# Patient Record
Sex: Male | Born: 1995
Health system: Southern US, Community
[De-identification: ages and names within clinical notes are randomized; demographics above are authoritative.]

## PROBLEM LIST (undated history)

## (undated) ENCOUNTER — Emergency Department (HOSPITAL_BASED_OUTPATIENT_CLINIC_OR_DEPARTMENT_OTHER): Admission: EM | Payer: No Typology Code available for payment source | Source: Home / Self Care

## (undated) DIAGNOSIS — G610 Guillain-Barre syndrome: Secondary | ICD-10-CM

## (undated) DIAGNOSIS — A549 Gonococcal infection, unspecified: Secondary | ICD-10-CM

---
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
## 2018-07-09 ENCOUNTER — Emergency Department (HOSPITAL_COMMUNITY)
Admission: EM | Admit: 2018-07-09 | Discharge: 2018-07-09 | Disposition: A | Discharge status: Home / Self Care | Payer: Medicaid Other | Attending: Emergency Medicine | Admitting: Emergency Medicine

## 2018-07-09 ENCOUNTER — Encounter (HOSPITAL_COMMUNITY): Payer: Self-pay | Admitting: Emergency Medicine

## 2018-07-09 ENCOUNTER — Other Ambulatory Visit: Payer: Self-pay

## 2018-07-09 DIAGNOSIS — R509 Fever, unspecified: Secondary | ICD-10-CM | POA: Diagnosis not present

## 2018-07-09 DIAGNOSIS — J111 Influenza due to unidentified influenza virus with other respiratory manifestations: Secondary | ICD-10-CM | POA: Insufficient documentation

## 2018-07-09 DIAGNOSIS — Z7722 Contact with and (suspected) exposure to environmental tobacco smoke (acute) (chronic): Secondary | ICD-10-CM | POA: Insufficient documentation

## 2018-07-09 DIAGNOSIS — R69 Illness, unspecified: Secondary | ICD-10-CM

## 2018-07-09 LAB — URINALYSIS, ROUTINE W REFLEX MICROSCOPIC
Bilirubin Urine: NEGATIVE
GLUCOSE, UA: NEGATIVE mg/dL
Hgb urine dipstick: NEGATIVE
Ketones, ur: NEGATIVE mg/dL
Leukocytes, UA: NEGATIVE
Nitrite: NEGATIVE
PH: 6 (ref 5.0–8.0)
Protein, ur: NEGATIVE mg/dL
SPECIFIC GRAVITY, URINE: 1.024 (ref 1.005–1.030)

## 2018-07-09 LAB — CBC WITH DIFFERENTIAL/PLATELET
Abs Immature Granulocytes: 0.02 10*3/uL (ref 0.00–0.07)
Basophils Absolute: 0 10*3/uL (ref 0.0–0.1)
Basophils Relative: 0 %
Eosinophils Absolute: 0 10*3/uL (ref 0.0–0.5)
Eosinophils Relative: 0 %
HEMATOCRIT: 45.4 % (ref 39.0–52.0)
HEMOGLOBIN: 15.4 g/dL (ref 13.0–17.0)
Immature Granulocytes: 0 %
LYMPHS PCT: 14 %
Lymphs Abs: 1.2 10*3/uL (ref 0.7–4.0)
MCH: 30.3 pg (ref 26.0–34.0)
MCHC: 33.9 g/dL (ref 30.0–36.0)
MCV: 89.2 fL (ref 80.0–100.0)
Monocytes Absolute: 0.6 10*3/uL (ref 0.1–1.0)
Monocytes Relative: 8 %
Neutro Abs: 6.5 10*3/uL (ref 1.7–7.7)
Neutrophils Relative %: 78 %
Platelets: 223 10*3/uL (ref 150–400)
RBC: 5.09 MIL/uL (ref 4.22–5.81)
RDW: 12.5 % (ref 11.5–15.5)
WBC: 8.4 10*3/uL (ref 4.0–10.5)
nRBC: 0 % (ref 0.0–0.2)

## 2018-07-09 LAB — COMPREHENSIVE METABOLIC PANEL
ALT: 16 U/L (ref 0–44)
AST: 20 U/L (ref 15–41)
Albumin: 4.1 g/dL (ref 3.5–5.0)
Alkaline Phosphatase: 90 U/L (ref 38–126)
Anion gap: 10 (ref 5–15)
BUN: 10 mg/dL (ref 6–20)
CO2: 25 mmol/L (ref 22–32)
Calcium: 9.1 mg/dL (ref 8.9–10.3)
Chloride: 103 mmol/L (ref 98–111)
Creatinine, Ser: 1.1 mg/dL (ref 0.61–1.24)
GFR calc Af Amer: >60 mL/min (ref >60)
GFR calc non Af Amer: >60 mL/min (ref >60)
GLUCOSE: 89 mg/dL (ref 70–99)
Potassium: 3.8 mmol/L (ref 3.5–5.1)
Sodium: 138 mmol/L (ref 135–145)
Total Bilirubin: 1.8 mg/dL — ABNORMAL HIGH (ref 0.3–1.2)
Total Protein: 7.6 g/dL (ref 6.5–8.1)

## 2018-07-09 MED ORDER — OSELTAMIVIR PHOSPHATE 75 MG PO CAPS: 75.0000 mg | ORAL_CAPSULE | Freq: Two times a day (BID) | ORAL | 0 refills | Status: DC

## 2018-07-09 MED ORDER — ACETAMINOPHEN 325 MG PO TABS
650.0000 mg | ORAL_TABLET | Freq: Once | ORAL | Status: AC
  Administered 2018-07-09: 650 mg via ORAL
  Filled 2018-07-09: qty 2

## 2018-07-09 MED ORDER — ONDANSETRON 4 MG PO TBDP: 4.0000 mg | ORAL_TABLET | Freq: Three times a day (TID) | ORAL | 0 refills | Status: DC | PRN

## 2018-07-09 MED ORDER — SODIUM CHLORIDE 0.9 % IV BOLUS
1000.0000 mL | Freq: Once | INTRAVENOUS | Status: AC
  Administered 2018-07-09: 1000 mL via INTRAVENOUS

## 2018-07-09 MED ORDER — ONDANSETRON HCL 4 MG/2ML IJ SOLN
4.0000 mg | Freq: Once | INTRAMUSCULAR | Status: AC
  Administered 2018-07-09: 4 mg via INTRAVENOUS
  Filled 2018-07-09: qty 2

## 2018-07-09 NOTE — ED Provider Notes (Signed)
[facility] Provider Note   CSN: [id number] Arrival date & time: 07/09/18  1608     History   Chief Complaint Chief Complaint  Patient presents with  . Fever    HPI Raoul Pitchyrese Kandler is a 22 y.o. male.  The history is provided by the patient and a relative. No language interpreter was used.  Fever     Mads Susette RacerHardy is a 22 y.o. male who presents to the Emergency Department complaining of fever, body aches. He began feeling poorly yesterday with generalized body aches, nausea, congestion in the back of his throat. He reports of fevers at home. No significant coughing, sore throat, vomiting, diarrhea, dysuria, constipation. He has had sick contacts, his mother had the flu. He has no known medical problems. He took Aleve for his symptoms yesterday, no medications today. Past Medical History:  Diagnosis Date  . [name] Fisher syndrome [facility](HCC)     There are no active problems to display for this patient.   History reviewed. No pertinent surgical history.      Home Medications    Prior to Admission medications   Medication Sig Start Date End Date Taking? Authorizing Provider  naproxen sodium (ALEVE) 220 MG tablet Take 220 mg by mouth 2 (two) times daily as needed (pain).   Yes [provider]  albuterol (PROVENTIL HFA;VENTOLIN HFA) 108 (90 BASE) MCG/ACT inhaler Inhale 2 puffs into the lungs every 4 (four) hours as needed for wheezing. Patient not taking: Reported on 07/09/2018 06/15/11 06/14/12  Esperanza SheetsSampson, Dawn M, PA-C  ondansetron (ZOFRAN ODT) 4 MG disintegrating tablet Take 1 tablet (4 mg total) by mouth every 8 (eight) hours as needed for nausea or vomiting. 07/09/18   Tilden Fossaees, Tyneisha Hegeman, MD  oseltamivir (TAMIFLU) 75 MG capsule Take 1 capsule (75 mg total) by mouth every 12 (twelve) hours. 07/09/18   Tilden Fossaees, Surina Storts, MD    Family History Family History  Problem Relation Age of Onset  . Asthma Mother     Social History Social  History   Tobacco Use  . Smoking status: Passive Smoke Exposure - Never Smoker  Substance Use Topics  . Alcohol use: No  . Drug use: Not on file     Allergies   Patient has no known allergies.   Review of Systems Review of Systems  Constitutional: Positive for fever.  All other systems reviewed and are negative.    Physical Exam Updated Vital Signs BP 113/61   Pulse 88   Temp (!) 103.1 F (39.5 C) (Oral)   Resp 16   SpO2 100%   Physical Exam Vitals signs and nursing note reviewed.  Constitutional:      Appearance: He is well-developed.  HENT:     Head: Normocephalic and atraumatic.     Right Ear: Tympanic membrane normal.     Left Ear: Tympanic membrane normal.     Mouth/Throat:     Mouth: Mucous membranes are moist.     Pharynx: No oropharyngeal exudate.  Cardiovascular:     Rate and Rhythm: Regular rhythm.     Heart sounds: No murmur.     Comments: Tachycardic Pulmonary:     Effort: Pulmonary effort is normal. No respiratory distress.     Breath sounds: Normal breath sounds.  Abdominal:     Tenderness: There is abdominal tenderness. There is no guarding or rebound.     Comments: Generalized abdominal tenderness, voluntary guarding  Musculoskeletal:        General: No tenderness.  Skin:    General: Skin is warm and dry.  Neurological:     Mental Status: He is alert and oriented to person, place, and time.  Psychiatric:        Behavior: Behavior normal.      ED Treatments / Results  Labs (all labs ordered are listed, but only abnormal results are displayed) Labs Reviewed  COMPREHENSIVE METABOLIC PANEL - Abnormal; Notable for the following components:      Result Value   Total Bilirubin 1.8 (*)    All other components within normal limits  CBC WITH DIFFERENTIAL/PLATELET  URINALYSIS, ROUTINE W REFLEX MICROSCOPIC    EKG None  Radiology No results found.  Procedures Procedures (including critical care time)  Medications Ordered in  ED Medications  sodium chloride 0.9 % bolus 1,000 mL (0 mLs Intravenous Stopped 07/09/18 1850)  ondansetron (ZOFRAN) injection 4 mg (4 mg Intravenous Given 07/09/18 1659)  acetaminophen (TYLENOL) tablet 650 mg (650 mg Oral Given 07/09/18 1659)     Initial Impression / Assessment and Plan / ED Course  I have reviewed the triage vital signs and the nursing notes.  Pertinent labs & imaging results that were available during my care of the patient were reviewed by me and considered in my medical decision making (see chart for details).     Patient here for evaluation of body aches, fevers, nausea, congestion, abdominal pain. He is non-toxic appearing on evaluation. On initial assessment he did have generalized abdominal tenderness, on recheck abdominal pain is completely resolved and abdomen is soft and nontender, he is tolerating oral fluids without difficulty. Presentation is not consistent with acute appendicitis, bowel obstruction, cholecystitis. Discussed with patient influenza like illness. Discussed home care, outpatient follow-up and return precautions.  Final Clinical Impressions(s) / ED Diagnoses   Final diagnoses:  Influenza-like illness    ED Discharge Orders         Ordered    oseltamivir (TAMIFLU) 75 MG capsule  Every 12 hours     07/09/18 1850    ondansetron (ZOFRAN ODT) 4 MG disintegrating tablet  Every 8 hours PRN     07/09/18 1850           Tilden Fossaees, Nilza Eaker, MD 07/09/18 1851

## 2018-07-09 NOTE — ED Notes (Signed)
ED Provider at bedside. 

## 2018-07-09 NOTE — ED Triage Notes (Signed)
Pt started to have body ache and headache last night. Pt temp 103.1 today

## 2018-07-09 NOTE — ED Notes (Signed)
Patient verbalizes understanding of discharge instructions. Opportunity for questioning and answers were provided. Armband removed by staff, pt discharged from ED ambulatory.   

## 2018-07-12 ENCOUNTER — Emergency Department (HOSPITAL_COMMUNITY)
Admission: EM | Admit: 2018-07-12 | Discharge: 2018-07-13 | Disposition: A | Discharge status: Home / Self Care | Payer: Medicaid Other | Attending: Emergency Medicine | Admitting: Emergency Medicine

## 2018-07-12 ENCOUNTER — Other Ambulatory Visit: Payer: Self-pay

## 2018-07-12 DIAGNOSIS — Z7722 Contact with and (suspected) exposure to environmental tobacco smoke (acute) (chronic): Secondary | ICD-10-CM | POA: Insufficient documentation

## 2018-07-12 DIAGNOSIS — J101 Influenza due to other identified influenza virus with other respiratory manifestations: Secondary | ICD-10-CM | POA: Insufficient documentation

## 2018-07-12 DIAGNOSIS — R6889 Other general symptoms and signs: Secondary | ICD-10-CM

## 2018-07-12 DIAGNOSIS — R509 Fever, unspecified: Secondary | ICD-10-CM | POA: Diagnosis present

## 2018-07-12 DIAGNOSIS — J111 Influenza due to unidentified influenza virus with other respiratory manifestations: Secondary | ICD-10-CM | POA: Diagnosis not present

## 2018-07-12 MED ORDER — ACETAMINOPHEN 500 MG PO TABS
1000.0000 mg | ORAL_TABLET | Freq: Once | ORAL | Status: AC
  Administered 2018-07-12: 1000 mg via ORAL
  Filled 2018-07-12: qty 2

## 2018-07-12 MED ORDER — ACETAMINOPHEN 500 MG PO TABS: 1000.0000 mg | ORAL_TABLET | Freq: Four times a day (QID) | ORAL | 0 refills | Status: DC | PRN

## 2018-07-12 NOTE — ED Provider Notes (Signed)
[facility] Provider Note   CSN: [id number] Arrival date & time: 07/12/18  1954     History   Chief Complaint Chief Complaint  Patient presents with  . Generalized Body Aches    HPI Dwayne Pitchyrese Sandoval is a 22 y.o. male.  Patient returns to ED after being seen on 07/09/18 and diagnosed with flu like illness. Family member states he is still running a fever, having significant aches and congestion. He is taking Tamiflu as prescribed. No nausea or vomiting. He is eating and drinking. He is taking ibuprofen 800 mg every 6 hours but the fever returns.   The history is provided by the patient. No language interpreter was used.    Past Medical History:  Diagnosis Date  . [name] Fisher syndrome [facility](HCC)     There are no active problems to display for this patient.   No past surgical history on file.      Home Medications    Prior to Admission medications   Medication Sig Start Date End Date Taking? Authorizing Provider  albuterol (PROVENTIL HFA;VENTOLIN HFA) 108 (90 BASE) MCG/ACT inhaler Inhale 2 puffs into the lungs every 4 (four) hours as needed for wheezing. Patient not taking: Reported on 07/09/2018 06/15/11 06/14/12  Melody ComasSampson, Dawn M, PA-C  naproxen sodium (ALEVE) 220 MG tablet Take 220 mg by mouth 2 (two) times daily as needed (pain).    [provider]  ondansetron (ZOFRAN ODT) 4 MG disintegrating tablet Take 1 tablet (4 mg total) by mouth every 8 (eight) hours as needed for nausea or vomiting. 07/09/18   Tilden Fossaees, Elizabeth, MD  oseltamivir (TAMIFLU) 75 MG capsule Take 1 capsule (75 mg total) by mouth every 12 (twelve) hours. 07/09/18   Tilden Fossaees, Elizabeth, MD    Family History Family History  Problem Relation Age of Onset  . Asthma Mother     Social History Social History   Tobacco Use  . Smoking status: Passive Smoke Exposure - Never Smoker  Substance Use Topics  . Alcohol use: No  . Drug use: Not on file     Allergies    Patient has no known allergies.   Review of Systems Review of Systems  Constitutional: Negative for appetite change, chills and fever.  HENT: Positive for congestion and rhinorrhea.   Respiratory: Positive for cough. Negative for shortness of breath.   Cardiovascular: Negative.   Gastrointestinal: Negative.  Negative for abdominal pain, diarrhea, nausea and vomiting.  Musculoskeletal: Positive for myalgias.  Skin: Negative.   Neurological: Negative.      Physical Exam Updated Vital Signs BP 105/73   Pulse (!) 102   Temp (!) 103 F (39.4 C) (Oral)   Resp 16   Ht 5\' 10"  (1.778 m)   Wt 68 kg   SpO2 98%   BMI 21.52 kg/m   Physical Exam Constitutional:      Appearance: He is well-developed.  HENT:     Head: Normocephalic.     Right Ear: Tympanic membrane normal.     Left Ear: Tympanic membrane normal.     Nose: Congestion present.     Mouth/Throat:     Mouth: Mucous membranes are moist.     Pharynx: Oropharynx is clear.  Eyes:     Conjunctiva/sclera: Conjunctivae normal.  Neck:     Musculoskeletal: Normal range of motion and neck supple.  Cardiovascular:     Rate and Rhythm: Normal rate and regular rhythm.     Heart sounds: No murmur.  Pulmonary:     Effort: Pulmonary effort is normal.     Breath sounds: Normal breath sounds. No wheezing, rhonchi or rales.  Abdominal:     General: Bowel sounds are normal.     Palpations: Abdomen is soft.     Tenderness: There is no abdominal tenderness. There is no guarding or rebound.  Musculoskeletal: Normal range of motion.     Comments: Full strength and ambulation.  Skin:    General: Skin is warm and dry.  Neurological:     Mental Status: He is alert and oriented to person, place, and time.      ED Treatments / Results  Labs (all labs ordered are listed, but only abnormal results are displayed) Labs Reviewed - No data to display  EKG None  Radiology No results found.  Procedures Procedures (including  critical care time)  Medications Ordered in ED Medications  acetaminophen (TYLENOL) tablet 1,000 mg (has no administration in time range)     Initial Impression / Assessment and Plan / ED Course  I have reviewed the triage vital signs and the nursing notes.  Pertinent labs & imaging results that were available during my care of the patient were reviewed by me and considered in my medical decision making (see chart for details).     Patient returns to the ED with persistent fever, body aches and congestion after being diagnosed with flu like illness and starting Tamiflu on 07/09/18.   He is ill appearing but nontoxic. Drinking and appear hydrated. Febrile on arrival. Will add Tylenol to his antipyretic regimen. Family reassured.   Final Clinical Impressions(s) / ED Diagnoses   Final diagnoses:  None   1. Flu like illness  ED Discharge Orders    None       Danne HarborUpstill, Arielle Eber, PA-C 07/12/18 2246    Terrilee FilesButler, Michael C, MD 07/13/18 1323

## 2018-07-12 NOTE — ED Triage Notes (Signed)
Patient states he was dx with the flu on December 25th and still has body aches, and continues to run a temperature.

## 2018-07-12 NOTE — Discharge Instructions (Addendum)
Give 600 mg (3 tablets) ibuprofen every 6 hours. If fever or aches persist during that 6 hour dose, given Tylenol 1000 mg (2 Extra Strength tablets) every 6 hours. Push fluids. See your doctor if symptoms continue.

## 2018-07-13 NOTE — ED Notes (Signed)
Patient verbalizes understanding of discharge instructions. Opportunity for questioning and answers were provided. Armband removed by staff, pt discharged from ED.  

## 2018-07-17 DIAGNOSIS — S9032XA Contusion of left foot, initial encounter: Secondary | ICD-10-CM | POA: Diagnosis not present

## 2018-07-19 ENCOUNTER — Emergency Department (HOSPITAL_COMMUNITY): Payer: Medicaid Other

## 2018-07-19 ENCOUNTER — Inpatient Hospital Stay (HOSPITAL_COMMUNITY)
Admission: EM | Admit: 2018-07-19 | Discharge: 2018-07-28 | DRG: 549 | Disposition: A | Discharge status: Home / Self Care | Payer: Medicaid Other | Attending: Internal Medicine | Admitting: Internal Medicine

## 2018-07-19 ENCOUNTER — Encounter (HOSPITAL_COMMUNITY): Payer: Self-pay

## 2018-07-19 ENCOUNTER — Other Ambulatory Visit: Payer: Self-pay

## 2018-07-19 DIAGNOSIS — Z7722 Contact with and (suspected) exposure to environmental tobacco smoke (acute) (chronic): Secondary | ICD-10-CM | POA: Diagnosis present

## 2018-07-19 DIAGNOSIS — L02511 Cutaneous abscess of right hand: Secondary | ICD-10-CM | POA: Diagnosis present

## 2018-07-19 DIAGNOSIS — M130 Polyarthritis, unspecified: Secondary | ICD-10-CM | POA: Diagnosis not present

## 2018-07-19 DIAGNOSIS — M7989 Other specified soft tissue disorders: Secondary | ICD-10-CM | POA: Diagnosis not present

## 2018-07-19 DIAGNOSIS — Z7251 High risk heterosexual behavior: Secondary | ICD-10-CM

## 2018-07-19 DIAGNOSIS — G610 Guillain-Barre syndrome: Secondary | ICD-10-CM | POA: Diagnosis present

## 2018-07-19 DIAGNOSIS — M79641 Pain in right hand: Secondary | ICD-10-CM | POA: Diagnosis not present

## 2018-07-19 DIAGNOSIS — A5486 Gonococcal sepsis: Secondary | ICD-10-CM

## 2018-07-19 DIAGNOSIS — A5442 Gonococcal arthritis: Principal | ICD-10-CM | POA: Diagnosis present

## 2018-07-19 DIAGNOSIS — M79672 Pain in left foot: Secondary | ICD-10-CM | POA: Diagnosis not present

## 2018-07-19 LAB — BASIC METABOLIC PANEL
Anion gap: 13 (ref 5–15)
BUN: 13 mg/dL (ref 6–20)
CO2: 25 mmol/L (ref 22–32)
Calcium: 9.2 mg/dL (ref 8.9–10.3)
Chloride: 100 mmol/L (ref 98–111)
Creatinine, Ser: 0.8 mg/dL (ref 0.61–1.24)
GFR calc Af Amer: >60 mL/min (ref >60)
GFR calc non Af Amer: >60 mL/min (ref >60)
Glucose, Bld: 91 mg/dL (ref 70–99)
Potassium: 3.9 mmol/L (ref 3.5–5.1)
Sodium: 138 mmol/L (ref 135–145)

## 2018-07-19 LAB — CBC
HCT: 42.7 % (ref 39.0–52.0)
Hemoglobin: 14.2 g/dL (ref 13.0–17.0)
MCH: 28.9 pg (ref 26.0–34.0)
MCHC: 33.3 g/dL (ref 30.0–36.0)
MCV: 87 fL (ref 80.0–100.0)
Platelets: 521 10*3/uL — ABNORMAL HIGH (ref 150–400)
RBC: 4.91 MIL/uL (ref 4.22–5.81)
RDW: 12.1 % (ref 11.5–15.5)
WBC: 13.9 10*3/uL — ABNORMAL HIGH (ref 4.0–10.5)
nRBC: 0 % (ref 0.0–0.2)

## 2018-07-19 LAB — SEDIMENTATION RATE: Sed Rate: 46 mm/hr — ABNORMAL HIGH (ref 0–16)

## 2018-07-19 LAB — C-REACTIVE PROTEIN: CRP: 22 mg/dL — ABNORMAL HIGH (ref <1.0)

## 2018-07-19 LAB — URIC ACID: Uric Acid, Serum: 3.9 mg/dL (ref 3.7–8.6)

## 2018-07-19 MED ORDER — HYDROCODONE-ACETAMINOPHEN 5-325 MG PO TABS
1.0000 | ORAL_TABLET | ORAL | Status: AC
  Administered 2018-07-19: 1 via ORAL
  Filled 2018-07-19: qty 1

## 2018-07-19 MED ORDER — SODIUM CHLORIDE 0.9 % IV SOLN
INTRAVENOUS | Status: AC
  Administered 2018-07-19 – 2018-07-20 (×2): via INTRAVENOUS

## 2018-07-19 MED ORDER — KETOROLAC TROMETHAMINE 15 MG/ML IJ SOLN
15.0000 mg | Freq: Two times a day (BID) | INTRAMUSCULAR | Status: DC | PRN
  Administered 2018-07-20: 15 mg via INTRAVENOUS
  Filled 2018-07-19: qty 1

## 2018-07-19 MED ORDER — ENOXAPARIN SODIUM 40 MG/0.4ML ~~LOC~~ SOLN
40.0000 mg | Freq: Every day | SUBCUTANEOUS | Status: DC
  Administered 2018-07-19 – 2018-07-27 (×9): 40 mg via SUBCUTANEOUS
  Filled 2018-07-19 (×9): qty 0.4

## 2018-07-19 MED ORDER — KETOROLAC TROMETHAMINE 30 MG/ML IJ SOLN
30.0000 mg | Freq: Once | INTRAMUSCULAR | Status: AC
  Administered 2018-07-19: 30 mg via INTRAVENOUS
  Filled 2018-07-19: qty 1

## 2018-07-19 MED ORDER — HYDROCODONE-ACETAMINOPHEN 5-325 MG PO TABS
1.0000 | ORAL_TABLET | ORAL | Status: DC | PRN
  Administered 2018-07-19 – 2018-07-21 (×5): 2 via ORAL
  Filled 2018-07-19 (×6): qty 2

## 2018-07-19 MED ORDER — IBUPROFEN 400 MG PO TABS
400.0000 mg | ORAL_TABLET | Freq: Four times a day (QID) | ORAL | Status: DC | PRN
  Administered 2018-07-20 – 2018-07-24 (×2): 400 mg via ORAL
  Filled 2018-07-19 (×2): qty 1

## 2018-07-19 NOTE — ED Notes (Signed)
Bed: [facility] Expected date:  Expected time:  Means of arrival:  Comments: 

## 2018-07-19 NOTE — ED Notes (Signed)
Phlebotomy at bedside attempting to attain Blood.

## 2018-07-19 NOTE — ED Notes (Signed)
ED provider at bedside.

## 2018-07-19 NOTE — H&P (Signed)
History and Physical  Lemoine Goyne NIO:[id number] DOB: 1995/11/01 DOA: 07/19/2018 1550  Referring physician: Hillard Danker [facility] ED) PCP: Peds, [facility] (Inactive)  Outpatient Specialists: none  HISTORY   Chief Complaint: R hand and L foot swelling and pain  HPI: Dwayne Sandoval is a 23 y.o. 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 Patient had reported generalized myalgias, subjective fevers in late December (about 2-3 weeks ago) and was treated empirically with Tamiflu. No hand or foot swelling noted at that time. Denies clear URI sx. His myalgias and subjective fever resolved with completion of Tamiflu and then on the following week on New Year's, patient noted left foot mild swelling and pain with ambulation. He went to urgent care who recommended ice/wrapping/elevation. A few days later, his right hand also started swelling, with pain with range of motion and tenderness to palpation. On day of admission, patient noted that his left foot and right hand were still markedly swollen and painful with movement + tender to touch, which prompted patient to seek hospital care.   Denies recent trauma to his hand or foot. Denies IV drug use. Reported unprotected anal-receptive sex (MSM) in December. States last HIV test was in July 2019 but has not been tested since then. He reports that aforementioned myalgias have resolved 2 weeks ago. No recent fevers or chills. No other sick contacts. No recent travel or bug bites. Of note, patient was recently incarcerated this summer and is currently on probation. No rashes.     Review of Systems:  + R hand and L foot swelling, pain and tenderness - no fevers/chills - no cough - no chest pain, dyspnea on exertion - no edema, PND, orthopnea - no nausea/vomiting; no tarry, melanotic or bloody  stools - no dysuria, increased urinary frequency - no weight changes  Rest of systems reviewed are negative, except as per above history.   ED course:  Vitals Blood pressure (!) 112/92, pulse 96, temperature 98.5 F (36.9 C), temperature source Oral, resp. rate 18, weight 68 kg, SpO2 100 %.  Received norco 5-325 mg x 1; toradol 48m IV x 1.   Past Medical History:  Diagnosis Date  . MIdamae Schullersyndrome ([facility]    History reviewed. No pertinent surgical history.  Social History:  reports that he is a non-smoker but has been exposed to tobacco smoke. He has never used smokeless tobacco. He reports current alcohol use. He reports that he does not use drugs. Occasional alcohol (ie 1 drink every 1-2 weeks).   No Known Allergies  Family History  Problem Relation Age of Onset  . Asthma Mother       Prior to Admission medications   Medication Sig Start Date End Date Taking? Authorizing Provider  acetaminophen (TYLENOL) 500 MG tablet Take 2 tablets (1,000 mg total) by mouth every 6 (six) hours as needed for mild pain, moderate pain, fever or headache. 07/12/18  Yes Upstill, Shari, PA-C  albuterol (PROVENTIL HFA;VENTOLIN HFA) 108 (90 BASE) MCG/ACT inhaler Inhale 2 puffs into the lungs every 4 (four) hours as needed for wheezing. Patient not taking: Reported on 07/09/2018 06/15/11 06/14/12  SKennith GainM, PA-C  ondansetron (ZOFRAN ODT) 4 MG disintegrating tablet Take 1 tablet (4 mg total) by mouth every 8 (eight) hours as needed for nausea  or vomiting. Patient not taking: Reported on 07/19/2018 07/09/18   Quintella Reichert, MD  oseltamivir (TAMIFLU) 75 MG capsule Take 1 capsule (75 mg total) by mouth every 12 (twelve) hours. Patient not taking: Reported on 07/19/2018 07/09/18   Quintella Reichert, MD    PHYSICAL EXAM   Temp:  [98.5 F (36.9 C)] 98.5 F (36.9 C) (01/04 1346) Pulse Rate:  [96] 96 (01/04 1346) Resp:  [18] 18 (01/04 1346) BP: (112)/(92) 112/92 (01/04 1346) SpO2:  [100 %] 100  % (01/04 1346) Weight:  [68 kg] 68 kg (01/04 1348)  BP (!) 112/92   Pulse 96   Temp 98.5 F (36.9 C) (Oral)   Resp 18   Wt 68 kg   SpO2 100%   BMI 21.51 kg/m    GEN thin young african-american male; resting in bed, mild discomfort due to hand and foot pain  HEENT NCAT EOM intact PERRL; clear oropharynx, no cervical LAD; dry mucus membranes  JVP estimated 5 cm H2O above RA; no HJR ; no carotid bruits b/l ;  CV regular normal rate; normal S1 and S2; 1/6 SEM at LUSB; no S3/S4; PMI non displaced; no parasternal heave  RESP CTA b/l; breathing unlabored and symmetric  ABD soft NT ND +normoactive BS  EXT warm throughout b/l; no peripheral edema b/l  PULSES  DP and radials 2+ intact b/l  SKIN/MSK no rashes  R hand: very warm to touch, swelling primarily over dorsum of hand, sparing DIPs. No rash. Tender over dorsum. Range of motion in fingers and wrist limited by diffuse pain.  L foot: warm to touch, swelling extending to ankle. Tender over entire foot.  Ankle monitor present over R ankle.  NEURO/PSYCH AAOx4; no focal deficits; R hand and L foot motor limited due to pain as above.    DATA   LABS ON ADMISSION:  Basic Metabolic Panel: Recent Labs  Lab 07/19/18 1824  NA 138  K 3.9  CL 100  CO2 25  GLUCOSE 91  BUN 13  CREATININE 0.80  CALCIUM 9.2   CBC: Recent Labs  Lab 07/19/18 1824  WBC 13.9*  HGB 14.2  HCT 42.7  MCV 87.0  PLT 521*   Liver Function Tests: No results for input(s): AST, ALT, ALKPHOS, BILITOT, PROT, ALBUMIN in the last 168 hours. No results for input(s): LIPASE, AMYLASE in the last 168 hours. No results for input(s): AMMONIA in the last 168 hours. Coagulation:  No results found for: INR, PROTIME No results found for: PTT Lactic Acid, Venous:  No results found for: LATICACIDVEN Cardiac Enzymes: No results for input(s): CKTOTAL, CKMB, CKMBINDEX, TROPONINI in the last 168 hours. Urinalysis:    Component Value Date/Time   COLORURINE YELLOW  07/09/2018 1814   APPEARANCEUR CLEAR 07/09/2018 1814   LABSPEC 1.024 07/09/2018 1814   PHURINE 6.0 07/09/2018 1814   GLUCOSEU NEGATIVE 07/09/2018 1814   HGBUR NEGATIVE 07/09/2018 1814   BILIRUBINUR NEGATIVE 07/09/2018 1814   KETONESUR NEGATIVE 07/09/2018 1814   PROTEINUR NEGATIVE 07/09/2018 1814   NITRITE NEGATIVE 07/09/2018 1814   LEUKOCYTESUR NEGATIVE 07/09/2018 1814    BNP (last 3 results) No results for input(s): PROBNP in the last 8760 hours. CBG: No results for input(s): GLUCAP in the last 168 hours.  Radiological Exams on Admission: Dg Hand Complete Right  Result Date: 07/19/2018 CLINICAL DATA:  Right hand pain and swelling.  No injury. EXAM: RIGHT HAND - COMPLETE 3+ VIEW COMPARISON:  None. FINDINGS: No fracture or bone lesion. Joints are normally spaced  and aligned.  No arthropathic changes. Normal soft tissues. IMPRESSION: Negative. Electronically Signed   By: Lajean Manes M.D.   On: 07/19/2018 16:49   Dg Foot Complete Left  Result Date: 07/19/2018 CLINICAL DATA:  Left foot pain and swelling.  No injury. EXAM: LEFT FOOT - COMPLETE 3+ VIEW COMPARISON:  None. FINDINGS: No fracture or bone lesion. Joints are normally spaced and aligned.  No arthropathic changes. Soft tissues are unremarkable. IMPRESSION: Negative. Electronically Signed   By: Lajean Manes M.D.   On: 07/19/2018 16:48    EKG: admission EKG pending  I have reviewed the patient's previous electronic chart records, labs, and other data.   ASSESSMENT AND PLAN   Assessment: Dionta Larke is a 23 y.o. 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 CRP elevated at 22 with ESR 46. Leukocytosis. R hand and L foot are warm and swollen.  Several historical factors present including recent flu-like illness  (treated empirically with tamiflu and since resolved), prior Idamae Schuller syndrome, although last episode was 4 years ago, the fact that he has anal-receptive sex with the last encounter reportedly in December being unprotected, and finally he was also strep A positive by cultures in 2016. Otherwise, patient denies family history of rheumatological illness or other episodes of hand/foot swelling. No trauma; denies IV drug use. In terms of timing of symptoms, infectious causes would be the mostly likely at this time: thus will pursue a more comprehensive infectious workup including blood cultures, HIV, GC/chlamydia, hepatitis B/C, RPR. Will also add to the rheum workup started in ED, including RF, anti ANA, anti-CCP. No real risk factors for Lyme, but will check lyme Abs as well. Pending initial workup and clinical symptoms will consider rheumatology consult. Favor holding off on steroids at this time, pending infectious workup. Exam is not consistent with septic joint at this time, and patient is otherwise non-toxic appearing.    Principal Problem:   Polyarthritis of multiple sites   Plan:   # Inflammatory polyarthritis of small joints (R hand and L foot swelling and pain). Infectious vs rheumatologic inflammatory polyarthritis vs other post-infectious sterile inflammatory arthritis (see above Assessment)   > initial plain films of R hand and L foot unrevealing. Elevated CRP and WBC.  - infectious workup: blood cx, UA, hepatitides, HIV, RPR, lyme - rheum screening: RF, CCP, ANA - uric acid level pending - start IV fluids NS at 125 cc/hr  - low threshold for broad spec abx if patient develops fever or focal infectious sx - hydrocodone-tylenol q4 prn and ibuprofen 425m q6h prn - spot dose toradol - pending workup consider rheum consultation inpatient or outpatient     DVT Prophylaxis: lovenox  Code Status:  Full Code Family Communication: patient and mother at bedside  Disposition Plan: floor  observation for labwork as above; pending clinical improvement and results may be admitted for further workup vs discharge home   Patient contact: Extended Emergency Contact Information [facility] Contact: [facility]Address: [address] [place] [address]UJohnnette Litterof ALittle [place]Phone: [phone]: Mother Secondary Emergency Contact: settle,angel Mobile Phone: [phone]: Aunt  Time spent: > 35 mins  CColbert Ewing MD [facility] Pager [phone] If 7PM-7AM, please  contact night-coverage [email or web address] Password [facility] 07/19/2018, 8:31 PM

## 2018-07-19 NOTE — ED Triage Notes (Signed)
Pt reports that he has been seen three times for similar symptoms. Pt reports left foot swelling with no injury, pt complains of rt hand swelling with no injury. Pt complains of generalized body aches. Pt denies fevers.

## 2018-07-19 NOTE — ED Provider Notes (Signed)
[facility]-EMERGENCY DEPT Provider Note   CSN: [id number] Arrival date & time: 07/19/18  1257     History   Chief Complaint Chief Complaint  Patient presents with  . Foot Swelling    HPI Charley Lafrance is a 23 y.o. male.  HPI Patient presented to the emergency room for evaluation of swelling in his hand and foot.  Patient started having a flulike illness about a week ago.  He was having fevers cough and congestion.  His symptoms persisted throughout the end of the month.  Patient noticed the last day or 2 however he started had swelling in his hand and foot.  They are very tender and it is hard for him to walk.  He denies any persistent fevers.  He denies any prior history of arthritis symptoms.  Patient denies any family history of arthritis. Past Medical History:  Diagnosis Date  . 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   History reviewed. No pertinent surgical history.      Home Medications    Prior to Admission medications   Medication Sig Start Date End Date Taking? Authorizing Provider  acetaminophen (TYLENOL) 500 MG tablet Take 2 tablets (1,000 mg total) by mouth every 6 (six) hours as needed for mild pain, moderate pain, fever or headache. 07/12/18  Yes Upstill, Shari, PA-C  albuterol (PROVENTIL HFA;VENTOLIN HFA) 108 (90 BASE) MCG/ACT inhaler Inhale 2 puffs into the lungs every 4 (four) hours as needed for wheezing. Patient not taking: Reported on 07/09/2018 06/15/11 06/14/12  Esperanza Sheets M, PA-C  ondansetron (ZOFRAN ODT) 4 MG disintegrating tablet Take 1 tablet (4 mg total) by mouth every 8 (eight) hours as needed for nausea or vomiting. Patient not taking: Reported on 07/19/2018 07/09/18   Tilden Fossa, MD  oseltamivir (TAMIFLU) 75 MG capsule Take 1 capsule (75 mg total) by mouth every 12 (twelve) hours. Patient not taking: Reported on 07/19/2018 07/09/18   Tilden Fossa, MD    Family  History Family History  Problem Relation Age of Onset  . Asthma Mother     Social History Social History   Tobacco Use  . Smoking status: Passive Smoke Exposure - Never Smoker  . Smokeless tobacco: Never Used  Substance Use Topics  . Alcohol use: Yes    Comment: occ  . Drug use: Never     Allergies   Patient has no known allergies.   Review of Systems Review of Systems  All other systems reviewed and are negative.    Physical Exam Updated Vital Signs BP (!) 112/92   Pulse 96   Temp 98.5 F (36.9 C) (Oral)   Resp 18   Wt 68 kg   SpO2 100%   BMI 21.51 kg/m   Physical Exam Vitals signs and nursing note reviewed.  Constitutional:      General: He is not in acute distress.    Appearance: He is well-developed.  HENT:     Head: Normocephalic and atraumatic.     Right Ear: External ear normal.     Left Ear: External ear normal.  Eyes:     General: No scleral icterus.       Right eye: No discharge.        Left eye: No discharge.     Conjunctiva/sclera: Conjunctivae normal.  Neck:     Musculoskeletal: Neck supple.     Trachea: No tracheal deviation.  Cardiovascular:  Rate and Rhythm: Normal rate and regular rhythm.  Pulmonary:     Effort: Pulmonary effort is normal. No respiratory distress.     Breath sounds: Normal breath sounds. No stridor. No wheezing or rales.  Abdominal:     General: Bowel sounds are normal. There is no distension.     Palpations: Abdomen is soft.     Tenderness: There is no abdominal tenderness. There is no guarding or rebound.  Musculoskeletal:     Right shoulder: He exhibits tenderness.     Right hand: He exhibits tenderness, bony tenderness and swelling.     Left foot: Tenderness, bony tenderness and swelling present.  Skin:    General: Skin is warm and dry.     Findings: No rash.  Neurological:     Mental Status: He is alert.     Cranial Nerves: No cranial nerve deficit (no facial droop, extraocular movements intact, no  slurred speech).     Sensory: No sensory deficit.     Motor: No abnormal muscle tone or seizure activity.     Coordination: Coordination normal.      ED Treatments / Results  Labs (all labs ordered are listed, but only abnormal results are displayed) Labs Reviewed  CBC - Abnormal; Notable for the following components:      Result Value   WBC 13.9 (*)    Platelets 521 (*)    All other components within normal limits  C-REACTIVE PROTEIN - Abnormal; Notable for the following components:   CRP 22.0 (*)    All other components within normal limits  BASIC METABOLIC PANEL  SEDIMENTATION RATE  RHEUMATOID FACTOR  ANTINUCLEAR ANTIBODIES, IFA    EKG None  Radiology Dg Hand Complete Right  Result Date: 07/19/2018 CLINICAL DATA:  Right hand pain and swelling.  No injury. EXAM: RIGHT HAND - COMPLETE 3+ VIEW COMPARISON:  None. FINDINGS: No fracture or bone lesion. Joints are normally spaced and aligned.  No arthropathic changes. Normal soft tissues. IMPRESSION: Negative. Electronically Signed   By: Amie Portlandavid  Ormond M.D.   On: 07/19/2018 16:49   Dg Foot Complete Left  Result Date: 07/19/2018 CLINICAL DATA:  Left foot pain and swelling.  No injury. EXAM: LEFT FOOT - COMPLETE 3+ VIEW COMPARISON:  None. FINDINGS: No fracture or bone lesion. Joints are normally spaced and aligned.  No arthropathic changes. Soft tissues are unremarkable. IMPRESSION: Negative. Electronically Signed   By: Amie Portlandavid  Ormond M.D.   On: 07/19/2018 16:48    Procedures Procedures (including critical care time)  Medications Ordered in ED Medications  ketorolac (TORADOL) 30 MG/ML injection 30 mg (has no administration in time range)  HYDROcodone-acetaminophen (NORCO/VICODIN) 5-325 MG per tablet 1 tablet (1 tablet Oral Given 07/19/18 1726)     Initial Impression / Assessment and Plan / ED Course  I have reviewed the triage vital signs and the nursing notes.  Pertinent labs & imaging results that were available during my  care of the patient were reviewed by me and considered in my medical decision making (see chart for details).   Patient has exam consistent with a polyarthritis.  He has significant swelling in his right hand and left foot.  Patient had a recent viral illness.  It is possible this might be post viral.  Lupus and rheumatoid are also a concern.  Patient has significant elevation in the CRP.  I will consult with the medical service to discuss whether he benefit for admission, initiation of steroids and close monitoring.  Final  Clinical Impressions(s) / ED Diagnoses   Final diagnoses:  Polyarthritis involving foot      Linwood Dibbles, MD 07/21/18 1225

## 2018-07-20 ENCOUNTER — Encounter (HOSPITAL_COMMUNITY): Payer: Self-pay | Admitting: *Deleted

## 2018-07-20 ENCOUNTER — Other Ambulatory Visit: Payer: Self-pay

## 2018-07-20 DIAGNOSIS — M130 Polyarthritis, unspecified: Secondary | ICD-10-CM | POA: Diagnosis not present

## 2018-07-20 LAB — CBC WITH DIFFERENTIAL/PLATELET
Abs Immature Granulocytes: 0.06 10*3/uL (ref 0.00–0.07)
Basophils Absolute: 0 10*3/uL (ref 0.0–0.1)
Basophils Relative: 0 %
EOS ABS: 0.1 10*3/uL (ref 0.0–0.5)
Eosinophils Relative: 1 %
HCT: 37.5 % — ABNORMAL LOW (ref 39.0–52.0)
Hemoglobin: 12.3 g/dL — ABNORMAL LOW (ref 13.0–17.0)
Immature Granulocytes: 1 %
LYMPHS ABS: 2.1 10*3/uL (ref 0.7–4.0)
Lymphocytes Relative: 20 %
MCH: 29.6 pg (ref 26.0–34.0)
MCHC: 32.8 g/dL (ref 30.0–36.0)
MCV: 90.1 fL (ref 80.0–100.0)
Monocytes Absolute: 0.8 10*3/uL (ref 0.1–1.0)
Monocytes Relative: 8 %
Neutro Abs: 7.4 10*3/uL (ref 1.7–7.7)
Neutrophils Relative %: 70 %
Platelets: 455 10*3/uL — ABNORMAL HIGH (ref 150–400)
RBC: 4.16 MIL/uL — ABNORMAL LOW (ref 4.22–5.81)
RDW: 12.3 % (ref 11.5–15.5)
WBC: 10.4 10*3/uL (ref 4.0–10.5)
nRBC: 0 % (ref 0.0–0.2)

## 2018-07-20 LAB — HEPATIC FUNCTION PANEL
ALT: 31 U/L (ref 0–44)
AST: 37 U/L (ref 15–41)
Albumin: 3.1 g/dL — ABNORMAL LOW (ref 3.5–5.0)
Alkaline Phosphatase: 92 U/L (ref 38–126)
BILIRUBIN DIRECT: 0.2 mg/dL (ref 0.0–0.2)
Indirect Bilirubin: 0.7 mg/dL (ref 0.3–0.9)
Total Bilirubin: 0.9 mg/dL (ref 0.3–1.2)
Total Protein: 7.1 g/dL (ref 6.5–8.1)

## 2018-07-20 LAB — BASIC METABOLIC PANEL
Anion gap: 9 (ref 5–15)
BUN: 15 mg/dL (ref 6–20)
CALCIUM: 8.5 mg/dL — AB (ref 8.9–10.3)
CO2: 24 mmol/L (ref 22–32)
Chloride: 105 mmol/L (ref 98–111)
Creatinine, Ser: 0.89 mg/dL (ref 0.61–1.24)
GFR calc Af Amer: >60 mL/min (ref >60)
GFR calc non Af Amer: >60 mL/min (ref >60)
Glucose, Bld: 107 mg/dL — ABNORMAL HIGH (ref 70–99)
Potassium: 3.9 mmol/L (ref 3.5–5.1)
Sodium: 138 mmol/L (ref 135–145)

## 2018-07-20 LAB — HIV ANTIBODY (ROUTINE TESTING W REFLEX): HIV Screen 4th Generation wRfx: NONREACTIVE

## 2018-07-20 LAB — URINALYSIS, ROUTINE W REFLEX MICROSCOPIC
Bilirubin Urine: NEGATIVE
Glucose, UA: NEGATIVE mg/dL
Hgb urine dipstick: NEGATIVE
Ketones, ur: NEGATIVE mg/dL
Leukocytes, UA: NEGATIVE
NITRITE: NEGATIVE
Protein, ur: NEGATIVE mg/dL
Specific Gravity, Urine: 1.015 (ref 1.005–1.030)
pH: 6 (ref 5.0–8.0)

## 2018-07-20 LAB — MAGNESIUM: Magnesium: 2.3 mg/dL (ref 1.7–2.4)

## 2018-07-20 MED ORDER — ACETAMINOPHEN 325 MG PO TABS
650.0000 mg | ORAL_TABLET | ORAL | Status: DC | PRN
  Administered 2018-07-20 – 2018-07-28 (×5): 650 mg via ORAL
  Filled 2018-07-20 (×5): qty 2

## 2018-07-20 MED ORDER — KETOROLAC TROMETHAMINE 15 MG/ML IJ SOLN
15.0000 mg | Freq: Four times a day (QID) | INTRAMUSCULAR | Status: AC
  Administered 2018-07-20 – 2018-07-25 (×19): 15 mg via INTRAVENOUS
  Filled 2018-07-20 (×20): qty 1

## 2018-07-20 NOTE — Progress Notes (Signed)
[facility]                                                                              Patient Demographics  Dwayne Sandoval, is a 23 y.o. male, DOB - Dec 27, 1995, KZS:[id number]  Admit date - 07/19/2018   Admitting Physician Colbert Ewing, MD  Outpatient Primary MD for the patient is [facility], [facility] (Inactive)  Outpatient specialists:   LOS - 0  days   Medical records reviewed and are as summarized below:    Chief Complaint  Patient presents with  . Foot Swelling       Brief summary   Dwayne Sandoval is a 23 y.o. 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 Patient had reported generalized myalgias, subjective fevers in late December (about 2-3 weeks ago) and was treated empirically with Tamiflu. No hand or foot swelling noted at that time.  His myalgias and subjective fever resolved with completion of Tamiflu and then on the following week on New Year's, patient noted left foot mild swelling and pain with ambulation. He went to urgent care who recommended ice/wrapping/elevation. A few days later, his right hand also started swelling, with pain with range of motion and tenderness to palpation. On day of admission, patient noted that his left foot and right hand were still markedly swollen and painful with movement + tender to touch Denies recent trauma to his hand or foot. Denies IV drug use. Reported unprotected anal-receptive sex (MSM) in December. States last HIV test was in July 2019 but has not been tested since then. Assessment & Plan    Principal Problem: Acute inflammatory polyarthritis of multiple sites involving the right wrist and left ankle -Given acuity of the symptoms, less than 6 weeks, more likely infectious than rheumatological, recent flulike illness, MSM, denies any trauma or IV  drug use.  -Imagings negative for any fractures, CRP elevated, ESR mildly elevated, uric acid normal -HIV, RPR, acute hepatitis panel/HCV, anti-CCP, Lyme antibodies, GC, chlamydia probe (not collected yet), will check parvovirus, blood cultures, LFT's    -Continue pain control, added Toradol  Code Status: Full code DVT Prophylaxis: Lovenox Family Communication: Discussed in detail with the patient, all imaging results, lab results explained to the patient, mother and sister at the bedside   Disposition Plan:   Time Spent in minutes 40 minutes  Procedures:  None  Consultants:   None  Antimicrobials:      Medications  Scheduled Meds: . enoxaparin (LOVENOX) injection  40 mg Subcutaneous QHS   Continuous Infusions: PRN Meds:.HYDROcodone-acetaminophen, ibuprofen, ketorolac   Antibiotics   Anti-infectives (From admission, onward)   None        Subjective:   Dwayne Sandoval was seen and examined today.  Complaining of pain in the right wrist and left foot, swollen, erythematous.  Low-grade temp of 100 F. Patient denies dizziness, chest pain, shortness of breath, abdominal pain, N/V/D/C.  No focal neurological deficits.  Objective:   Vitals:  07/19/18 1346 07/19/18 1348 07/19/18 2126 07/20/18 0541  BP: (!) 112/92  111/63 (!) 110/59  Pulse: 96  85 82  Resp: '18  16 16  ' Temp: 98.5 F (36.9 C)  100 F (37.8 C) 99.5 F (37.5 C)  TempSrc: Oral  Oral Oral  SpO2: 100%  100% 99%  Weight:  68 kg 68 kg   Height:   '5\' 10"'  (1.778 m)     Intake/Output Summary (Last 24 hours) at 07/20/2018 1147 Last data filed at 07/20/2018 1057 Gross per 24 hour  Intake 1857.52 ml  Output 575 ml  Net 1282.52 ml     Wt Readings from Last 3 Encounters:  07/19/18 68 kg  07/12/18 68 kg  10/27/14 66.4 kg (63 %, Z= 0.33)*   * Growth percentiles are based on CDC (Boys, 2-20 Years) data.     Exam  General: Alert and oriented x 3, NAD  Eyes: PERRLA, EOMI, Anicteric Sclera,  HEENT:   Atraumatic, normocephalic, normal oropharynx  Cardiovascular: S1 S2 auscultated, no rubs, murmurs or gallops. Regular rate and rhythm.  Respiratory: Clear to auscultation bilaterally, no wheezing, rales or rhonchi  Gastrointestinal: Soft, nontender, nondistended, + bowel sounds  Ext: no pedal edema bilaterally  Neuro: No new deficits  Musculoskeletal: No digital cyanosis, clubbing  Skin: Right hand warm to touch, ROM decreased at the wrist, tender  Left foot warm to touch, swelling in the ankle area, tender.  Ankle monitor on the right ankle  Psych: Normal affect and demeanor, alert and oriented x3    Data Reviewed:  I have personally reviewed following labs and imaging studies  Micro Results No results found for this or any previous visit (from the past 240 hour(s)).  Radiology Reports Dg Hand Complete Right  Result Date: 07/19/2018 CLINICAL DATA:  Right hand pain and swelling.  No injury. EXAM: RIGHT HAND - COMPLETE 3+ VIEW COMPARISON:  None. FINDINGS: No fracture or bone lesion. Joints are normally spaced and aligned.  No arthropathic changes. Normal soft tissues. IMPRESSION: Negative. Electronically Signed   By: Lajean Manes M.D.   On: 07/19/2018 16:49   Dg Foot Complete Left  Result Date: 07/19/2018 CLINICAL DATA:  Left foot pain and swelling.  No injury. EXAM: LEFT FOOT - COMPLETE 3+ VIEW COMPARISON:  None. FINDINGS: No fracture or bone lesion. Joints are normally spaced and aligned.  No arthropathic changes. Soft tissues are unremarkable. IMPRESSION: Negative. Electronically Signed   By: Lajean Manes M.D.   On: 07/19/2018 16:48    Lab Data:  CBC: Recent Labs  Lab 07/19/18 1824 07/20/18 0350  WBC 13.9* 10.4  NEUTROABS  --  7.4  HGB 14.2 12.3*  HCT 42.7 37.5*  MCV 87.0 90.1  PLT 521* 660*   Basic Metabolic Panel: Recent Labs  Lab 07/19/18 1824 07/20/18 0350  NA 138 138  K 3.9 3.9  CL 100 105  CO2 25 24  GLUCOSE 91 107*  BUN 13 15  CREATININE 0.80 0.89   CALCIUM 9.2 8.5*  MG  --  2.3   GFR: Estimated Creatinine Clearance: 127.3 mL/min (by C-G formula based on SCr of 0.89 mg/dL). Liver Function Tests: No results for input(s): AST, ALT, ALKPHOS, BILITOT, PROT, ALBUMIN in the last 168 hours. No results for input(s): LIPASE, AMYLASE in the last 168 hours. No results for input(s): AMMONIA in the last 168 hours. Coagulation Profile: No results for input(s): INR, PROTIME in the last 168 hours. Cardiac Enzymes: No results for input(s): CKTOTAL, CKMB, CKMBINDEX,  TROPONINI in the last 168 hours. BNP (last 3 results) No results for input(s): PROBNP in the last 8760 hours. HbA1C: No results for input(s): HGBA1C in the last 72 hours. CBG: No results for input(s): GLUCAP in the last 168 hours. Lipid Profile: No results for input(s): CHOL, HDL, LDLCALC, TRIG, CHOLHDL, LDLDIRECT in the last 72 hours. Thyroid Function Tests: No results for input(s): TSH, T4TOTAL, FREET4, T3FREE, THYROIDAB in the last 72 hours. Anemia Panel: No results for input(s): VITAMINB12, FOLATE, FERRITIN, TIBC, IRON, RETICCTPCT in the last 72 hours. Urine analysis:    Component Value Date/Time   COLORURINE YELLOW 07/20/2018 [place] 07/20/2018 1052   LABSPEC 1.015 07/20/2018 1052   PHURINE 6.0 07/20/2018 [place] 07/20/2018 [place] 07/20/2018 [employer] 07/20/2018 [place] 07/20/2018 1052   PROTEINUR NEGATIVE 07/20/2018 1052   NITRITE NEGATIVE 07/20/2018 [place] 07/20/2018 1052     Shardai Star M.D. [facility] 07/20/2018, 11:47 AM  Pager: [phone] Between 7am to 7pm - call Pager - [phone]  After 7pm go to [email or web address] - password [email or web address]  Call night coverage person covering after 7pm

## 2018-07-20 NOTE — Progress Notes (Signed)
Pt continues to be difficult with lab staff and often refusing labs until he is ready. rn explained to pt importance of these lab draws. rn also notified md of pt temp of 100.8. pt stable overall though remains very fearful of needles and blood draws.

## 2018-07-21 ENCOUNTER — Inpatient Hospital Stay (HOSPITAL_COMMUNITY): Payer: Medicaid Other

## 2018-07-21 DIAGNOSIS — L02511 Cutaneous abscess of right hand: Secondary | ICD-10-CM | POA: Diagnosis present

## 2018-07-21 DIAGNOSIS — Z7251 High risk heterosexual behavior: Secondary | ICD-10-CM | POA: Diagnosis not present

## 2018-07-21 DIAGNOSIS — G610 Guillain-Barre syndrome: Secondary | ICD-10-CM

## 2018-07-21 DIAGNOSIS — M7989 Other specified soft tissue disorders: Secondary | ICD-10-CM | POA: Diagnosis not present

## 2018-07-21 DIAGNOSIS — A5486 Gonococcal sepsis: Secondary | ICD-10-CM | POA: Diagnosis not present

## 2018-07-21 DIAGNOSIS — M130 Polyarthritis, unspecified: Secondary | ICD-10-CM | POA: Diagnosis not present

## 2018-07-21 DIAGNOSIS — R509 Fever, unspecified: Secondary | ICD-10-CM | POA: Diagnosis not present

## 2018-07-21 DIAGNOSIS — Z7722 Contact with and (suspected) exposure to environmental tobacco smoke (acute) (chronic): Secondary | ICD-10-CM

## 2018-07-21 DIAGNOSIS — L02419 Cutaneous abscess of limb, unspecified: Secondary | ICD-10-CM | POA: Diagnosis not present

## 2018-07-21 DIAGNOSIS — A5442 Gonococcal arthritis: Secondary | ICD-10-CM | POA: Diagnosis not present

## 2018-07-21 DIAGNOSIS — M009 Pyogenic arthritis, unspecified: Secondary | ICD-10-CM | POA: Diagnosis not present

## 2018-07-21 DIAGNOSIS — R5383 Other fatigue: Secondary | ICD-10-CM

## 2018-07-21 DIAGNOSIS — S96912A Strain of unspecified muscle and tendon at ankle and foot level, left foot, initial encounter: Secondary | ICD-10-CM | POA: Diagnosis not present

## 2018-07-21 DIAGNOSIS — A549 Gonococcal infection, unspecified: Secondary | ICD-10-CM | POA: Diagnosis not present

## 2018-07-21 DIAGNOSIS — R600 Localized edema: Secondary | ICD-10-CM | POA: Diagnosis not present

## 2018-07-21 LAB — CBC WITH DIFFERENTIAL/PLATELET
Abs Immature Granulocytes: 0.03 10*3/uL (ref 0.00–0.07)
BASOS ABS: 0 10*3/uL (ref 0.0–0.1)
Basophils Relative: 0 %
Eosinophils Absolute: 0 10*3/uL (ref 0.0–0.5)
Eosinophils Relative: 0 %
HCT: 36.4 % — ABNORMAL LOW (ref 39.0–52.0)
Hemoglobin: 11.8 g/dL — ABNORMAL LOW (ref 13.0–17.0)
IMMATURE GRANULOCYTES: 0 %
Lymphocytes Relative: 15 %
Lymphs Abs: 1.5 10*3/uL (ref 0.7–4.0)
MCH: 28.8 pg (ref 26.0–34.0)
MCHC: 32.4 g/dL (ref 30.0–36.0)
MCV: 88.8 fL (ref 80.0–100.0)
Monocytes Absolute: 0.7 10*3/uL (ref 0.1–1.0)
Monocytes Relative: 7 %
NEUTROS ABS: 7.8 10*3/uL — AB (ref 1.7–7.7)
NEUTROS PCT: 78 %
Platelets: 554 10*3/uL — ABNORMAL HIGH (ref 150–400)
RBC: 4.1 MIL/uL — ABNORMAL LOW (ref 4.22–5.81)
RDW: 12.2 % (ref 11.5–15.5)
WBC: 10.1 10*3/uL (ref 4.0–10.5)
nRBC: 0 % (ref 0.0–0.2)

## 2018-07-21 LAB — BASIC METABOLIC PANEL
ANION GAP: 9 (ref 5–15)
BUN: 12 mg/dL (ref 6–20)
CO2: 24 mmol/L (ref 22–32)
Calcium: 8.5 mg/dL — ABNORMAL LOW (ref 8.9–10.3)
Chloride: 105 mmol/L (ref 98–111)
Creatinine, Ser: 0.75 mg/dL (ref 0.61–1.24)
GFR calc Af Amer: >60 mL/min (ref >60)
GFR calc non Af Amer: >60 mL/min (ref >60)
Glucose, Bld: 103 mg/dL — ABNORMAL HIGH (ref 70–99)
Potassium: 4.2 mmol/L (ref 3.5–5.1)
Sodium: 138 mmol/L (ref 135–145)

## 2018-07-21 LAB — RHEUMATOID FACTOR: RHEUMATOID FACTOR: 14.9 [IU]/mL — AB (ref 0.0–13.9)

## 2018-07-21 LAB — ANTINUCLEAR ANTIBODIES, IFA: ANA Ab, IFA: NEGATIVE

## 2018-07-21 LAB — B. BURGDORFI ANTIBODIES: B burgdorferi Ab IgG+IgM: <0.91 {ISR} (ref 0.00–0.90)

## 2018-07-21 LAB — MAGNESIUM: Magnesium: 1.9 mg/dL (ref 1.7–2.4)

## 2018-07-21 MED ORDER — SODIUM CHLORIDE 0.9 % IV SOLN
INTRAVENOUS | Status: DC | PRN
  Administered 2018-07-21 – 2018-07-25 (×2): 500 mL via INTRAVENOUS
  Administered 2018-07-28: 250 mL via INTRAVENOUS

## 2018-07-21 MED ORDER — SODIUM CHLORIDE 0.9 % IV SOLN
2.0000 g | INTRAVENOUS | Status: DC
  Administered 2018-07-21 – 2018-07-28 (×8): 2 g via INTRAVENOUS
  Filled 2018-07-21 (×3): qty 2
  Filled 2018-07-21: qty 20
  Filled 2018-07-21: qty 2
  Filled 2018-07-21 (×2): qty 20
  Filled 2018-07-21 (×2): qty 2

## 2018-07-21 NOTE — Consult Note (Signed)
[facility]  Total days of antibiotics 2       Reason for Consult: polyarticular arthritis in sexually active person   Referring Physician: regalado  Principal Problem:   Polyarthritis of multiple sites    HPI: Dwayne Sandoval is a [age] y.o. male with remote hx of Gullain Barre syndrome who presents with 1 week history of flu like illness and initially went to ED for evaluation and sent home then started to develop polyarticular pain to left ankle and right wrist. He does report that his mother had flu ([facility]) the week prior to his symptoms. He reports recent unprotected sex, receptive partner. MSM. Started on ceftriaxone. Testing pending. Not on PrEP. No hx of STI. No rash, penile or anal drainage.  Lab Results  Component Value Date   ESRSEDRATE 46 (H) 07/19/2018   Lab Results  Component Value Date   CRP 22.0 (H) 07/19/2018     Past Medical History:  Diagnosis Date  . 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 enoxaparin (LOVENOX) injection  40 mg Subcutaneous QHS  . ketorolac  15 mg Intravenous Q6H    Social History   Tobacco Use  . Smoking status: Passive Smoke Exposure - Never Smoker  . Smokeless tobacco: Never Used  Substance Use Topics  . Alcohol use: Yes    Comment: occ  . Drug use: Never    Family History  Problem Relation Age of Onset  . Asthma Mother      Review of Systems  Constitutional: + for fatigue, and fever, chills, but negative diaphoresis, activity change, appetite change, fatigue and unexpected weight change.  HENT: Negative for congestion, sore throat, rhinorrhea, sneezing, trouble swallowing and sinus pressure.  Eyes: Negative for photophobia and visual disturbance.  Respiratory: Negative for cough, chest tightness, shortness of breath, wheezing and stridor.  Cardiovascular: Negative for chest pain, palpitations and leg swelling.  Gastrointestinal: Negative for  nausea, vomiting, abdominal pain, diarrhea, constipation, blood in stool, abdominal distention and anal bleeding.  Genitourinary: Negative for dysuria, hematuria, flank pain and difficulty urinating.  Musculoskeletal: + arthritis. Negative for myalgias, back pain, joint swelling,and gait problem.  Skin: Negative for color change, pallor, rash and wound.  Neurological: Negative for dizziness, tremors, weakness and light-headedness.  Hematological: Negative for adenopathy. Does not bruise/bleed easily.  Psychiatric/Behavioral: Negative for behavioral problems, confusion, sleep disturbance, dysphoric mood, decreased concentration and agitation.     OBJECTIVE: Temp:  [98.5 F (36.9 C)-101.7 F (38.7 C)] 99.1 F (37.3 C) (01/06 1349) Pulse Rate:  [72-97] 72 (01/06 1349) Resp:  [14-18] 17 (01/06 1349) BP: (116-121)/(51-67) 116/51 (01/06 1349) SpO2:  [96 %-100 %] 99 % (01/06 1349) Physical Exam  Constitutional: He is oriented to person, place, and time. He appears well-developed and well-nourished. No distress.  HENT:  Mouth/Throat: Oropharynx is clear and moist. No oropharyngeal exudate.  Cardiovascular: Normal rate, regular rhythm and normal heart sounds. Exam reveals no gallop and no friction rub.  No murmur heard.  Pulmonary/Chest: Effort normal and breath sounds normal. No respiratory distress. He has no wheezes.  Abdominal: Soft. Bowel sounds are normal. He exhibits no distension. There is no tenderness.  Lymphadenopathy:  He has no cervical adenopathy.  Neurological: He is alert and oriented to person, place, and time.  Skin: Skin is warm and dry. No rash noted. No erythema.  ZOX:WRUEAVWExt:wearing ankle monitor. Pain with dorsi/plantar flexion of left ankle. Warm to touch,  not necessarily swollen. Slight swelling to right wrist,limited ROM 2/2 pain Psychiatric: He has a normal mood and affect. His behavior is normal.     LABS: Results for orders placed or performed during the hospital  encounter of 07/19/18 (from the past 48 hour(s))  CBC     Status: Abnormal   Collection Time: 07/19/18  6:24 PM  Result Value Ref Range   WBC 13.9 (H) 4.0 - 10.5 K/uL   RBC 4.91 4.22 - 5.81 MIL/uL   Hemoglobin 14.2 13.0 - 17.0 g/dL   HCT 64.4 03.4 - 74.2 %   MCV 87.0 80.0 - 100.0 fL   MCH 28.9 26.0 - 34.0 pg   MCHC 33.3 30.0 - 36.0 g/dL   RDW 59.5 63.8 - 75.6 %   Platelets 521 (H) 150 - 400 K/uL   nRBC 0.0 0.0 - 0.2 %    Comment: Performed at [facility], [address], [place], [place] [address]  Basic metabolic panel     Status: None   Collection Time: 07/19/18  6:24 PM  Result Value Ref Range   Sodium 138 135 - 145 mmol/L   Potassium 3.9 3.5 - 5.1 mmol/L   Chloride 100 98 - 111 mmol/L   CO2 25 22 - 32 mmol/L   Glucose, Bld 91 70 - 99 mg/dL   BUN 13 6 - 20 mg/dL   Creatinine, Ser 5.18 0.61 - 1.24 mg/dL   Calcium 9.2 8.9 - 84.1 mg/dL   GFR calc non Af Amer >60 >60 mL/min   GFR calc Af Amer >60 >60 mL/min   Anion gap 13 5 - 15    Comment: Performed at [facility], [address], [place], [place] [address]  Sedimentation rate     Status: Abnormal   Collection Time: 07/19/18  6:24 PM  Result Value Ref Range   Sed Rate 46 (H) 0 - 16 mm/hr    Comment: Performed at [facility], [address] [place], [place] [address]  Antinuclear Antibodies, IFA     Status: None   Collection Time: 07/19/18  6:24 PM  Result Value Ref Range   ANA Ab, IFA Negative     Comment: (NOTE)                                     Negative   <1:80                                     Borderline  1:80                                     Positive   >1:80 Performed At: [facility] [address] [place], [place] [id number] Jolene Schimke MD UK:[id number]   C-reactive protein     Status: Abnormal   Collection Time: 07/19/18  6:25 PM  Result Value Ref Range   CRP 22.0 (H) <1.0 mg/dL    Comment: Performed at [facility], [address], [place], [place] [address]  HIV antibody (Routine Testing)     Status: None   Collection Time: 07/19/18  8:59 PM  Result Value Ref Range   HIV Screen 4th Generation wRfx Non Reactive Non Reactive  Comment: (NOTE) Performed At: [facility] [address] [place], [place] [id number] Jolene SchimkeNagendra Sanjai MD WU:[id number]:[phone]   Uric acid     Status: None   Collection Time: 07/19/18  8:59 PM  Result Value Ref Range   Uric Acid, Serum 3.9 3.7 - 8.6 mg/dL    Comment: Performed at [facility], [address] [place], [place] [address]  Basic metabolic panel     Status: Abnormal   Collection Time: 07/20/18  3:50 AM  Result Value Ref Range   Sodium 138 135 - 145 mmol/L   Potassium 3.9 3.5 - 5.1 mmol/L   Chloride 105 98 - 111 mmol/L   CO2 24 22 - 32 mmol/L   Glucose, Bld 107 (H) 70 - 99 mg/dL   BUN 15 6 - 20 mg/dL   Creatinine, Ser 3.080.89 0.61 - 1.24 mg/dL   Calcium 8.5 (L) 8.9 - 10.3 mg/dL   GFR calc non Af Amer >60 >60 mL/min   GFR calc Af Amer >60 >60 mL/min   Anion gap 9 5 - 15    Comment: Performed at [facility], [address] [place], [place] [address]  CBC with Differential/Platelet     Status: Abnormal   Collection Time: 07/20/18  3:50 AM  Result Value Ref Range   WBC 10.4 4.0 - 10.5 K/uL   RBC 4.16 (L) 4.22 - 5.81 MIL/uL   Hemoglobin 12.3 (L) 13.0 - 17.0 g/dL   HCT 69.637.5 (L) 29.539.0 - 28.452.0 %   MCV 90.1 80.0 - 100.0 fL   MCH 29.6 26.0 - 34.0 pg   MCHC 32.8 30.0 - 36.0 g/dL   RDW 13.212.3 44.011.5 - 10.215.5 %   Platelets 455 (H) 150 - 400 K/uL   nRBC 0.0 0.0 - 0.2 %   Neutrophils Relative % 70 %   Neutro Abs 7.4 1.7 - 7.7 K/uL   Lymphocytes Relative 20 %   Lymphs Abs 2.1 0.7 - 4.0 K/uL   Monocytes Relative 8 %   Monocytes Absolute 0.8 0.1 - 1.0 K/uL   Eosinophils Relative 1 %   Eosinophils Absolute 0.1 0.0 - 0.5 K/uL   Basophils Relative 0 %   Basophils Absolute 0.0 0.0 - 0.1 K/uL   Immature Granulocytes 1 %    Abs Immature Granulocytes 0.06 0.00 - 0.07 K/uL    Comment: Performed at [facility], [address] [place], [place] [address]  Magnesium     Status: None   Collection Time: 07/20/18  3:50 AM  Result Value Ref Range   Magnesium 2.3 1.7 - 2.4 mg/dL    Comment: Performed at [facility], [address] [place], [place] [address]  Urinalysis, Routine w reflex microscopic     Status: None   Collection Time: 07/20/18 10:52 AM  Result Value Ref Range   Color, Urine YELLOW YELLOW   APPearance CLEAR CLEAR   Specific Gravity, Urine 1.015 1.005 - 1.030   pH 6.0 5.0 - 8.0   Glucose, UA NEGATIVE NEGATIVE mg/dL   Hgb urine dipstick NEGATIVE NEGATIVE   Bilirubin Urine NEGATIVE NEGATIVE   Ketones, ur NEGATIVE NEGATIVE mg/dL   Protein, ur NEGATIVE NEGATIVE mg/dL   Nitrite NEGATIVE NEGATIVE   Leukocytes, UA NEGATIVE NEGATIVE    Comment: Performed at [facility], [address] [place], [place] [address]  Culture, blood (routine x 2)     Status: None (Preliminary result)   Collection Time: 07/20/18  2:48 PM  Result Value Ref Range   Specimen Description BLOOD RIGHT ANTECUBITAL    Special Requests      AB Blood Culture adequate volume Performed at [facility], [address], [place], [place] [address]    Culture NO GROWTH < 24 HOURS    Report Status PENDING   Culture, blood (routine x 2)     Status: None (Preliminary result)   Collection Time: 07/20/18  2:48 PM  Result Value Ref Range   Specimen Description BLOOD LEFT ANTECUBITAL    Special Requests      BOTTLES DRAWN AEROBIC ONLY Blood Culture adequate volume Performed at [facility], [address] [place], [place] [address]    Culture NO GROWTH < 24 HOURS    Report Status PENDING   Hepatic function panel     Status: Abnormal   Collection Time: 07/20/18  2:48 PM  Result Value Ref Range   Total Protein 7.1 6.5 - 8.1 g/dL   Albumin 3.1  (L) 3.5 - 5.0 g/dL   AST 37 15 - 41 U/L   ALT 31 0 - 44 U/L   Alkaline Phosphatase 92 38 - 126 U/L   Total Bilirubin 0.9 0.3 - 1.2 mg/dL   Bilirubin, Direct 0.2 0.0 - 0.2 mg/dL   Indirect Bilirubin 0.7 0.3 - 0.9 mg/dL    Comment: Performed at [facility], [address] [place], [place] [address]  Basic metabolic panel     Status: Abnormal   Collection Time: 07/21/18  3:59 AM  Result Value Ref Range   Sodium 138 135 - 145 mmol/L   Potassium 4.2 3.5 - 5.1 mmol/L   Chloride 105 98 - 111 mmol/L   CO2 24 22 - 32 mmol/L   Glucose, Bld 103 (H) 70 - 99 mg/dL   BUN 12 6 - 20 mg/dL   Creatinine, Ser 9.14 0.61 - 1.24 mg/dL   Calcium 8.5 (L) 8.9 - 10.3 mg/dL   GFR calc non Af Amer >60 >60 mL/min   GFR calc Af Amer >60 >60 mL/min   Anion gap 9 5 - 15    Comment: Performed at [facility], [address], [place], [place] [address]  CBC with Differential/Platelet     Status: Abnormal   Collection Time: 07/21/18  3:59 AM  Result Value Ref Range   WBC 10.1 4.0 - 10.5 K/uL   RBC 4.10 (L) 4.22 - 5.81 MIL/uL   Hemoglobin 11.8 (L) 13.0 - 17.0 g/dL   HCT 62.1 (L) 30.8 - 65.7 %   MCV 88.8 80.0 - 100.0 fL   MCH 28.8 26.0 - 34.0 pg   MCHC 32.4 30.0 - 36.0 g/dL   RDW 84.6 96.2 - 95.2 %   Platelets 554 (H) 150 - 400 K/uL   nRBC 0.0 0.0 - 0.2 %   Neutrophils Relative % 78 %   Neutro Abs 7.8 (H) 1.7 - 7.7 K/uL   Lymphocytes Relative 15 %   Lymphs Abs 1.5 0.7 - 4.0 K/uL   Monocytes Relative 7 %   Monocytes Absolute 0.7 0.1 - 1.0 K/uL   Eosinophils Relative 0 %   Eosinophils Absolute 0.0 0.0 - 0.5 K/uL   Basophils Relative 0 %   Basophils Absolute 0.0 0.0 - 0.1 K/uL   Immature Granulocytes 0 %   Abs Immature Granulocytes 0.03 0.00 - 0.07 K/uL    Comment: Performed at [facility], [address] [place], [place] [address]  Magnesium     Status: None   Collection Time: 07/21/18  3:59 AM  Result Value Ref Range   Magnesium 1.9 1.7 - 2.4  mg/dL    Comment: Performed at [facility], [address], [place], [place] [address]    MICRO: reviewed IMAGING: Dg Hand Complete Right  Result Date: 07/19/2018 CLINICAL DATA:  Right hand pain and swelling.  No injury. EXAM: RIGHT HAND - COMPLETE 3+ VIEW COMPARISON:  None. FINDINGS: No fracture or bone lesion. Joints are normally spaced and aligned.  No arthropathic changes. Normal soft tissues. IMPRESSION: Negative. Electronically Signed   By: Amie Portland M.D.   On: 07/19/2018 16:49   Dg Foot Complete Left  Result Date: 07/19/2018 CLINICAL DATA:  Left foot pain and swelling.  No injury. EXAM: LEFT FOOT - COMPLETE 3+ VIEW COMPARISON:  None. FINDINGS: No fracture or bone lesion. Joints are normally spaced and aligned.  No arthropathic changes. Soft tissues are unremarkable. IMPRESSION: Negative. Electronically Signed   By: Amie Portland M.D.   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

## 2018-07-21 NOTE — Plan of Care (Signed)
  Problem: Education: Goal: Knowledge of General Education information will improve Description: Including pain rating scale, medication(s)/side effects and non-pharmacologic comfort measures Outcome: Progressing   Problem: Health Behavior/Discharge Planning: Goal: Ability to manage health-related needs will improve Outcome: Progressing   Problem: Clinical Measurements: Goal: Ability to maintain clinical measurements within normal limits will improve Outcome: Progressing Goal: Will remain free from infection Outcome: Progressing Goal: Diagnostic test results will improve Outcome: Progressing Goal: Respiratory complications will improve Outcome: Progressing Goal: Cardiovascular complication will be avoided Outcome: Progressing   Problem: Coping: Goal: Level of anxiety will decrease Outcome: Progressing   Problem: Pain Managment: Goal: General experience of comfort will improve Outcome: Progressing   

## 2018-07-21 NOTE — Progress Notes (Signed)
Pt has an ankle monitor on, unable to perform MRI.

## 2018-07-21 NOTE — Progress Notes (Addendum)
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  Patient has experienced generalized myalgia and fever.  Was treated with Tamiflu.  Patient presented with worsening swelling of the right hand and left foot. Reported unprotected anal-receptive sex (MSM) in December. Assessment & Plan:   Principal Problem:   Polyarthritis of multiple sites   1-Acute inflammatory polyarthritis; Right hand and left foot. Patient spiked fever at 101. I am concerned this is in infectious process. GC and chlamydia and urine pending. ESR and CRP elevated.  Uric acid normal.  HIV negative. RPR, hepatitis panel and Lyme antibodies pending. I will get an MRI of the right hand, very swollen. I will start empirically ceftriaxone. Pain management. ID consulted  Estimated body mass index is 21.51 kg/m as calculated from the following:   Height as of this encounter: _0  (1.778 m).   Weight as of this encounter: 68 kg.   DVT prophylaxis: Lovenox Code Status: Full code Family Communication: Care discussed with patient Disposition Plan: Remain in the hospital, for IV antibiotics and awaiting to complete work-up.  Consultants:   none   Procedures:   None   Antimicrobials:  Ceftriaxone   Subjective: Patient lying down in bed, he has significant right hand and left foot swelling.  This will probably limit his mobility. He was noted to have high fever this morning at 101. He is complaining of severe pain, he is requiring IV pain medication  Objective: Vitals:   07/20/18 1515 07/20/18 1624 07/20/18 2201 07/21/18 0401  BP:   121/67 116/64  Pulse:   90 97  Resp:   14 18  Temp: 100.2 F (37.9  C) 98.5 F (36.9 C) (!) 101.4 F (38.6 C) (!) 101.7 F (38.7 C)  TempSrc: Oral Oral Oral Oral  SpO2:   100% 96%  Weight:      Height:        Intake/Output Summary (Last 24 hours) at 07/21/2018 1244 Last data filed at 07/21/2018 1133 Gross per 24 hour  Intake 1939.04 ml  Output 1850 ml  Net 89.04 ml   Filed Weights   07/19/18 1348 07/19/18 2126  Weight: 68 kg 68 kg    Examination:  General exam: Appears calm and comfortable  Respiratory system: Clear to auscultation. Respiratory effort normal. Cardiovascular system: S1 & S2 heard, RRR. No JVD, murmurs, rubs, gallops or clicks. No pedal edema. Gastrointestinal system: Abdomen is nondistended, soft and nontender. No organomegaly or masses felt. Normal bowel sounds heard. Central nervous system: Alert and oriented. No focal neurological deficits. Extremities: Symmetric 5 x 5 power. Right hand with edema and very tender. Right foot with edema Skin: No rashes, lesions or ulcers Psychiatry: Judgement and insight appear normal. Mood & affect appropriate.     Data Reviewed: I have personally reviewed following labs and imaging studies  CBC: Recent Labs  Lab 07/19/18 1824 07/20/18 0350 07/21/18 0359  WBC 13.9* 10.4 10.1  NEUTROABS  --  7.4 7.8*  HGB 14.2 12.3* 11.8*  HCT 42.7 37.5* 36.4*  MCV 87.0 90.1 88.8  PLT 521* 455* 902*   Basic Metabolic Panel: Recent Labs  Lab 07/19/18 1824 07/20/18 0350 07/21/18 0359  NA 138 138 138  K 3.9 3.9 4.2  CL 100 105 105  CO2 _0 GLUCOSE 91 107* 103*  BUN _1 CREATININE 0.80 0.89 0.75  CALCIUM 9.2 8.5* 8.5*  MG  --  2.3 1.9   GFR: Estimated Creatinine Clearance: 141.7 mL/min (by C-G formula based on SCr of 0.75 mg/dL). Liver Function Tests: Recent Labs  Lab 07/20/18 1448  AST 37  ALT 31  ALKPHOS 92  BILITOT 0.9  PROT 7.1  ALBUMIN 3.1*   No results for input(s): LIPASE, AMYLASE in the last 168 hours. No results for input(s): AMMONIA in the last 168  hours. Coagulation Profile: No results for input(s): INR, PROTIME in the last 168 hours. Cardiac Enzymes: No results for input(s): CKTOTAL, CKMB, CKMBINDEX, TROPONINI in the last 168 hours. BNP (last 3 results) No results for input(s): PROBNP in the last 8760 hours. HbA1C: No results for input(s): HGBA1C in the last 72 hours. CBG: No results for input(s): GLUCAP in the last 168 hours. Lipid Profile: No results for input(s): CHOL, HDL, LDLCALC, TRIG, CHOLHDL, LDLDIRECT in the last 72 hours. Thyroid Function Tests: No results for input(s): TSH, T4TOTAL, FREET4, T3FREE, THYROIDAB in the last 72 hours. Anemia Panel: No results for input(s): VITAMINB12, FOLATE, FERRITIN, TIBC, IRON, RETICCTPCT in the last 72 hours. Sepsis Labs: No results for input(s): PROCALCITON, LATICACIDVEN in the last 168 hours.  No results found for this or any previous visit (from the past 240 hour(s)).       Radiology Studies: Dg Hand Complete Right  Result Date: 07/19/2018 CLINICAL DATA:  Right hand pain and swelling.  No injury. EXAM: RIGHT HAND - COMPLETE 3+ VIEW COMPARISON:  None. FINDINGS: No fracture or bone lesion. Joints are normally spaced and aligned.  No arthropathic changes. Normal soft tissues. IMPRESSION: Negative. Electronically Signed   By: Lajean Manes M.D.   On: 07/19/2018 16:49   Dg Foot Complete Left  Result Date: 07/19/2018 CLINICAL DATA:  Left foot pain and swelling.  No injury. EXAM: LEFT FOOT - COMPLETE 3+ VIEW COMPARISON:  None. FINDINGS: No fracture or bone lesion. Joints are normally spaced and aligned.  No arthropathic changes. Soft tissues are unremarkable. IMPRESSION: Negative. Electronically Signed   By: Lajean Manes M.D.   On: 07/19/2018 16:48        Scheduled Meds: . enoxaparin (LOVENOX) injection  40 mg Subcutaneous QHS  . ketorolac  15 mg Intravenous Q6H   Continuous Infusions: . cefTRIAXone (ROCEPHIN)  IV       LOS: 0 days    Time spent: 35 minutes.      Elmarie Shiley, MD [facility] Pager [phone]  If 7PM-7AM, please contact night-coverage [email or web address] Password [facility] 07/21/2018, 12:44 PM

## 2018-07-22 ENCOUNTER — Inpatient Hospital Stay (HOSPITAL_COMMUNITY): Payer: Medicaid Other

## 2018-07-22 LAB — CBC WITH DIFFERENTIAL/PLATELET
Abs Immature Granulocytes: 0.07 10*3/uL (ref 0.00–0.07)
Basophils Absolute: 0 10*3/uL (ref 0.0–0.1)
Basophils Relative: 0 %
Eosinophils Absolute: 0.1 10*3/uL (ref 0.0–0.5)
Eosinophils Relative: 1 %
HCT: 38.4 % — ABNORMAL LOW (ref 39.0–52.0)
Hemoglobin: 12.3 g/dL — ABNORMAL LOW (ref 13.0–17.0)
Immature Granulocytes: 1 %
Lymphocytes Relative: 21 %
Lymphs Abs: 2.3 10*3/uL (ref 0.7–4.0)
MCH: 29.6 pg (ref 26.0–34.0)
MCHC: 32 g/dL (ref 30.0–36.0)
MCV: 92.3 fL (ref 80.0–100.0)
Monocytes Absolute: 0.8 10*3/uL (ref 0.1–1.0)
Monocytes Relative: 7 %
NEUTROS ABS: 8 10*3/uL — AB (ref 1.7–7.7)
Neutrophils Relative %: 70 %
Platelets: 555 10*3/uL — ABNORMAL HIGH (ref 150–400)
RBC: 4.16 MIL/uL — ABNORMAL LOW (ref 4.22–5.81)
RDW: 12.3 % (ref 11.5–15.5)
WBC: 11.3 10*3/uL — ABNORMAL HIGH (ref 4.0–10.5)
nRBC: 0 % (ref 0.0–0.2)

## 2018-07-22 LAB — CYTOLOGY, (ORAL, ANAL, URETHRAL) ANCILLARY ONLY
Chlamydia: NEGATIVE
Chlamydia: NEGATIVE
NEISSERIA GONORRHEA: POSITIVE — AB
Neisseria Gonorrhea: NEGATIVE

## 2018-07-22 LAB — MAGNESIUM: Magnesium: 2.2 mg/dL (ref 1.7–2.4)

## 2018-07-22 LAB — BASIC METABOLIC PANEL
Anion gap: 10 (ref 5–15)
BUN: 12 mg/dL (ref 6–20)
CO2: 23 mmol/L (ref 22–32)
Calcium: 9 mg/dL (ref 8.9–10.3)
Chloride: 103 mmol/L (ref 98–111)
Creatinine, Ser: 0.81 mg/dL (ref 0.61–1.24)
GFR calc Af Amer: >60 mL/min (ref >60)
GFR calc non Af Amer: >60 mL/min (ref >60)
Glucose, Bld: 86 mg/dL (ref 70–99)
Potassium: 5 mmol/L (ref 3.5–5.1)
Sodium: 136 mmol/L (ref 135–145)

## 2018-07-22 LAB — HEPATITIS B CORE ANTIBODY, IGM: Hep B C IgM: NEGATIVE

## 2018-07-22 LAB — HIV-1 RNA QUANT-NO REFLEX-BLD
HIV 1 RNA Quant: <20 copies/mL
LOG10 HIV-1 RNA: UNDETERMINED log10copy/mL

## 2018-07-22 LAB — HCV AB W REFLEX TO QUANT PCR: HCV Ab: 0.2 s/co ratio (ref 0.0–0.9)

## 2018-07-22 LAB — HCV INTERPRETATION

## 2018-07-22 LAB — HEPATITIS A ANTIBODY, IGM: Hep A IgM: NEGATIVE

## 2018-07-22 LAB — RPR: RPR Ser Ql: NONREACTIVE

## 2018-07-22 LAB — PARVOVIRUS B19 ANTIBODY, IGG AND IGM
PAROVIRUS B19 IGM ABS: 0.5 {index} (ref 0.0–0.8)
Parovirus B19 IgG Abs: 4 index — ABNORMAL HIGH (ref 0.0–0.8)

## 2018-07-22 MED ORDER — GADOBUTROL 1 MMOL/ML IV SOLN
7.0000 mL | Freq: Once | INTRAVENOUS | Status: AC | PRN
  Administered 2018-07-22: 7 mL via INTRAVENOUS

## 2018-07-22 NOTE — Progress Notes (Signed)
Pt to MRI via WC with transporter

## 2018-07-22 NOTE — Progress Notes (Signed)
Pt temp 100.8. on call provider notified per order via text. Tylenol given. Pt resting in bed comfortably, in no distress. Will continue to monitor. Jackelyn Knife RN 07/22/18 @ [phone]

## 2018-07-22 NOTE — Progress Notes (Signed)
Pt returned from MRI °

## 2018-07-22 NOTE — Plan of Care (Signed)
  Problem: Health Behavior/Discharge Planning: Goal: Ability to manage health-related needs will improve Outcome: Progressing   Problem: Clinical Measurements: Goal: Ability to maintain clinical measurements within normal limits will improve Outcome: Progressing Goal: Will remain free from infection Outcome: Progressing Goal: Diagnostic test results will improve Outcome: Progressing Goal: Respiratory complications will improve Outcome: Progressing   

## 2018-07-22 NOTE — Progress Notes (Signed)
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  Patient has experienced generalized myalgia and fever.  Was treated with Tamiflu.  Patient presented with worsening swelling of the right hand and left foot.  Reported unprotected anal-receptive sex (MSM) in December.  Patient admitted with acute polyarthritis, concern is for infectious process.  MRI of the right hand was ordered and is pending at this time.  Was a started on IV ceftriaxone to cover empirically for disseminated gonorrhea.   Assessment & Plan:   Principal Problem:   Polyarthritis of multiple sites   1-Acute inflammatory polyarthritis; Right hand and left foot. Patient spiked fever at 101. I am concerned this is an  infectious process. GC and chlamydia and urine pending. ESR and CRP elevated.  Uric acid normal.  HIV negative. RPR nonreactive, hepatitis panel negative hepatitis B core antibody pending and Lyme antibodies pending. Rheumatoid factor elevated at 14, ANA negative I will get an MRI of the right hand, very swollen. I will start empirically ceftriaxone. Pain management. ID consulted.  They agree with IV ceftriaxone.  Awaiting GC chlamydia urine. HIV viral load ordered.  Patient might be a good candidate for prophylaxis with PrEP.   Estimated body mass index is 21.51 kg/m as calculated from the following:   Height as of this encounter: '5\' 10"'  (1.778 m).   Weight as of this encounter: 68 kg.   DVT prophylaxis: Lovenox Code Status: Full code Family Communication: Care discussed with patient Disposition Plan: Remain in the hospital, for IV antibiotics and awaiting to complete work-up.  Consultants:    none   Procedures:   None   Antimicrobials:  Ceftriaxone   Subjective:  Down in bed, he still complaining of right hand pain although noticed some improvement. Complaining of left foot pain.  Objective: Vitals:   07/21/18 2113 07/22/18 0420 07/22/18 0650 07/22/18 1406  BP: 123/70 (!) 140/92  114/71  Pulse: 70 82  69  Resp: 18 16    Temp: 99.2 F (37.3 C) (!) 100.8 F (38.2 C) 98.8 F (37.1 C) 98.7 F (37.1 C)  TempSrc: Oral Oral Oral Oral  SpO2: 99% 99%  100%  Weight:      Height:        Intake/Output Summary (Last 24 hours) at 07/22/2018 1532 Last data filed at 07/22/2018 1443 Gross per 24 hour  Intake 714.26 ml  Output 1300 ml  Net -585.74 ml   Filed Weights   07/19/18 1348 07/19/18 2126  Weight: 68 kg 68 kg    Examination:  General exam: Lying down in bed Respiratory system: Clear to auscultation Cardiovascular system: S1, S2 regular rhythm and rate Gastrointestinal system; bowel sounds present soft nontender nondistended Central nervous system: Alert and oriented. No focal neurological deficits. Extremities: Symmetric power, right hand less tender to palpation, continue to have edema, left foot with edema.    Data Reviewed: I have personally reviewed following labs and imaging studies  CBC: Recent Labs  Lab 07/19/18 1824 07/20/18 0350 07/21/18 0359 07/22/18 0343  WBC 13.9* 10.4 10.1 11.3*  NEUTROABS  --  7.4 7.8* 8.0*  HGB 14.2 12.3* 11.8* 12.3*  HCT 42.7 37.5* 36.4* 38.4*  MCV 87.0 90.1 88.8  92.3  PLT 521* 455* 554* 701*   Basic Metabolic Panel: Recent Labs  Lab 07/19/18 1824 07/20/18 0350 07/21/18 0359 07/22/18 0343  NA 138 138 138 136  K 3.9 3.9 4.2 5.0  CL 100 105 105 103  CO2 '25 24 24 23  ' GLUCOSE 91 107* 103* 86  BUN '13 15 12 12  ' CREATININE 0.80 0.89 0.75 0.81  CALCIUM 9.2 8.5* 8.5* 9.0  MG  --  2.3 1.9 2.2   GFR: Estimated Creatinine Clearance: 139.9 mL/min (by C-G formula based on SCr of 0.81 mg/dL). Liver  Function Tests: Recent Labs  Lab 07/20/18 1448  AST 37  ALT 31  ALKPHOS 92  BILITOT 0.9  PROT 7.1  ALBUMIN 3.1*   No results for input(s): LIPASE, AMYLASE in the last 168 hours. No results for input(s): AMMONIA in the last 168 hours. Coagulation Profile: No results for input(s): INR, PROTIME in the last 168 hours. Cardiac Enzymes: No results for input(s): CKTOTAL, CKMB, CKMBINDEX, TROPONINI in the last 168 hours. BNP (last 3 results) No results for input(s): PROBNP in the last 8760 hours. HbA1C: No results for input(s): HGBA1C in the last 72 hours. CBG: No results for input(s): GLUCAP in the last 168 hours. Lipid Profile: No results for input(s): CHOL, HDL, LDLCALC, TRIG, CHOLHDL, LDLDIRECT in the last 72 hours. Thyroid Function Tests: No results for input(s): TSH, T4TOTAL, FREET4, T3FREE, THYROIDAB in the last 72 hours. Anemia Panel: No results for input(s): VITAMINB12, FOLATE, FERRITIN, TIBC, IRON, RETICCTPCT in the last 72 hours. Sepsis Labs: No results for input(s): PROCALCITON, LATICACIDVEN in the last 168 hours.  Recent Results (from the past 240 hour(s))  Culture, blood (routine x 2)     Status: None (Preliminary result)   Collection Time: 07/20/18  2:48 PM  Result Value Ref Range Status   Specimen Description   Final    BLOOD RIGHT ANTECUBITAL Performed at [facility] [address], [place], [place] [address]    Special Requests   Final    AB Blood Culture adequate volume Performed at [facility] [address], [place], [place] [address]    Culture   Final    NO GROWTH 2 DAYS Performed at [facility] [address], [place], [place] [address]    Report Status PENDING  Incomplete  Culture, blood (routine x 2)     Status: None (Preliminary result)   Collection Time: 07/20/18  2:48 PM  Result Value Ref Range Status   Specimen Description   Final    BLOOD LEFT ANTECUBITAL Performed at [facility] [address] [place], [place] [address]    Special Requests   Final    BOTTLES DRAWN AEROBIC ONLY Blood Culture adequate volume Performed at [facility] [address], [place], [place] [address]    Culture   Final    NO GROWTH 2 DAYS Performed at [facility] [address], [place], [place] [address]    Report Status PENDING  Incomplete         Radiology Studies: No results found.      Scheduled Meds: . enoxaparin (LOVENOX) injection  40 mg Subcutaneous QHS  . ketorolac  15 mg Intravenous Q6H   Continuous Infusions: . sodium chloride 10 mL/hr at 07/22/18 0600  . cefTRIAXone (ROCEPHIN)  IV 2 g (07/22/18 1215)     LOS: 1 day    Time spent: 35 minutes.  Elmarie Shiley, MD [facility] Pager [phone]  If 7PM-7AM, please contact night-coverage [email or web address] Password [facility] 07/22/2018, 3:32 PM

## 2018-07-23 DIAGNOSIS — L02419 Cutaneous abscess of limb, unspecified: Secondary | ICD-10-CM

## 2018-07-23 DIAGNOSIS — M009 Pyogenic arthritis, unspecified: Secondary | ICD-10-CM

## 2018-07-23 DIAGNOSIS — A549 Gonococcal infection, unspecified: Secondary | ICD-10-CM

## 2018-07-23 DIAGNOSIS — A5486 Gonococcal sepsis: Secondary | ICD-10-CM

## 2018-07-23 LAB — CBC WITH DIFFERENTIAL/PLATELET
Abs Immature Granulocytes: 0.03 10*3/uL (ref 0.00–0.07)
Basophils Absolute: 0 10*3/uL (ref 0.0–0.1)
Basophils Relative: 0 %
Eosinophils Absolute: 0.1 10*3/uL (ref 0.0–0.5)
Eosinophils Relative: 1 %
HCT: 39.6 % (ref 39.0–52.0)
HEMOGLOBIN: 13 g/dL (ref 13.0–17.0)
Immature Granulocytes: 0 %
LYMPHS PCT: 23 %
Lymphs Abs: 1.7 10*3/uL (ref 0.7–4.0)
MCH: 29.6 pg (ref 26.0–34.0)
MCHC: 32.8 g/dL (ref 30.0–36.0)
MCV: 90.2 fL (ref 80.0–100.0)
Monocytes Absolute: 0.4 10*3/uL (ref 0.1–1.0)
Monocytes Relative: 5 %
NEUTROS ABS: 5.3 10*3/uL (ref 1.7–7.7)
Neutrophils Relative %: 71 %
Platelets: 648 10*3/uL — ABNORMAL HIGH (ref 150–400)
RBC: 4.39 MIL/uL (ref 4.22–5.81)
RDW: 12 % (ref 11.5–15.5)
WBC: 7.5 10*3/uL (ref 4.0–10.5)
nRBC: 0 % (ref 0.0–0.2)

## 2018-07-23 LAB — BASIC METABOLIC PANEL
Anion gap: 10 (ref 5–15)
BUN: 14 mg/dL (ref 6–20)
CO2: 24 mmol/L (ref 22–32)
Calcium: 9.3 mg/dL (ref 8.9–10.3)
Chloride: 104 mmol/L (ref 98–111)
Creatinine, Ser: 0.77 mg/dL (ref 0.61–1.24)
GFR calc Af Amer: >60 mL/min (ref >60)
GFR calc non Af Amer: >60 mL/min (ref >60)
Glucose, Bld: 101 mg/dL — ABNORMAL HIGH (ref 70–99)
Potassium: 4.8 mmol/L (ref 3.5–5.1)
Sodium: 138 mmol/L (ref 135–145)

## 2018-07-23 LAB — MAGNESIUM: Magnesium: 2.3 mg/dL (ref 1.7–2.4)

## 2018-07-23 MED ORDER — AZITHROMYCIN 250 MG PO TABS
1000.0000 mg | ORAL_TABLET | Freq: Once | ORAL | Status: AC
  Administered 2018-07-23: 1000 mg via ORAL
  Filled 2018-07-23: qty 4

## 2018-07-23 MED ORDER — EMTRICITABINE-TENOFOVIR AF 200-25 MG PO TABS: 1.0000 | ORAL_TABLET | Freq: Every day | ORAL | 3 refills | Status: DC

## 2018-07-23 NOTE — Progress Notes (Signed)
[facility]    Date of Admission:  07/19/2018   Total days of antibiotics 3           ID: Dwayne Sandoval is a 23 y.o. male with  Principal Problem:   Polyarthritis of multiple sites    Subjective: Still having low grade fever. Small fluid collection above wrist joint noted on mri per my read.shivering since having shower. He still reports significant pain to left ankle difficult to place weight and ambulate on left ankle/foot  -leukocytosis improved. Medications:  . enoxaparin (LOVENOX) injection  40 mg Subcutaneous QHS  . ketorolac  15 mg Intravenous Q6H    Objective: Vital signs in last 24 hours: Temp:  [98.7 F (37.1 C)-100.1 F (37.8 C)] 98.7 F (37.1 C) (01/08 1334) Pulse Rate:  [61-74] 61 (01/08 1334) Resp:  [16] 16 (01/08 1334) BP: (126-132)/(78-81) 126/78 (01/08 1334) SpO2:  [91 %-100 %] 91 % (01/08 1334)  Physical Exam  Constitutional: He is oriented to person, place, and time. He appears well-developed and well-nourished. No distress.  HENT:  Mouth/Throat: Oropharynx is clear and moist. No oropharyngeal exudate.  Cardiovascular: Normal rate, regular rhythm and normal heart sounds. Exam reveals no gallop and no friction rub.  No murmur heard.  Pulmonary/Chest: Effort normal and breath sounds normal. No respiratory distress. He has no wheezes.  Abdominal: Soft. Bowel sounds are normal. He exhibits no distension. There is no tenderness.  Lymphadenopathy:  He has no cervical adenopathy.  Neurological: He is alert and oriented to person, place, and time.  Skin: Skin is warm and dry. No rash noted. No erythema.  Psychiatric: He has a normal mood and affect. His behavior is normal.     Lab Results Recent Labs    07/22/18 0343 07/23/18 0823  WBC 11.3* 7.5  HGB 12.3* 13.0  HCT 38.4* 39.6  NA 136 138  K 5.0 4.8  CL 103 104  CO2 23 24  BUN 12 14  CREATININE 0.81 0.77    Microbiology: reviewed Studies/Results: Mr Hand Right W  Wo Contrast  Result Date: 07/22/2018 CLINICAL DATA:  Hand swelling with infection suspected. EXAM: MRI OF THE RIGHT HAND WITHOUT AND WITH CONTRAST TECHNIQUE: Multiplanar, multisequence MR imaging of the right hand was performed before and after the administration of intravenous contrast. Imaging includes the distal carpal row to the PIP joint level of the second through fifth digits and the entirety of the thumb. CONTRAST:  7 mL Gadavist COMPARISON:  Radiographs from 07/19/2018 FINDINGS: Bones/Joint/Cartilage There is an enhancing dorsal fluid collection measuring approximately 2.4 x 1.1 x 0.7 cm in transverse by AP by craniocaudad dimension projecting over the radial aspect of the distal carpal row and scaphoid. This is interposed between the carpal bones and extensor tendons and raise concern for infected joint fluid, synovitis or abscess, series 7/5. Scattered faint marrow signal abnormalities are noted of the included carpal bones without frank bone destruction. No fractures identified. No suspicious osseous lesions. Ligaments Negative Muscles and Tendons No intramuscular edema, atrophy or evidence of pyomyositis. The flexor and extensor tendons crossing the wrist and hand are intact without tenosynovitis. Soft tissues No significant soft tissue swelling. IMPRESSION: Enhancing 2.4 x 1.1 x 0.7 cm fluid collection over the dorsum of the wrist as above described, more so along the radial aspect. Differential possibilities may include infected joint fluid, abscess, synovitis or combination thereof. Sampling of the fluid is recommended. Scattered faint nonspecific carpal bone marrow edema may be reactive  in etiology secondary to inflammatory arthropathy though a septic arthritis is not excluded. Electronically Signed   By: Tollie Eth M.D.   On: 07/22/2018 22:44     Assessment/Plan: disseminated gc with polyarticular arthritis Wrist abscess= recommend hand surgery to evaluate to see if can benefit from I x d.  Continue on ceftriaxone for  DGI  Left ankle pain/swelling = recommend mri to see if any fluid collection that would benefit from I x d  DGI = rectal swab positive for gonorrhea. Confirming suspicion that presentation is likely disseminated GC. Continue on ceftriaxone. Plan to treat for 7-14d depending on how he improves  High risk sex = will start on descovy for PrEP, for hiv prevention, first applying for patient assistance to help cover cost of medication. Will give a gram of azithromycin today to follow STI guidelines  [facility] for Infect[facility] Diseases Cell: [phone] Pager: [phone]  07/23/2018, 2:48 PM

## 2018-07-23 NOTE — Progress Notes (Signed)
Pharmacy - Brief Note  Pharmacy asked to investigate cost of descovy for PrEP for patient.  He has Medicaid.  Prescription sent to his pharmacy [facility]).  His copay will be $0 for the medication.  Juliette Alcide, PharmD, BCPS.   Work Cell: [phone] 07/23/2018 3:45 PM

## 2018-07-23 NOTE — Progress Notes (Signed)
PROGRESS NOTE  Dwayne Sandoval [id number]:[id number] DOB: April 01, 1996 DOA: 07/19/2018 PCP: [facility], [facility]  HPI/Recap of past 24 hours:  Fever coming down, t max 100.1 Right dorsal hand remain in pain, but  feeling better, less edema Continue to have significant left foot pain and edema, mostly in dorsal forefoot, not able to bear weight  Assessment/Plan: Principal Problem:   Polyarthritis of multiple sites   disseminated gc with polyarticular arthritis Blood culture no growth, HIV/RPR negative  On iv rocephin ID input appreciated  righ wrist pain, left foot pain -mri wrist and left foot   Code Status: full  Family Communication: patient   Disposition Plan: not ready to discharge   Consultants:  ID  Procedures:  none  Antibiotics:  rocephin   Objective: BP 126/78   Pulse 61   Temp 98.7 F (37.1 C)   Resp 16   Ht 5\' 10"  (1.778 m)   Wt 68 kg   SpO2 91%   BMI 21.51 kg/m   Intake/Output Summary (Last 24 hours) at 07/23/2018 1751 Last data filed at 07/23/2018 1515 Gross per 24 hour  Intake 1203.42 ml  Output 801 ml  Net 402.42 ml   Filed Weights   07/19/18 1348 07/19/18 2126  Weight: 68 kg 68 kg    Exam: Patient is examined daily including today on 07/23/2018, exams remain the same as of yesterday except that has changed    General:  NAD  Cardiovascular: RRR  Respiratory: CTABL  Abdomen: Soft/ND/NT, positive BS  Musculoskeletal: right wrist, dorsal hand tender, left fore foot dorsal edema, tender  Neuro: alert, oriented   Data Reviewed: Basic Metabolic Panel: Recent Labs  Lab 07/19/18 1824 07/20/18 0350 07/21/18 0359 07/22/18 0343 07/23/18 0823  NA 138 138 138 136 138  K 3.9 3.9 4.2 5.0 4.8  CL 100 105 105 103 104  CO2 25 24 24 23 24   GLUCOSE 91 107* 103* 86 101*  BUN 13 15 12 12 14   CREATININE 0.80 0.89 0.75 0.81 0.77  CALCIUM 9.2 8.5* 8.5* 9.0 9.3  MG  --  2.3 1.9 2.2 2.3   Liver Function Tests: Recent Labs  Lab  07/20/18 1448  AST 37  ALT 31  ALKPHOS 92  BILITOT 0.9  PROT 7.1  ALBUMIN 3.1*   No results for input(s): LIPASE, AMYLASE in the last 168 hours. No results for input(s): AMMONIA in the last 168 hours. CBC: Recent Labs  Lab 07/19/18 1824 07/20/18 0350 07/21/18 0359 07/22/18 0343 07/23/18 0823  WBC 13.9* 10.4 10.1 11.3* 7.5  NEUTROABS  --  7.4 7.8* 8.0* 5.3  HGB 14.2 12.3* 11.8* 12.3* 13.0  HCT 42.7 37.5* 36.4* 38.4* 39.6  MCV 87.0 90.1 88.8 92.3 90.2  PLT 521* 455* 554* 555* 648*   Cardiac Enzymes:   No results for input(s): CKTOTAL, CKMB, CKMBINDEX, TROPONINI in the last 168 hours. BNP (last 3 results) No results for input(s): BNP in the last 8760 hours.  ProBNP (last 3 results) No results for input(s): PROBNP in the last 8760 hours.  CBG: No results for input(s): GLUCAP in the last 168 hours.  Recent Results (from the past 240 hour(s))  Culture, blood (routine x 2)     Status: None (Preliminary result)   Collection Time: 07/20/18  2:48 PM  Result Value Ref Range Status   Specimen Description   Final    BLOOD RIGHT ANTECUBITAL Performed at [facility], [address] [place], [place] [address]  Special Requests   Final    AB Blood Culture adequate volume Performed at [facility], [address] [place], [place] [address]    Culture   Final    NO GROWTH 3 DAYS Performed at [facility], [address], [place], [place] [address]    Report Status PENDING  Incomplete  Culture, blood (routine x 2)     Status: None (Preliminary result)   Collection Time: 07/20/18  2:48 PM  Result Value Ref Range Status   Specimen Description   Final    BLOOD LEFT ANTECUBITAL Performed at [facility], [address] [place], [place] [address]    Special Requests   Final    BOTTLES DRAWN AEROBIC ONLY Blood Culture adequate volume Performed at [facility], [address] [place],  [place] [address]    Culture   Final    NO GROWTH 3 DAYS Performed at [facility], [address], [place], [place] [address]    Report Status PENDING  Incomplete     Studies: Mr Hand Right W Wo Contrast  Result Date: 07/22/2018 CLINICAL DATA:  Hand swelling with infection suspected. EXAM: MRI OF THE RIGHT HAND WITHOUT AND WITH CONTRAST TECHNIQUE: Multiplanar, multisequence MR imaging of the right hand was performed before and after the administration of intravenous contrast. Imaging includes the distal carpal row to the PIP joint level of the second through fifth digits and the entirety of the thumb. CONTRAST:  7 mL Gadavist COMPARISON:  Radiographs from 07/19/2018 FINDINGS: Bones/Joint/Cartilage There is an enhancing dorsal fluid collection measuring approximately 2.4 x 1.1 x 0.7 cm in transverse by AP by craniocaudad dimension projecting over the radial aspect of the distal carpal row and scaphoid. This is interposed between the carpal bones and extensor tendons and raise concern for infected joint fluid, synovitis or abscess, series 7/5. Scattered faint marrow signal abnormalities are noted of the included carpal bones without frank bone destruction. No fractures identified. No suspicious osseous lesions. Ligaments Negative Muscles and Tendons No intramuscular edema, atrophy or evidence of pyomyositis. The flexor and extensor tendons crossing the wrist and hand are intact without tenosynovitis. Soft tissues No significant soft tissue swelling. IMPRESSION: Enhancing 2.4 x 1.1 x 0.7 cm fluid collection over the dorsum of the wrist as above described, more so along the radial aspect. Differential possibilities may include infected joint fluid, abscess, synovitis or combination thereof. Sampling of the fluid is recommended. Scattered faint nonspecific carpal bone marrow edema may be reactive in etiology secondary to inflammatory arthropathy though a septic arthritis is not excluded. Electronically Signed    By: Tollie Eth M.D.   On: 07/22/2018 22:44    Scheduled Meds: . enoxaparin (LOVENOX) injection  40 mg Subcutaneous QHS  . ketorolac  15 mg Intravenous Q6H    Continuous Infusions: . sodium chloride Stopped (07/23/18 1515)  . cefTRIAXone (ROCEPHIN)  IV 2 g (07/23/18 1515)     Time spent: I have personally reviewed and interpreted on  07/23/2018 daily labs, imagings as discussed above under date review session and assessment and plans.  I reviewed all nursing notes, pharmacy notes, consultant notes,  vitals, pertinent old records  I have discussed plan of care as described above with RN , patient on 07/23/2018   Albertine Grates MD, PhD  [facility] Pager [phone]. If 7PM-7AM, please contact night-coverage at [email or web address], password [facility] 07/23/2018, 5:51 PM  LOS: 2 days

## 2018-07-23 NOTE — Progress Notes (Signed)
[facility]  [facility] Infusion Coordinator will follow pt hospital course with ID team to support Home Infusion Pharmacy services at DC if ordered.   If patient discharges after hours, please call [phone].   Sedalia Muta 07/23/2018, 7:40 AM

## 2018-07-24 ENCOUNTER — Inpatient Hospital Stay (HOSPITAL_COMMUNITY): Payer: Medicaid Other

## 2018-07-24 LAB — CBC WITH DIFFERENTIAL/PLATELET
Abs Immature Granulocytes: 0.01 10*3/uL (ref 0.00–0.07)
Basophils Absolute: 0 10*3/uL (ref 0.0–0.1)
Basophils Relative: 0 %
EOS PCT: 1 %
Eosinophils Absolute: 0.1 10*3/uL (ref 0.0–0.5)
HEMATOCRIT: 38.9 % — AB (ref 39.0–52.0)
Hemoglobin: 12.6 g/dL — ABNORMAL LOW (ref 13.0–17.0)
Immature Granulocytes: 0 %
Lymphocytes Relative: 22 %
Lymphs Abs: 1.3 10*3/uL (ref 0.7–4.0)
MCH: 28.9 pg (ref 26.0–34.0)
MCHC: 32.4 g/dL (ref 30.0–36.0)
MCV: 89.2 fL (ref 80.0–100.0)
MONO ABS: 0.4 10*3/uL (ref 0.1–1.0)
Monocytes Relative: 6 %
Neutro Abs: 4 10*3/uL (ref 1.7–7.7)
Neutrophils Relative %: 71 %
Platelets: 689 10*3/uL — ABNORMAL HIGH (ref 150–400)
RBC: 4.36 MIL/uL (ref 4.22–5.81)
RDW: 11.9 % (ref 11.5–15.5)
WBC: 5.7 10*3/uL (ref 4.0–10.5)
nRBC: 0 % (ref 0.0–0.2)

## 2018-07-24 LAB — BASIC METABOLIC PANEL
Anion gap: 6 (ref 5–15)
BUN: 18 mg/dL (ref 6–20)
CO2: 25 mmol/L (ref 22–32)
Calcium: 9.1 mg/dL (ref 8.9–10.3)
Chloride: 106 mmol/L (ref 98–111)
Creatinine, Ser: 0.81 mg/dL (ref 0.61–1.24)
GFR calc Af Amer: >60 mL/min (ref >60)
GFR calc non Af Amer: >60 mL/min (ref >60)
Glucose, Bld: 102 mg/dL — ABNORMAL HIGH (ref 70–99)
Potassium: 4.9 mmol/L (ref 3.5–5.1)
Sodium: 137 mmol/L (ref 135–145)

## 2018-07-24 LAB — MAGNESIUM: Magnesium: 2.3 mg/dL (ref 1.7–2.4)

## 2018-07-24 MED ORDER — GADOBUTROL 1 MMOL/ML IV SOLN
7.0000 mL | Freq: Once | INTRAVENOUS | Status: AC | PRN
  Administered 2018-07-24: 7 mL via INTRAVENOUS

## 2018-07-24 MED ORDER — EMTRICITABINE-TENOFOVIR AF 200-25 MG PO TABS
1.0000 | ORAL_TABLET | Freq: Every day | ORAL | Status: DC
  Administered 2018-07-24 – 2018-07-28 (×5): 1 via ORAL
  Filled 2018-07-24 (×5): qty 1

## 2018-07-24 NOTE — Progress Notes (Signed)
PROGRESS NOTE  Dwayne Sandoval [id number] DOB: [birth year] DOA: 07/19/2018 PCP: [facility]  HPI/Recap of past 24 hours:  No fever last 24hrs, reports feeling better,  Right dorsal hand less pain, less swelling, able to more right hand/wrist more Continue to have significant left foot pain , not able to bear weight, but dorsal forefoot edema has improved   Family at bedside with permission  Assessment/Plan: Principal Problem:   Polyarthritis of multiple sites Active Problems:   DGI (disseminated gonococcal infection) (HCC)   Disseminated gc with polyarticular arthritis Blood culture no growth, HIV/RPR negative  On iv rocephin ID input appreciated  righ wrist pain, left foot pain -mri wrist and left foot pending   Code Status: full  Family Communication: patient   Disposition Plan: not ready to discharge   Consultants:  ID  Procedures:  none  Antibiotics:  rocephin   Objective: BP 105/76 (BP Location: Left Arm)   Pulse (!) 59   Temp 98.2 F (36.8 C) (Oral)   Resp 16   Ht 5\' 10"  (1.778 m)   Wt 68 kg   SpO2 99%   BMI 21.51 kg/m   Intake/Output Summary (Last 24 hours) at 07/24/2018 2116 Last data filed at 07/24/2018 1723 Gross per 24 hour  Intake 1504 ml  Output 950 ml  Net 554 ml   Filed Weights   07/19/18 1348 07/19/18 2126  Weight: 68 kg 68 kg    Exam: Patient is examined daily including today on 07/24/2018, exams remain the same as of yesterday except that has changed    General:  NAD  Cardiovascular: RRR  Respiratory: CTABL  Abdomen: Soft/ND/NT, positive BS  Musculoskeletal: right wrist, dorsal hand tender, left dorsal forefoot  Edema has improved, tender  Neuro: alert, oriented   Data Reviewed: Basic Metabolic Panel: Recent Labs  Lab 07/20/18 0350 07/21/18 0359 07/22/18 0343 07/23/18 0823 07/24/18 0819  NA 138 138 136 138 137  K 3.9 4.2 5.0 4.8 4.9  CL 105 105 103 104 106  CO2 24 24 23 24 25     GLUCOSE 107* 103* 86 101* 102*  BUN 15 12 12 14 18   CREATININE 0.89 0.75 0.81 0.77 0.81  CALCIUM 8.5* 8.5* 9.0 9.3 9.1  MG 2.3 1.9 2.2 2.3 2.3   Liver Function Tests: Recent Labs  Lab 07/20/18 1448  AST 37  ALT 31  ALKPHOS 92  BILITOT 0.9  PROT 7.1  ALBUMIN 3.1*   No results for input(s): LIPASE, AMYLASE in the last 168 hours. No results for input(s): AMMONIA in the last 168 hours. CBC: Recent Labs  Lab 07/20/18 0350 07/21/18 0359 07/22/18 0343 07/23/18 0823 07/24/18 0819  WBC 10.4 10.1 11.3* 7.5 5.7  NEUTROABS 7.4 7.8* 8.0* 5.3 4.0  HGB 12.3* 11.8* 12.3* 13.0 12.6*  HCT 37.5* 36.4* 38.4* 39.6 38.9*  MCV 90.1 88.8 92.3 90.2 89.2  PLT 455* 554* 555* 648* 689*   Cardiac Enzymes:   No results for input(s): CKTOTAL, CKMB, CKMBINDEX, TROPONINI in the last 168 hours. BNP (last 3 results) No results for input(s): BNP in the last 8760 hours.  ProBNP (last 3 results) No results for input(s): PROBNP in the last 8760 hours.  CBG: No results for input(s): GLUCAP in the last 168 hours.  Recent Results (from the past 240 hour(s))  Culture, blood (routine x 2)     Status: None (Preliminary result)   Collection Time: 07/20/18  2:48 PM  Result Value Ref Range Status  Specimen Description   Final    BLOOD RIGHT ANTECUBITAL Performed at [facility], [address], [place], [place] [address]    Special Requests   Final    AB Blood Culture adequate volume Performed at [facility], [address] [place], [place] [address]    Culture   Final    NO GROWTH 4 DAYS Performed at [facility], [address], [place], [place] [address]    Report Status PENDING  Incomplete  Culture, blood (routine x 2)     Status: None (Preliminary result)   Collection Time: 07/20/18  2:48 PM  Result Value Ref Range Status   Specimen Description   Final    BLOOD LEFT ANTECUBITAL Performed at [facility], [address],  [place], [place] [address]    Special Requests   Final    BOTTLES DRAWN AEROBIC ONLY Blood Culture adequate volume Performed at [facility], [address] [place], [place] [address]    Culture   Final    NO GROWTH 4 DAYS Performed at [facility], [address], [place], [place] [address]    Report Status PENDING  Incomplete     Studies: Mr Foot Left W Wo Contrast  Result Date: 07/24/2018 CLINICAL DATA:  Left forefoot pain with difficulty bearing weight. Evaluate for possible sepsis. EXAM: MRI OF THE LEFT FOREFOOT WITHOUT AND WITH CONTRAST TECHNIQUE: Multiplanar, multisequence MR imaging of the left forefoot was performed both before and after administration of intravenous contrast. CONTRAST:  7 mL Gadavist IV COMPARISON:  Radiographs of the left foot dated 07/19/2018 FINDINGS: Bones/Joint/Cartilage Abnormal marrow edema of the mid foot involving the tarsal navicular, medial aspect of the cuboid, the cuneiforms and base second, third fourth metatarsals. No joint effusion or bone destruction. No acute fracture or erosive change. No marrow signal abnormality of the toes nor distal metatarsals. No joint dislocation is identified. Ligaments The Lisfranc ligament appears intact. Muscles and Tendons No evidence of muscle atrophy, mass or fluid collections. No evidence of pyomyositis. The tendons crossing the forefoot appear to be intact. Soft tissues Soft tissue swelling over the dorsum of the foot is identified. IMPRESSION: 1. Non specific bone marrow edema of the midfoot as above described without frank bone destruction nor apparent fracture. Findings may be secondary to an inflammatory arthropathy, calcium pyrophosphate deposition disease, or potentially early Charcot arthropathy in diabetic patient some considerations including overuse/stress response though the distribution would be somewhat unusual. No apparent joint effusion. No definite evidence of a septic arthropathy.  Clinical correlation suggested. 2. Mild soft tissue swelling over the dorsum foot without drainable fluid collection or abscess. No evidence of pyomyositis. Electronically Signed   By: Tollie Eth M.D.   On: 07/24/2018 19:44   Mr Ankle Left W Wo Contrast  Result Date: 07/24/2018 CLINICAL DATA:  Pain and difficulty weight-bearing. EXAM: MRI OF THE LEFT ANKLE WITHOUT AND WITH CONTRAST TECHNIQUE: Multiplanar, multisequence MR imaging of the ankle was performed before and after the administration of intravenous contrast. CONTRAST:  7 mL Gadavist IV COMPARISON:  Foot radiographs 07/19/2017 FINDINGS: TENDONS Peroneal: Intact and of normal signal intensity morphology. Posteromedial: Intact Anterior: Intact Achilles: Intact Plantar Fascia: Intact LIGAMENTS Lateral: Intact. Medial: Intact. CARTILAGE Ankle Joint: No joint effusion. Subtalar Joints/Sinus Tarsi: No joint effusion or chondral defect. Bones: Marrow edema of the midfoot as described on the forefoot MRI. These involve the cuboid, cuneiform, navicular and base second through fourth  metatarsals. Other: Mild dorsal soft tissue swelling of the included midfoot. IMPRESSION: IMPRESSION 1. Bone marrow edema of the midfoot. Differential considerations would an inflammatory arthropathy, early Charcot arthropathy, overuse, less likely septic arthropathy among some considerations. No definite fracture or suspicious osseous lesions. 2. Mild dorsal soft tissue swelling of the included midfoot without drainable fluid collection or abscess. 3. Intact tendons and ligaments crossing the ankle joint. Electronically Signed   By: Tollie Eth M.D.   On: 07/24/2018 19:48    Scheduled Meds: . emtricitabine-tenofovir AF  1 tablet Oral Daily  . enoxaparin (LOVENOX) injection  40 mg Subcutaneous QHS  . ketorolac  15 mg Intravenous Q6H    Continuous Infusions: . sodium chloride Stopped (07/23/18 1515)  . cefTRIAXone (ROCEPHIN)  IV 2 g (07/24/18 1245)     Time spent:  I have personally reviewed and interpreted on  07/24/2018 daily labs, imagings as discussed above under date review session and assessment and plans.  I reviewed all nursing notes, pharmacy notes, consultant notes,  vitals, pertinent old records  I have discussed plan of care as described above with RN , patient and family on 07/24/2018   Albertine Grates MD, PhD  [facility] Pager [phone]. If 7PM-7AM, please contact night-coverage at [email or web address], password [facility] 07/24/2018, 9:16 PM  LOS: 3 days

## 2018-07-24 NOTE — Progress Notes (Signed)
[facility]    Date of Admission:  07/19/2018   Total days of antibiotics 4        Day 4 ctx   ID: Dwayne Sandoval is a 23 y.o. male with  Disseminated GC with polyarticular arthritis  Principal Problem:   Polyarthritis of multiple sites Active Problems:   DGI (disseminated gonococcal infection) (HCC)    Subjective: Afebrile, less pain with wrist but still some pain associated with left ankle/foot  Medications:  . emtricitabine-tenofovir AF  1 tablet Oral Daily  . enoxaparin (LOVENOX) injection  40 mg Subcutaneous QHS  . ketorolac  15 mg Intravenous Q6H    Objective: Vital signs in last 24 hours: Temp:  [98.3 F (36.8 C)-98.7 F (37.1 C)] 98.3 F (36.8 C) (01/09 0540) Pulse Rate:  [61-64] 64 (01/09 0540) Resp:  [16-17] 17 (01/09 0540) BP: (112-126)/(58-78) 118/77 (01/09 0540) SpO2:  [91 %-100 %] 100 % (01/09 0540) Physical Exam  Constitutional: He is oriented to person, place, and time. He appears well-developed and well-nourished. No distress.  HENT:  Mouth/Throat: Oropharynx is clear and moist. No oropharyngeal exudate.  Cardiovascular: Normal rate, regular rhythm and normal heart sounds. Exam reveals no gallop and no friction rub.  No murmur heard.  Pulmonary/Chest: Effort normal and breath sounds normal. No respiratory distress. He has no wheezes.  Abdominal: Soft. Bowel sounds are normal. He exhibits no distension. There is no tenderness.  Lymphadenopathy:  He has no cervical adenopathy.  Neurological: He is alert and oriented to person, place, and time.  Skin: Skin is warm and dry. No rash noted. No erythema.  Psychiatric: He has a normal mood and affect. His behavior is normal.     Lab Results Recent Labs    07/23/18 0823 07/24/18 0819  WBC 7.5 5.7  HGB 13.0 12.6*  HCT 39.6 38.9*  NA 138 137  K 4.8 4.9  CL 104 106  CO2 24 25  BUN 14 18  CREATININE 0.77 0.81    Microbiology: reviewed Studies/Results: Mr Hand Right W  Wo Contrast  Result Date: 07/22/2018 CLINICAL DATA:  Hand swelling with infection suspected. EXAM: MRI OF THE RIGHT HAND WITHOUT AND WITH CONTRAST TECHNIQUE: Multiplanar, multisequence MR imaging of the right hand was performed before and after the administration of intravenous contrast. Imaging includes the distal carpal row to the PIP joint level of the second through fifth digits and the entirety of the thumb. CONTRAST:  7 mL Gadavist COMPARISON:  Radiographs from 07/19/2018 FINDINGS: Bones/Joint/Cartilage There is an enhancing dorsal fluid collection measuring approximately 2.4 x 1.1 x 0.7 cm in transverse by AP by craniocaudad dimension projecting over the radial aspect of the distal carpal row and scaphoid. This is interposed between the carpal bones and extensor tendons and raise concern for infected joint fluid, synovitis or abscess, series 7/5. Scattered faint marrow signal abnormalities are noted of the included carpal bones without frank bone destruction. No fractures identified. No suspicious osseous lesions. Ligaments Negative Muscles and Tendons No intramuscular edema, atrophy or evidence of pyomyositis. The flexor and extensor tendons crossing the wrist and hand are intact without tenosynovitis. Soft tissues No significant soft tissue swelling. IMPRESSION: Enhancing 2.4 x 1.1 x 0.7 cm fluid collection over the dorsum of the wrist as above described, more so along the radial aspect. Differential possibilities may include infected joint fluid, abscess, synovitis or combination thereof. Sampling of the fluid is recommended. Scattered faint nonspecific carpal bone marrow edema may be reactive in  etiology secondary to inflammatory arthropathy though a septic arthritis is not excluded. Electronically Signed   By: Tollie Eth M.D.   On: 07/22/2018 22:44     Assessment/Plan: Disseminated GC = recommend midline to finish out a total of 14d of ceftriaxone. Await to see MRI of left ankle/foot to see if  need washout  hiv prevention = will start descovy. To take daily.   [facility] Cell: [phone] Pager: [phone]  07/24/2018, 11:24 AM

## 2018-07-25 LAB — CULTURE, BLOOD (ROUTINE X 2)
Culture: NO GROWTH
Culture: NO GROWTH
Special Requests: ADEQUATE
Special Requests: ADEQUATE

## 2018-07-25 NOTE — Progress Notes (Addendum)
[facility]    Date of Admission:  07/19/2018   Total days of antibiotics 5          ID: Dwayne Sandoval is a 23 y.o. male with  Principal Problem:   Polyarthritis of multiple sites Active Problems:   DGI (disseminated gonococcal infection) (HCC)    Subjective: Still complaining of right wrist and left ankle pain/swelling. Afebrile. Poor sleep, now sleeping in the daytime  Medications:  . emtricitabine-tenofovir AF  1 tablet Oral Daily  . enoxaparin (LOVENOX) injection  40 mg Subcutaneous QHS    Objective: Vital signs in last 24 hours: Temp:  [98.2 F (36.8 C)-98.7 F (37.1 C)] 98.5 F (36.9 C) (01/10 1330) Pulse Rate:  [61-64] 61 (01/10 1330) Resp:  [16-17] 16 (01/10 1330) BP: (99-120)/(52-67) 120/67 (01/10 1330) SpO2:  [100 %] 100 % (01/10 1330) Physical Exam  Constitutional: He is oriented to person, place, and time. He appears well-developed and well-nourished. No distress.  HENT:  Mouth/Throat: Oropharynx is clear and moist. No oropharyngeal exudate.  Cardiovascular: Normal rate, regular rhythm and normal heart sounds. Exam reveals no gallop and no friction rub.  No murmur heard.  Pulmonary/Chest: Effort normal and breath sounds normal. No respiratory distress. He has no wheezes.  Neurological: He is alert and oriented to person, place, and time.  Skin: Skin is warm and dry. No rash noted. No erythema ZOX:WRUEExt:warm left ankle.  Psychiatric: He has a normal mood and affect. His behavior is normal.     Lab Results Recent Labs    07/23/18 0823 07/24/18 0819  WBC 7.5 5.7  HGB 13.0 12.6*  HCT 39.6 38.9*  NA 138 137  K 4.8 4.9  CL 104 106  CO2 24 25  BUN 14 18  CREATININE 0.77 0.81   Microbiology: reviewed Studies/Results: Mr Foot Left W Wo Contrast  Result Date: 07/24/2018 CLINICAL DATA:  Left forefoot pain with difficulty bearing weight. Evaluate for possible sepsis. EXAM: MRI OF THE LEFT FOREFOOT WITHOUT AND WITH CONTRAST  TECHNIQUE: Multiplanar, multisequence MR imaging of the left forefoot was performed both before and after administration of intravenous contrast. CONTRAST:  7 mL Gadavist IV COMPARISON:  Radiographs of the left foot dated 07/19/2018 FINDINGS: Bones/Joint/Cartilage Abnormal marrow edema of the mid foot involving the tarsal navicular, medial aspect of the cuboid, the cuneiforms and base second, third fourth metatarsals. No joint effusion or bone destruction. No acute fracture or erosive change. No marrow signal abnormality of the toes nor distal metatarsals. No joint dislocation is identified. Ligaments The Lisfranc ligament appears intact. Muscles and Tendons No evidence of muscle atrophy, mass or fluid collections. No evidence of pyomyositis. The tendons crossing the forefoot appear to be intact. Soft tissues Soft tissue swelling over the dorsum of the foot is identified. IMPRESSION: 1. Non specific bone marrow edema of the midfoot as above described without frank bone destruction nor apparent fracture. Findings may be secondary to an inflammatory arthropathy, calcium pyrophosphate deposition disease, or potentially early Charcot arthropathy in diabetic patient some considerations including overuse/stress response though the distribution would be somewhat unusual. No apparent joint effusion. No definite evidence of a septic arthropathy. Clinical correlation suggested. 2. Mild soft tissue swelling over the dorsum foot without drainable fluid collection or abscess. No evidence of pyomyositis. Electronically Signed   By: Tollie Ethavid  Kwon M.D.   On: 07/24/2018 19:44   Mr Ankle Left W Wo Contrast  Result Date: 07/24/2018 CLINICAL DATA:  Pain and difficulty weight-bearing. EXAM:  MRI OF THE LEFT ANKLE WITHOUT AND WITH CONTRAST TECHNIQUE: Multiplanar, multisequence MR imaging of the ankle was performed before and after the administration of intravenous contrast. CONTRAST:  7 mL Gadavist IV COMPARISON:  Foot radiographs  07/19/2017 FINDINGS: TENDONS Peroneal: Intact and of normal signal intensity morphology. Posteromedial: Intact Anterior: Intact Achilles: Intact Plantar Fascia: Intact LIGAMENTS Lateral: Intact. Medial: Intact. CARTILAGE Ankle Joint: No joint effusion. Subtalar Joints/Sinus Tarsi: No joint effusion or chondral defect. Bones: Marrow edema of the midfoot as described on the forefoot MRI. These involve the cuboid, cuneiform, navicular and base second through fourth metatarsals. Other: Mild dorsal soft tissue swelling of the included midfoot. IMPRESSION: IMPRESSION 1. Bone marrow edema of the midfoot. Differential considerations would an inflammatory arthropathy, early Charcot arthropathy, overuse, less likely septic arthropathy among some considerations. No definite fracture or suspicious osseous lesions. 2. Mild dorsal soft tissue swelling of the included midfoot without drainable fluid collection or abscess. 3. Intact tendons and ligaments crossing the ankle joint. Electronically Signed   By: Tollie Eth M.D.   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 No fluid collections noted to need surgical intervention  High risk sex = will continue on descovy for PrEP  Will see back on monday  [facility] Cell: [phone] Pager: [phone]  07/25/2018, 4:20 PM

## 2018-07-25 NOTE — Progress Notes (Signed)
PHARMACY CONSULT NOTE FOR:  OUTPATIENT  PARENTERAL ANTIBIOTIC THERAPY (OPAT)  Indication: Disseminated GC with polyarticular arthritis Regimen: Ceftriaxone 2 g IV q24h End date: 08/03/18 for total of 14 days of IV antibiotics per ID  IV antibiotic discharge orders are pended. To discharging provider:  please sign these orders via discharge navigator,  Select New Orders & click on the button choice - Manage This Unsigned Work.     Thank you for allowing pharmacy to be a part of this patient's care.  Jodelle Red Wonda Goodgame 07/25/2018, 1:09 PM

## 2018-07-25 NOTE — Progress Notes (Signed)
PROGRESS NOTE  Dwayne Sandoval DGL:[id number] DOB: 04/22/1996 DOA: 07/19/2018 PCP: [facility], [facility]  HPI/Recap of past 24 hours:   Fever resolved  Right dorsal hand/wrist less pain, less swelling has almost resolved, able to move right hand/wrist more left foot pain is improving, able to stand up on it but still not able to walk on it, dorsal forefoot edema has almost resolved   Assessment/Plan: Principal Problem:   Polyarthritis of multiple sites Active Problems:   DGI (disseminated gonococcal infection) (HCC)   Disseminated gc with polyarticular arthritis Blood culture no growth, HIV/RPR negative  On iv rocephin and HIV prophylaxis ID input appreciated  righ wrist pain, left foot pain -mri wrist "Enhancing 2.4 x 1.1 x 0.7 cm fluid collection over the dorsum of the wrist as above described, more so along the radial aspect. Differential possibilities may include infected joint fluid, abscess, synovitis or combination thereof. Sampling of the fluid is recommended. Scattered faint nonspecific carpal bone marrow edema may be reactive in etiology secondary to inflammatory arthropathy though a septic arthritis is not excluded. - will contact hand surgery   left foot/ankle mri:  . Bone marrow edema of the midfoot. Differential considerations would an inflammatory arthropathy, early Charcot arthropathy, overuse, less likely septic arthropathy among some considerations. No definite fracture or suspicious osseous lesions. 2. Mild dorsal soft tissue swelling of the included midfoot without drainable fluid collection or abscess. 3. Intact tendons and ligaments crossing the ankle joint.   Code Status: full  Family Communication: patient   Disposition Plan: not ready to discharge   Consultants:  ID  Hand surgery  Procedures:  none  Antibiotics:  rocephin   Objective: BP 120/67 (BP Location: Right Arm)   Pulse 61   Temp 98.5 F (36.9 C) (Oral)    Resp 16   Ht 5\' 10"  (1.778 m)   Wt 68 kg   SpO2 100%   BMI 21.51 kg/m   Intake/Output Summary (Last 24 hours) at 07/25/2018 1709 Last data filed at 07/25/2018 1030 Gross per 24 hour  Intake 614.99 ml  Output 1450 ml  Net -835.01 ml   Filed Weights   07/19/18 1348 07/19/18 2126  Weight: 68 kg 68 kg    Exam: Patient is examined daily including today on 07/25/2018, exams remain the same as of yesterday except that has changed    General:  NAD  Cardiovascular: RRR  Respiratory: CTABL  Abdomen: Soft/ND/NT, positive BS  Musculoskeletal: right wrist, dorsal hand less tender, left dorsal forefoot  Edema has almost resolved,less tender  Neuro: alert, oriented   Data Reviewed: Basic Metabolic Panel: Recent Labs  Lab 07/20/18 0350 07/21/18 0359 07/22/18 0343 07/23/18 0823 07/24/18 0819  NA 138 138 136 138 137  K 3.9 4.2 5.0 4.8 4.9  CL 105 105 103 104 106  CO2 24 24 23 24 25   GLUCOSE 107* 103* 86 101* 102*  BUN 15 12 12 14 18   CREATININE 0.89 0.75 0.81 0.77 0.81  CALCIUM 8.5* 8.5* 9.0 9.3 9.1  MG 2.3 1.9 2.2 2.3 2.3   Liver Function Tests: Recent Labs  Lab 07/20/18 1448  AST 37  ALT 31  ALKPHOS 92  BILITOT 0.9  PROT 7.1  ALBUMIN 3.1*   No results for input(s): LIPASE, AMYLASE in the last 168 hours. No results for input(s): AMMONIA in the last 168 hours. CBC: Recent Labs  Lab 07/20/18 0350 07/21/18 0359 07/22/18 0343 07/23/18 0823 07/24/18 0819  WBC 10.4 10.1 11.3* 7.5 5.7  NEUTROABS 7.4 7.8* 8.0* 5.3 4.0  HGB 12.3* 11.8* 12.3* 13.0 12.6*  HCT 37.5* 36.4* 38.4* 39.6 38.9*  MCV 90.1 88.8 92.3 90.2 89.2  PLT 455* 554* 555* 648* 689*   Cardiac Enzymes:   No results for input(s): CKTOTAL, CKMB, CKMBINDEX, TROPONINI in the last 168 hours. BNP (last 3 results) No results for input(s): BNP in the last 8760 hours.  ProBNP (last 3 results) No results for input(s): PROBNP in the last 8760 hours.  CBG: No results for input(s): GLUCAP in the last 168  hours.  Recent Results (from the past 240 hour(s))  Culture, blood (routine x 2)     Status: None   Collection Time: 07/20/18  2:48 PM  Result Value Ref Range Status   Specimen Description   Final    BLOOD RIGHT ANTECUBITAL Performed at [facility], [address] [place], [place] [address]    Special Requests   Final    AB Blood Culture adequate volume Performed at [facility], [address] [place], [place] [address]    Culture   Final    NO GROWTH 5 DAYS Performed at [facility], [address], [place], [place] [address]    Report Status 07/25/2018 FINAL  Final  Culture, blood (routine x 2)     Status: None   Collection Time: 07/20/18  2:48 PM  Result Value Ref Range Status   Specimen Description   Final    BLOOD LEFT ANTECUBITAL Performed at [facility], [address] [place], [place] [address]    Special Requests   Final    BOTTLES DRAWN AEROBIC ONLY Blood Culture adequate volume Performed at [facility], [address] [place], [place] [address]    Culture   Final    NO GROWTH 5 DAYS Performed at [facility], [address] [place], [place] [address]    Report Status 07/25/2018 FINAL  Final     Studies: Mr Foot Left W Wo Contrast  Result Date: 07/24/2018 CLINICAL DATA:  Left forefoot pain with difficulty bearing weight. Evaluate for possible sepsis. EXAM: MRI OF THE LEFT FOREFOOT WITHOUT AND WITH CONTRAST TECHNIQUE: Multiplanar, multisequence MR imaging of the left forefoot was performed both before and after administration of intravenous contrast. CONTRAST:  7 mL Gadavist IV COMPARISON:  Radiographs of the left foot dated 07/19/2018 FINDINGS: Bones/Joint/Cartilage Abnormal marrow edema of the mid foot involving the tarsal navicular, medial aspect of the cuboid, the cuneiforms and base second, third fourth metatarsals. No joint effusion or bone destruction. No acute  fracture or erosive change. No marrow signal abnormality of the toes nor distal metatarsals. No joint dislocation is identified. Ligaments The Lisfranc ligament appears intact. Muscles and Tendons No evidence of muscle atrophy, mass or fluid collections. No evidence of pyomyositis. The tendons crossing the forefoot appear to be intact. Soft tissues Soft tissue swelling over the dorsum of the foot is identified. IMPRESSION: 1. Non specific bone marrow edema of the midfoot as above described without frank bone destruction nor apparent fracture. Findings may be secondary to an inflammatory arthropathy, calcium pyrophosphate deposition disease, or potentially early Charcot arthropathy in diabetic patient some considerations including overuse/stress response though the distribution would be somewhat unusual. No apparent joint effusion. No definite evidence of a septic arthropathy. Clinical correlation suggested. 2. Mild soft tissue swelling over the dorsum foot without drainable fluid collection or abscess. No evidence of pyomyositis. Electronically Signed  By: Tollie Ethavid  Kwon M.D.   On: 07/24/2018 19:44   Mr Ankle Left W Wo Contrast  Result Date: 07/24/2018 CLINICAL DATA:  Pain and difficulty weight-bearing. EXAM: MRI OF THE LEFT ANKLE WITHOUT AND WITH CONTRAST TECHNIQUE: Multiplanar, multisequence MR imaging of the ankle was performed before and after the administration of intravenous contrast. CONTRAST:  7 mL Gadavist IV COMPARISON:  Foot radiographs 07/19/2017 FINDINGS: TENDONS Peroneal: Intact and of normal signal intensity morphology. Posteromedial: Intact Anterior: Intact Achilles: Intact Plantar Fascia: Intact LIGAMENTS Lateral: Intact. Medial: Intact. CARTILAGE Ankle Joint: No joint effusion. Subtalar Joints/Sinus Tarsi: No joint effusion or chondral defect. Bones: Marrow edema of the midfoot as described on the forefoot MRI. These involve the cuboid, cuneiform, navicular and base second through fourth  metatarsals. Other: Mild dorsal soft tissue swelling of the included midfoot. IMPRESSION: IMPRESSION 1. Bone marrow edema of the midfoot. Differential considerations would an inflammatory arthropathy, early Charcot arthropathy, overuse, less likely septic arthropathy among some considerations. No definite fracture or suspicious osseous lesions. 2. Mild dorsal soft tissue swelling of the included midfoot without drainable fluid collection or abscess. 3. Intact tendons and ligaments crossing the ankle joint. Electronically Signed   By: Tollie Ethavid  Kwon M.D.   On: 07/24/2018 19:48    Scheduled Meds: . emtricitabine-tenofovir AF  1 tablet Oral Daily  . enoxaparin (LOVENOX) injection  40 mg Subcutaneous QHS    Continuous Infusions: . sodium chloride 10 mL/hr (07/25/18 1143)  . cefTRIAXone (ROCEPHIN)  IV 2 g (07/25/18 1142)     Time spent: 25mins, case discussed with ID I have personally reviewed and interpreted on  07/25/2018 daily labs, imagings as discussed above under date review session and assessment and plans.  I reviewed all nursing notes, pharmacy notes, consultant notes,  vitals, pertinent old records  I have discussed plan of care as described above with RN , patient on 07/25/2018   Albertine GratesFang Tameca Jerez MD, PhD  [facility] Pager [phone]. If 7PM-7AM, please contact night-coverage at [email or web address], password [email or web address] 07/25/2018, 5:09 PM  LOS: 4 days

## 2018-07-25 NOTE — Consult Note (Signed)
Dwayne Sandoval is an 23 y.o. male.   Chief Complaint: Right wrist pain HPI: 23 year old right-hand-dominant male has been admitted over the past week for disseminated gonococcal infection.  He is noted pain at the right wrist starting approximately 1 week ago.  He had associated swelling.  Has had some fevers but no chills or night sweats.  Has been on IV antibiotics.  He has noted improvement in the pain in the wrist.  He no longer has any pain at rest.  He notes pain only with full extension of the wrist.  Case discussed with Albertine Grates, MD and her note from 07/25/2018 reviewed. Xrays viewed and interpreted by me: AP lateral oblique views of the right wrist from July 19, 2018 show no fracture dislocation or radiopaque foreign body.  MRI of the right wrist from July 22, 2018 shows a joint effusion at the dorsal radial aspect of the wrist. Labs reviewed: WBC 5.7  Allergies: No Known Allergies  Past Medical History:  Diagnosis Date  . 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 No pertinent surgical history.  Family History: Family History  Problem Relation Age of Onset  . Asthma Mother     Social History:   reports that he is a non-smoker but has been exposed to tobacco smoke. He has never used smokeless tobacco. He reports current alcohol use. He reports that he does not use drugs.  Medications: Medications Prior to Admission  Medication Sig Dispense Refill  . acetaminophen (TYLENOL) 500 MG tablet Take 2 tablets (1,000 mg total) by mouth every 6 (six) hours as needed for mild pain, moderate pain, fever or headache. 30 tablet 0  . albuterol (PROVENTIL HFA;VENTOLIN HFA) 108 (90 BASE) MCG/ACT inhaler Inhale 2 puffs into the lungs every 4 (four) hours as needed for wheezing. (Patient not taking: Reported on 07/09/2018) 1 Inhaler 0  . ondansetron (ZOFRAN ODT) 4 MG disintegrating tablet Take 1 tablet (4 mg total) by mouth every 8 (eight) hours as needed for nausea or vomiting. (Patient  not taking: Reported on 07/19/2018) 12 tablet 0  . oseltamivir (TAMIFLU) 75 MG capsule Take 1 capsule (75 mg total) by mouth every 12 (twelve) hours. (Patient not taking: Reported on 07/19/2018) 10 capsule 0    Results for orders placed or performed during the hospital encounter of 07/19/18 (from the past 48 hour(s))  Basic metabolic panel     Status: Abnormal   Collection Time: 07/24/18  8:19 AM  Result Value Ref Range   Sodium 137 135 - 145 mmol/L   Potassium 4.9 3.5 - 5.1 mmol/L   Chloride 106 98 - 111 mmol/L   CO2 25 22 - 32 mmol/L   Glucose, Bld 102 (H) 70 - 99 mg/dL   BUN 18 6 - 20 mg/dL   Creatinine, Ser 4.43 0.61 - 1.24 mg/dL   Calcium 9.1 8.9 - 15.4 mg/dL   GFR calc non Af Amer >60 >60 mL/min   GFR calc Af Amer >60 >60 mL/min   Anion gap 6 5 - 15    Comment: Performed at [facility], [address] [place], [place] [address]  CBC with Differential/Platelet     Status: Abnormal   Collection Time: 07/24/18  8:19 AM  Result Value Ref Range   WBC 5.7 4.0 - 10.5 K/uL   RBC 4.36 4.22 - 5.81 MIL/uL   Hemoglobin 12.6 (L) 13.0 - 17.0 g/dL   HCT 61.9 (L) 50.9 - 32.6 %   MCV  89.2 80.0 - 100.0 fL   MCH 28.9 26.0 - 34.0 pg   MCHC 32.4 30.0 - 36.0 g/dL   RDW 07.6 22.6 - 33.3 %   Platelets 689 (H) 150 - 400 K/uL   nRBC 0.0 0.0 - 0.2 %   Neutrophils Relative % 71 %   Neutro Abs 4.0 1.7 - 7.7 K/uL   Lymphocytes Relative 22 %   Lymphs Abs 1.3 0.7 - 4.0 K/uL   Monocytes Relative 6 %   Monocytes Absolute 0.4 0.1 - 1.0 K/uL   Eosinophils Relative 1 %   Eosinophils Absolute 0.1 0.0 - 0.5 K/uL   Basophils Relative 0 %   Basophils Absolute 0.0 0.0 - 0.1 K/uL   Immature Granulocytes 0 %   Abs Immature Granulocytes 0.01 0.00 - 0.07 K/uL    Comment: Performed at [facility], [address], [place], [place] [address]  Magnesium     Status: None   Collection Time: 07/24/18  8:19 AM  Result Value Ref Range   Magnesium 2.3 1.7 - 2.4 mg/dL    Comment:  Performed at [facility], [address], [place], [place] [address]    Mr Foot Left W Wo Contrast  Result Date: 07/24/2018 CLINICAL DATA:  Left forefoot pain with difficulty bearing weight. Evaluate for possible sepsis. EXAM: MRI OF THE LEFT FOREFOOT WITHOUT AND WITH CONTRAST TECHNIQUE: Multiplanar, multisequence MR imaging of the left forefoot was performed both before and after administration of intravenous contrast. CONTRAST:  7 mL Gadavist IV COMPARISON:  Radiographs of the left foot dated 07/19/2018 FINDINGS: Bones/Joint/Cartilage Abnormal marrow edema of the mid foot involving the tarsal navicular, medial aspect of the cuboid, the cuneiforms and base second, third fourth metatarsals. No joint effusion or bone destruction. No acute fracture or erosive change. No marrow signal abnormality of the toes nor distal metatarsals. No joint dislocation is identified. Ligaments The Lisfranc ligament appears intact. Muscles and Tendons No evidence of muscle atrophy, mass or fluid collections. No evidence of pyomyositis. The tendons crossing the forefoot appear to be intact. Soft tissues Soft tissue swelling over the dorsum of the foot is identified. IMPRESSION: 1. Non specific bone marrow edema of the midfoot as above described without frank bone destruction nor apparent fracture. Findings may be secondary to an inflammatory arthropathy, calcium pyrophosphate deposition disease, or potentially early Charcot arthropathy in diabetic patient some considerations including overuse/stress response though the distribution would be somewhat unusual. No apparent joint effusion. No definite evidence of a septic arthropathy. Clinical correlation suggested. 2. Mild soft tissue swelling over the dorsum foot without drainable fluid collection or abscess. No evidence of pyomyositis. Electronically Signed   By: Tollie Eth M.D.   On: 07/24/2018 19:44   Mr Ankle Left W Wo Contrast  Result Date:  07/24/2018 CLINICAL DATA:  Pain and difficulty weight-bearing. EXAM: MRI OF THE LEFT ANKLE WITHOUT AND WITH CONTRAST TECHNIQUE: Multiplanar, multisequence MR imaging of the ankle was performed before and after the administration of intravenous contrast. CONTRAST:  7 mL Gadavist IV COMPARISON:  Foot radiographs 07/19/2017 FINDINGS: TENDONS Peroneal: Intact and of normal signal intensity morphology. Posteromedial: Intact Anterior: Intact Achilles: Intact Plantar Fascia: Intact LIGAMENTS Lateral: Intact. Medial: Intact. CARTILAGE Ankle Joint: No joint effusion. Subtalar Joints/Sinus Tarsi: No joint effusion or chondral defect. Bones: Marrow edema of the midfoot as described on the forefoot MRI. These involve the cuboid, cuneiform, navicular and base second through fourth metatarsals. Other: Mild dorsal soft tissue swelling of the included  midfoot. IMPRESSION: IMPRESSION 1. Bone marrow edema of the midfoot. Differential considerations would an inflammatory arthropathy, early Charcot arthropathy, overuse, less likely septic arthropathy among some considerations. No definite fracture or suspicious osseous lesions. 2. Mild dorsal soft tissue swelling of the included midfoot without drainable fluid collection or abscess. 3. Intact tendons and ligaments crossing the ankle joint. Electronically Signed   By: Tollie Ethavid  Kwon M.D.   On: 07/24/2018 19:48     A comprehensive review of systems was negative except for fevers. Review of Systems: No chills, night sweats, chest pain, shortness of breath, nausea, vomiting, diarrhea, constipation, easy bleeding or bruising, headaches, dizziness, vision changes, fainting.   Blood pressure 101/78, pulse 93, temperature 98.3 F (36.8 C), temperature source Oral, resp. rate 17, height 5\' 10"  (1.778 m), weight 68 kg, SpO2 100 %.  General appearance: alert, cooperative and appears stated age Head: Normocephalic, without obvious abnormality, atraumatic Neck: supple, symmetrical, trachea  midline Extremities: Intact sensation and capillary refill all digits.  +epl/fpl/io.  No wounds.  In the right wrist he is not swollen.  There is no erythema.  No proximal streaking.  He is tender at the dorsal radial aspect of the wrist.  He is not tender at the ulnar side of the wrist or volarly.  He has excellent range of motion of the wrist without pain except when he comes into full extension and he states it is somewhat uncomfortable.  He can make a complete fist without pain. Pulses: 2+ and symmetric Skin: Skin color, texture, turgor normal. No rashes or lesions Neurologic: Grossly normal Incision/Wound: none  Assessment/Plan Possible right wrist infection which is resolving.  Pain is almost fully resolved.  Normal white count.  At this time I do not see a need for an operative incision and drainage.  Would recommend continued IV antibiotics as he has shown marked improvement per nursing over the past few days.  He currently does not have any pain with the wrist except for full extension.  He is no longer swollen.  Follow-up as an outpatient.  If there is any recurrence of swelling in the wrist or need for fluid, consider ultrasound guided aspiration with radiology.  Betha LoaKevin Arti Trang 07/25/2018, 9:10 PM

## 2018-07-25 NOTE — Plan of Care (Signed)
Plan of care reviewed and discussed with the patient. 

## 2018-07-26 LAB — CREATININE, SERUM
Creatinine, Ser: 0.85 mg/dL (ref 0.61–1.24)
GFR calc Af Amer: >60 mL/min (ref >60)

## 2018-07-26 NOTE — Progress Notes (Signed)
PROGRESS NOTE  Dwayne Sandoval AYO:[id number] DOB: 1995-10-13 DOA: 07/19/2018 PCP: [facility], [facility]  HPI/Recap of past 24 hours:   Fever resolved  Right dorsal hand/wrist less pain, less swelling has almost resolved, able to move right hand/wrist more  Report left midfoot pain started from last night, able to stand up, but not able to put weight on when ambulate   Assessment/Plan: Principal Problem:   Polyarthritis of multiple sites Active Problems:   DGI (disseminated gonococcal infection) (HCC)   Disseminated gc with polyarticular arthritis Blood culture no growth, HIV/RPR negative  On iv rocephin and HIV prophylaxis ID input appreciated  righ wrist pain, left foot pain -mri wrist "Enhancing 2.4 x 1.1 x 0.7 cm fluid collection over the dorsum of the wrist as above described, more so along the radial aspect. Differential possibilities may include infected joint fluid, abscess, synovitis or combination thereof. Sampling of the fluid is recommended. Scattered faint nonspecific carpal bone marrow edema may be reactive in etiology secondary to inflammatory arthropathy though a septic arthritis is not excluded. - hand surgery consulted, no plan to operate. Recommend "Follow-up as an outpatient.  If there is any recurrence of swelling in the wrist or need for fluid, consider ultrasound guided aspiration with radiology"   left foot/ankle mri:  1. Bone marrow edema of the midfoot. Differential considerations would an inflammatory arthropathy, early Charcot arthropathy, overuse, less likely septic arthropathy among some considerations. No definite fracture or suspicious osseous lesions. 2. Mild dorsal soft tissue swelling of the included midfoot without drainable fluid collection or abscess. 3. Intact tendons and ligaments crossing the ankle joint.  PT eval  Code Status: full  Family Communication: patient   Disposition Plan: likely home on Monday     Consultants:  ID  Hand surgery  Procedures:  none  Antibiotics:  rocephin   Objective: BP 101/62 (BP Location: Right Arm)   Pulse 70   Temp 97.9 F (36.6 C) (Oral)   Resp 16   Ht 5\' 10"  (1.778 m)   Wt 68 kg   SpO2 98%   BMI 21.51 kg/m   Intake/Output Summary (Last 24 hours) at 07/26/2018 1226 Last data filed at 07/26/2018 2395 Gross per 24 hour  Intake 850 ml  Output 1150 ml  Net -300 ml   Filed Weights   07/19/18 1348 07/19/18 2126  Weight: 68 kg 68 kg    Exam: Patient is examined daily including today on 07/26/2018, exams remain the same as of yesterday except that has changed    General:  NAD  Cardiovascular: RRR  Respiratory: CTABL  Abdomen: Soft/ND/NT, positive BS  Musculoskeletal: right wrist, dorsal hand less tender, left dorsal forefoot  Edema has almost resolved,less tender, mid foot dorsal aspect tender, no significant edema Neuro: alert, oriented   Data Reviewed: Basic Metabolic Panel: Recent Labs  Lab 07/20/18 0350 07/21/18 0359 07/22/18 0343 07/23/18 0823 07/24/18 0819 07/26/18 0432  NA 138 138 136 138 137  --   K 3.9 4.2 5.0 4.8 4.9  --   CL 105 105 103 104 106  --   CO2 24 24 23 24 25   --   GLUCOSE 107* 103* 86 101* 102*  --   BUN 15 12 12 14 18   --   CREATININE 0.89 0.75 0.81 0.77 0.81 0.85  CALCIUM 8.5* 8.5* 9.0 9.3 9.1  --   MG 2.3 1.9 2.2 2.3 2.3  --    Liver Function Tests: Recent Labs  Lab 07/20/18 1448  AST 37  ALT 31  ALKPHOS 92  BILITOT 0.9  PROT 7.1  ALBUMIN 3.1*   No results for input(s): LIPASE, AMYLASE in the last 168 hours. No results for input(s): AMMONIA in the last 168 hours. CBC: Recent Labs  Lab 07/20/18 0350 07/21/18 0359 07/22/18 0343 07/23/18 0823 07/24/18 0819  WBC 10.4 10.1 11.3* 7.5 5.7  NEUTROABS 7.4 7.8* 8.0* 5.3 4.0  HGB 12.3* 11.8* 12.3* 13.0 12.6*  HCT 37.5* 36.4* 38.4* 39.6 38.9*  MCV 90.1 88.8 92.3 90.2 89.2  PLT 455* 554* 555* 648* 689*   Cardiac Enzymes:   No  results for input(s): CKTOTAL, CKMB, CKMBINDEX, TROPONINI in the last 168 hours. BNP (last 3 results) No results for input(s): BNP in the last 8760 hours.  ProBNP (last 3 results) No results for input(s): PROBNP in the last 8760 hours.  CBG: No results for input(s): GLUCAP in the last 168 hours.  Recent Results (from the past 240 hour(s))  Culture, blood (routine x 2)     Status: None   Collection Time: 07/20/18  2:48 PM  Result Value Ref Range Status   Specimen Description   Final    BLOOD RIGHT ANTECUBITAL Performed at [facility], [address], [place], [place] [address]    Special Requests   Final    AB Blood Culture adequate volume Performed at [facility], [address], [place], [place] [address]    Culture   Final    NO GROWTH 5 DAYS Performed at [facility], [address], [place], [place] [address]    Report Status 07/25/2018 FINAL  Final  Culture, blood (routine x 2)     Status: None   Collection Time: 07/20/18  2:48 PM  Result Value Ref Range Status   Specimen Description   Final    BLOOD LEFT ANTECUBITAL Performed at [facility], [address] [place], [place] [address]    Special Requests   Final    BOTTLES DRAWN AEROBIC ONLY Blood Culture adequate volume Performed at [facility], [address] [place], [place] [address]    Culture   Final    NO GROWTH 5 DAYS Performed at [facility], [address], [place], [place] [address]    Report Status 07/25/2018 FINAL  Final     Studies: No results found.  Scheduled Meds: . emtricitabine-tenofovir AF  1 tablet Oral Daily  . enoxaparin (LOVENOX) injection  40 mg Subcutaneous QHS    Continuous Infusions: . sodium chloride 10 mL/hr (07/25/18 1143)  . cefTRIAXone (ROCEPHIN)  IV 2 g (07/25/18 1142)     Time spent: ,  I have personally reviewed and interpreted on  07/26/2018 daily labs, imagings as  discussed above under date review session and assessment and plans.  I reviewed all nursing notes, pharmacy notes, consultant notes,  vitals, pertinent old records  I have discussed plan of care as described above with RN , patient on 07/26/2018   Albertine Grates MD, PhD  [facility] Pager [phone]. If 7PM-7AM, please contact night-coverage at [email or web address], password [facility] 07/26/2018, 12:26 PM  LOS: 5 days

## 2018-07-27 NOTE — Progress Notes (Signed)
PROGRESS NOTE  Dwayne Sandoval UJW:[id number] DOB: 29-May-1996 DOA: 07/19/2018 PCP: [facility]  HPI/Recap of past 24 hours:   Fever resolved  Right dorsal hand/wrist pain and swelling resolved  left forefoot pain /swelling resolved, left midfoot pain is improving, able to ambulate with PT today    Assessment/Plan: Principal Problem:   Polyarthritis of multiple sites Active Problems:   DGI (disseminated gonococcal infection) (HCC)   Disseminated gc with polyarticular arthritis Blood culture no growth, HIV/RPR negative  On iv rocephin and HIV prophylaxis ID input appreciated  righ wrist pain, left foot pain -mri wrist "Enhancing 2.4 x 1.1 x 0.7 cm fluid collection over the dorsum of the wrist as above described, more so along the radial aspect. Differential possibilities may include infected joint fluid, abscess, synovitis or combination thereof. Sampling of the fluid is recommended. Scattered faint nonspecific carpal bone marrow edema may be reactive in etiology secondary to inflammatory arthropathy though a septic arthritis is not excluded. - hand surgery consulted, no plan to operate. Recommend "Follow-up as an outpatient.  If there is any recurrence of swelling in the wrist or need for fluid, consider ultrasound guided aspiration with radiology"   left foot/ankle mri:  1. Bone marrow edema of the midfoot. Differential considerations would an inflammatory arthropathy, early Charcot arthropathy, overuse, less likely septic arthropathy among some considerations. No definite fracture or suspicious osseous lesions. 2. Mild dorsal soft tissue swelling of the included midfoot without drainable fluid collection or abscess. 3. Intact tendons and ligaments crossing the ankle joint.  PT eval  Code Status: full  Family Communication: patient   Disposition Plan: likely home on Monday    Consultants:  ID  Hand  surgery  Procedures:  none  Antibiotics:  rocephin   Objective: BP 111/63 (BP Location: Left Arm)   Pulse 69   Temp 98.4 F (36.9 C)   Resp 16   Ht 5\' 10"  (1.778 m)   Wt 68 kg   SpO2 98%   BMI 21.51 kg/m   Intake/Output Summary (Last 24 hours) at 07/27/2018 1640 Last data filed at 07/27/2018 1444 Gross per 24 hour  Intake 1786 ml  Output 1525 ml  Net 261 ml   Filed Weights   07/19/18 1348 07/19/18 2126  Weight: 68 kg 68 kg    Exam: Patient is examined daily including today on 07/27/2018, exams remain the same as of yesterday except that has changed    General:  NAD  Cardiovascular: RRR  Respiratory: CTABL  Abdomen: Soft/ND/NT, positive BS  Musculoskeletal: right wrist, dorsal hand nontender, no edema, left dorsal forefoot notender, no  Edema, left mid foot tender, no significant edema  Neuro: alert, oriented   Data Reviewed: Basic Metabolic Panel: Recent Labs  Lab 07/21/18 0359 07/22/18 0343 07/23/18 0823 07/24/18 0819 07/26/18 0432  NA 138 136 138 137  --   K 4.2 5.0 4.8 4.9  --   CL 105 103 104 106  --   CO2 24 23 24 25   --   GLUCOSE 103* 86 101* 102*  --   BUN 12 12 14 18   --   CREATININE 0.75 0.81 0.77 0.81 0.85  CALCIUM 8.5* 9.0 9.3 9.1  --   MG 1.9 2.2 2.3 2.3  --    Liver Function Tests: No results for input(s): AST, ALT, ALKPHOS, BILITOT, PROT, ALBUMIN in the last 168 hours. No results for input(s): LIPASE, AMYLASE in the last 168 hours. No results for input(s): AMMONIA in  the last 168 hours. CBC: Recent Labs  Lab 07/21/18 0359 07/22/18 0343 07/23/18 0823 07/24/18 0819  WBC 10.1 11.3* 7.5 5.7  NEUTROABS 7.8* 8.0* 5.3 4.0  HGB 11.8* 12.3* 13.0 12.6*  HCT 36.4* 38.4* 39.6 38.9*  MCV 88.8 92.3 90.2 89.2  PLT 554* 555* 648* 689*   Cardiac Enzymes:   No results for input(s): CKTOTAL, CKMB, CKMBINDEX, TROPONINI in the last 168 hours. BNP (last 3 results) No results for input(s): BNP in the last 8760 hours.  ProBNP (last 3  results) No results for input(s): PROBNP in the last 8760 hours.  CBG: No results for input(s): GLUCAP in the last 168 hours.  Recent Results (from the past 240 hour(s))  Culture, blood (routine x 2)     Status: None   Collection Time: 07/20/18  2:48 PM  Result Value Ref Range Status   Specimen Description   Final    BLOOD RIGHT ANTECUBITAL Performed at [facility], [address], [place], [place] [address]    Special Requests   Final    AB Blood Culture adequate volume Performed at [facility], [address], [place], [place] [address]    Culture   Final    NO GROWTH 5 DAYS Performed at [facility], [address], [place], [place] [address]    Report Status 07/25/2018 FINAL  Final  Culture, blood (routine x 2)     Status: None   Collection Time: 07/20/18  2:48 PM  Result Value Ref Range Status   Specimen Description   Final    BLOOD LEFT ANTECUBITAL Performed at [facility], [address], [place], [place] [address]    Special Requests   Final    BOTTLES DRAWN AEROBIC ONLY Blood Culture adequate volume Performed at [facility], [address] [place], [place] [address]    Culture   Final    NO GROWTH 5 DAYS Performed at [facility], [address] [place], [place] [address]    Report Status 07/25/2018 FINAL  Final     Studies: No results found.  Scheduled Meds: . emtricitabine-tenofovir AF  1 tablet Oral Daily  . enoxaparin (LOVENOX) injection  40 mg Subcutaneous QHS    Continuous Infusions: . sodium chloride 10 mL/hr (07/25/18 1143)  . cefTRIAXone (ROCEPHIN)  IV 2 g (07/27/18 1340)     Time spent: ,  I have personally reviewed and interpreted on  07/27/2018 daily labs, imagings as discussed above under date review session and assessment and plans.  I reviewed all nursing notes, pharmacy notes, consultant notes,  vitals, pertinent old records  I have  discussed plan of care as described above with RN , patient on 07/27/2018   Albertine Grates MD, PhD  [facility] Pager [phone]. If 7PM-7AM, please contact night-coverage at [email or web address], password [facility] 07/27/2018, 4:40 PM  LOS: 6 days

## 2018-07-27 NOTE — Evaluation (Signed)
Physical Therapy Evaluation Patient Details Name: Dwayne Sandoval MRN: [id number] DOB: 05-26-1996 Today's Date: 07/27/2018   History of Present Illness  23 yo male admitted with polyarthritis. Hx of DM  Clinical Impression  On eval, pt was Min guard assist for mobility. He walked ~60 feet with use of a RW. Pain was rated 7/10 with activity. Discussed d/c plan-pt will return home. He lives in an apt with 1 flight of stairs to entry. Will follow and progress activity as tolerated. Pt has a borrowed set of crutches that he was using PTA. With continued education and practice, he may be able to progress to using crutches for ambulation and stair negotiation.     Follow Up Recommendations No PT follow up    Equipment Recommendations  Rolling walker with 5" wheels???    Recommendations for Other Services       Precautions / Restrictions Precautions Precautions: Fall Restrictions Weight Bearing Restrictions: No      Mobility  Bed Mobility Overal bed mobility: Modified Independent                Transfers Overall transfer level: Needs assistance Equipment used: Rolling walker (2 wheeled) Transfers: Sit to/from Stand Sit to Stand: Supervision         General transfer comment: for safety, hand placement.   Ambulation/Gait Ambulation/Gait assistance: Min guard Gait Distance (Feet): 60 Feet Assistive device: Rolling walker (2 wheeled) Gait Pattern/deviations: Step-to pattern;Antalgic     General Gait Details: VCS safety, sequence, technique. Slow gait speed. No LOB with RW use.   Stairs            Wheelchair Mobility    Modified Rankin (Stroke Patients Only)       Balance Overall balance assessment: Mild deficits observed, not formally tested                                           Pertinent Vitals/Pain Pain Assessment: 0-10 Pain Score: 7  Pain Location: L foot Pain Descriptors / Indicators: Discomfort;Sore Pain Intervention(s):  Limited activity within patient's tolerance;Repositioned    Home Living Family/patient expects to be discharged to:: Private residence Living Arrangements: Other relatives   Type of Home: Apartment Home Access: Stairs to enter Entrance Stairs-Rails: Right Entrance Stairs-Number of Steps: 1 fllight Home Layout: One level Home Equipment: Crutches      Prior Function Level of Independence: Independent               Hand Dominance        Extremity/Trunk Assessment        Lower Extremity Assessment Lower Extremity Assessment: LLE deficits/detail LLE Deficits / Details: swelling dorsum L foot    Cervical / Trunk Assessment Cervical / Trunk Assessment: Normal  Communication   Communication: No difficulties  Cognition Arousal/Alertness: Awake/alert Behavior During Therapy: WFL for tasks assessed/performed Overall Cognitive Status: Within Functional Limits for tasks assessed                                        General Comments      Exercises     Assessment/Plan    PT Assessment Patient needs continued PT services  PT Problem List Decreased balance;Decreased mobility;Decreased activity tolerance;Pain;Decreased knowledge of use of DME       PT Treatment  Interventions DME instruction;Gait training;Functional mobility training;Therapeutic activities;Balance training;Patient/family education;Stair training;Therapeutic exercise    PT Goals (Current goals can be found in the Care Plan section)  Acute Rehab PT Goals Patient Stated Goal: less pain. regain PLOF.  PT Goal Formulation: With patient Time For Goal Achievement: 08/10/18 Potential to Achieve Goals: Good    Frequency Min 3X/week   Barriers to discharge        Co-evaluation               AM-PAC PT "6 Clicks" Mobility  Outcome Measure Help needed turning from your back to your side while in a flat bed without using bedrails?: None Help needed moving from lying on your back  to sitting on the side of a flat bed without using bedrails?: None Help needed moving to and from a bed to a chair (including a wheelchair)?: A Little Help needed standing up from a chair using your arms (e.g., wheelchair or bedside chair)?: A Little Help needed to walk in hospital room?: A Little Help needed climbing 3-5 steps with a railing? : A Little 6 Click Score: 20    End of Session Equipment Utilized During Treatment: Gait belt Activity Tolerance: Patient limited by pain Patient left: in bed;with call bell/phone within reach   PT Visit Diagnosis: Unsteadiness on feet (R26.81);Pain;Difficulty in walking, not elsewhere classified (R26.2) Pain - Right/Left: Left Pain - part of body: Ankle and joints of foot    Time: 1133-1145 PT Time Calculation (min) (ACUTE ONLY): 12 min   Charges:   PT Evaluation $PT Eval Moderate Complexity: 1 Mod            Rebeca Alert, PT Acute Rehabilitation Services Pager: [phone] Office: [phone]

## 2018-07-28 ENCOUNTER — Telehealth: Payer: Self-pay

## 2018-07-28 NOTE — Telephone Encounter (Signed)
Patient will need office visit to continue Rocephin 250 mgs  daily for 3 days.  He will see Judeth Cornfield to establish care and we will continue injections as ordered by Dr Drue Second.   Laurell Josephs , RN

## 2018-07-28 NOTE — Progress Notes (Signed)
At 1230, the pt was provided with d/c instructions. After discussing the pt's plan of care upon d/c, the pt reported no further questions or concerns. The pt has been informed that he can leave once his IV ABX is complete and his IV has been removed.

## 2018-07-28 NOTE — Discharge Summary (Signed)
Discharge Summary  Dwayne Sandoval ZOX:[id number] DOB: 1996/05/05  PCP: [facility] (Inactive)  Admit date: 07/19/2018 Discharge date: 07/28/2018  Time spent: , more than 50% time spent on coordination of care.  Recommendations for Outpatient Follow-up:  1. F/u with PCP within a week  for hospital discharge follow up, repeat cbc/bmp at follow up 2. F/u with infectious disease on 1/14 to received daily  IM rocephin 3. F/u with hand surgery as needed   Discharge Diagnoses:  Active Hospital Problems   Diagnosis Date Noted  . 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 DGI (disseminated gonococcal infection) [facility])     Resolved Hospital Problems  No resolved problems to display.    Discharge Condition: stable  Diet recommendation: regular diet  Filed Weights   07/19/18 1348 07/19/18 2126  Weight: 68 kg 68 kg    History of present illness: (per admitting MD Dr Willaim Bane) Chief Complaint: R hand and L foot swelling and pain  HPI: Dwayne Sandoval is a 23 y.o. 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 Patient had reported generalized myalgias, subjective fevers in late December (about 2-3 weeks ago) and was treated empirically with Tamiflu. No hand or foot swelling noted at that time. Denies clear URI sx. His myalgias and subjective fever resolved with completion of Tamiflu and then on the following week on New Year's, patient noted left foot mild swelling and pain with ambulation. He went to urgent care who recommended ice/wrapping/elevation. A few days later, his right hand also started swelling, with pain with range of motion and tenderness to palpation. On day of admission, patient noted that his left foot and right hand were still markedly swollen and painful with movement + tender to touch, which prompted  patient to seek hospital care.   Denies recent trauma to his hand or foot. Denies IV drug use. Reported unprotected anal-receptive sex (MSM) in December. States last HIV test was in July 2019 but has not been tested since then. He reports that aforementioned myalgias have resolved 2 weeks ago. No recent fevers or chills. No other sick contacts. No recent travel or bug bites. Of note, patient was recently incarcerated this summer and is currently on probation. No rashes.     Hospital Course:  Principal Problem:   Polyarthritis of multiple sites Active Problems:   DGI (disseminated gonococcal infection) (HCC)   Disseminated gc with polyarticular arthritis Blood culture no growth, HIV/RPR negative  On iv rocephin and HIV prophylaxis ID input appreciated, patient is to discharge home, f/u with infectious disease tomorrow to receive im rocephin to finish treatment course.   righ wrist pain, left foot pain -mri wrist "Enhancing 2.4 x 1.1 x 0.7 cm fluid collection over the dorsum of the wrist as above described, more so along the radial aspect. Differential possibilities may include infected joint fluid, abscess, synovitis or combination thereof. Sampling of the fluid is recommended. Scattered faint nonspecific carpal bone marrow edema may be reactive in etiology secondary to inflammatory arthropathy though a septic arthritis is not excluded. - hand surgery consulted, no plan to operate. Recommend "Follow-up as an outpatient. If there is any recurrence of swelling in the wrist or need for fluid, consider ultrasound guided aspiration with radiology"   left foot/ankle mri:  1. Bone marrow edema of the midfoot. Differential  considerations would an inflammatory arthropathy, early Charcot arthropathy, overuse, less likely septic arthropathy among some considerations. No definite fracture or suspicious osseous lesions. 2. Mild dorsal soft tissue swelling of the included midfoot without  drainable fluid collection or abscess. 3. Intact tendons and ligaments crossing the ankle joint.  Left foot pain has improved, He did well with PT eval  Code Status: full  Family Communication: patient   Disposition Plan: home with infectious disease clearance    Consultants:  ID  Hand surgery  Procedures:  none  Antibiotics:  rocephin   Discharge Exam: BP (!) 97/54 (BP Location: Right Arm)   Pulse 65   Temp 98.3 F (36.8 C) (Oral)   Resp 17   Ht 5\' 10"  (1.778 m)   Wt 68 kg   SpO2 100%   BMI 21.51 kg/m    General:  NAD  Cardiovascular: RRR  Respiratory: CTABL  Abdomen: Soft/ND/NT, positive BS  Musculoskeletal: right wrist, dorsal hand nontender, no edema, range of motion intact,  left dorsal forefoot notender, no  Edema, left mid foot tender, no significant edema  Neuro: alert, oriented     Discharge Instructions You were cared for by a hospitalist during your hospital stay. If you have any questions about your discharge medications or the care you received while you were in the hospital after you are discharged, you can call the unit and asked to speak with the hospitalist on call if the hospitalist that took care of you is not available. Once you are discharged, your primary care physician will handle any further medical issues. Please note that NO REFILLS for any discharge medications will be authorized once you are discharged, as it is imperative that you return to your primary care physician (or establish a relationship with a primary care physician if you do not have one) for your aftercare needs so that they can reassess your need for medications and monitor your lab values.  Discharge Instructions    Diet general   Complete by:  As directed    Increase activity slowly   Complete by:  As directed      Allergies as of 07/28/2018   No Known Allergies     Medication List    STOP taking these medications   albuterol 108 (90 Base)  MCG/ACT inhaler Commonly known as:  PROVENTIL HFA;VENTOLIN HFA   ondansetron 4 MG disintegrating tablet Commonly known as:  ZOFRAN ODT   oseltamivir 75 MG capsule Commonly known as:  TAMIFLU     TAKE these medications   acetaminophen 500 MG tablet Commonly known as:  TYLENOL Take 2 tablets (1,000 mg total) by mouth every 6 (six) hours as needed for mild pain, moderate pain, fever or headache.   emtricitabine-tenofovir AF 200-25 MG tablet Commonly known as:  DESCOVY Take 1 tablet by mouth daily.      No Known Allergies Follow-up Information    Betha Loa, MD Follow up.   Specialty:  Orthopedic Surgery Contact information: [address] [place] [address] [phone]        [facility] Follow up.        [facility]              Follow up on 07/29/2018.   Contact information: [address] [place] [address]           The results of significant diagnostics from this hospitalization (including imaging, microbiology, ancillary and laboratory) are  listed below for reference.    Significant Diagnostic Studies: Mr Hand Right W Wo Contrast  Result Date: 07/22/2018 CLINICAL DATA:  Hand swelling with infection suspected. EXAM: MRI OF THE RIGHT HAND WITHOUT AND WITH CONTRAST TECHNIQUE: Multiplanar, multisequence MR imaging of the right hand was performed before and after the administration of intravenous contrast. Imaging includes the distal carpal row to the PIP joint level of the second through fifth digits and the entirety of the thumb. CONTRAST:  7 mL Gadavist COMPARISON:  Radiographs from 07/19/2018 FINDINGS: Bones/Joint/Cartilage There is an enhancing dorsal fluid collection measuring approximately 2.4 x 1.1 x 0.7 cm in transverse by AP by craniocaudad dimension projecting over the radial aspect of the distal carpal row and scaphoid. This is interposed between the carpal bones and extensor tendons  and raise concern for infected joint fluid, synovitis or abscess, series 7/5. Scattered faint marrow signal abnormalities are noted of the included carpal bones without frank bone destruction. No fractures identified. No suspicious osseous lesions. Ligaments Negative Muscles and Tendons No intramuscular edema, atrophy or evidence of pyomyositis. The flexor and extensor tendons crossing the wrist and hand are intact without tenosynovitis. Soft tissues No significant soft tissue swelling. IMPRESSION: Enhancing 2.4 x 1.1 x 0.7 cm fluid collection over the dorsum of the wrist as above described, more so along the radial aspect. Differential possibilities may include infected joint fluid, abscess, synovitis or combination thereof. Sampling of the fluid is recommended. Scattered faint nonspecific carpal bone marrow edema may be reactive in etiology secondary to inflammatory arthropathy though a septic arthritis is not excluded. Electronically Signed   By: Tollie Ethavid  Kwon M.D.   On: 07/22/2018 22:44   Mr Foot Left W Wo Contrast  Result Date: 07/24/2018 CLINICAL DATA:  Left forefoot pain with difficulty bearing weight. Evaluate for possible sepsis. EXAM: MRI OF THE LEFT FOREFOOT WITHOUT AND WITH CONTRAST TECHNIQUE: Multiplanar, multisequence MR imaging of the left forefoot was performed both before and after administration of intravenous contrast. CONTRAST:  7 mL Gadavist IV COMPARISON:  Radiographs of the left foot dated 07/19/2018 FINDINGS: Bones/Joint/Cartilage Abnormal marrow edema of the mid foot involving the tarsal navicular, medial aspect of the cuboid, the cuneiforms and base second, third fourth metatarsals. No joint effusion or bone destruction. No acute fracture or erosive change. No marrow signal abnormality of the toes nor distal metatarsals. No joint dislocation is identified. Ligaments The Lisfranc ligament appears intact. Muscles and Tendons No evidence of muscle atrophy, mass or fluid collections. No  evidence of pyomyositis. The tendons crossing the forefoot appear to be intact. Soft tissues Soft tissue swelling over the dorsum of the foot is identified. IMPRESSION: 1. Non specific bone marrow edema of the midfoot as above described without frank bone destruction nor apparent fracture. Findings may be secondary to an inflammatory arthropathy, calcium pyrophosphate deposition disease, or potentially early Charcot arthropathy in diabetic patient some considerations including overuse/stress response though the distribution would be somewhat unusual. No apparent joint effusion. No definite evidence of a septic arthropathy. Clinical correlation suggested. 2. Mild soft tissue swelling over the dorsum foot without drainable fluid collection or abscess. No evidence of pyomyositis. Electronically Signed   By: Tollie Ethavid  Kwon M.D.   On: 07/24/2018 19:44   Mr Ankle Left W Wo Contrast  Result Date: 07/24/2018 CLINICAL DATA:  Pain and difficulty weight-bearing. EXAM: MRI OF THE LEFT ANKLE WITHOUT AND WITH CONTRAST TECHNIQUE: Multiplanar, multisequence MR imaging of the ankle was performed before and after the administration of intravenous contrast.  CONTRAST:  7 mL Gadavist IV COMPARISON:  Foot radiographs 07/19/2017 FINDINGS: TENDONS Peroneal: Intact and of normal signal intensity morphology. Posteromedial: Intact Anterior: Intact Achilles: Intact Plantar Fascia: Intact LIGAMENTS Lateral: Intact. Medial: Intact. CARTILAGE Ankle Joint: No joint effusion. Subtalar Joints/Sinus Tarsi: No joint effusion or chondral defect. Bones: Marrow edema of the midfoot as described on the forefoot MRI. These involve the cuboid, cuneiform, navicular and base second through fourth metatarsals. Other: Mild dorsal soft tissue swelling of the included midfoot. IMPRESSION: IMPRESSION 1. Bone marrow edema of the midfoot. Differential considerations would an inflammatory arthropathy, early Charcot arthropathy, overuse, less likely septic arthropathy  among some considerations. No definite fracture or suspicious osseous lesions. 2. Mild dorsal soft tissue swelling of the included midfoot without drainable fluid collection or abscess. 3. Intact tendons and ligaments crossing the ankle joint. Electronically Signed   By: Tollie Ethavid  Kwon M.D.   On: 07/24/2018 19:48   Dg Hand Complete Right  Result Date: 07/19/2018 CLINICAL DATA:  Right hand pain and swelling.  No injury. EXAM: RIGHT HAND - COMPLETE 3+ VIEW COMPARISON:  None. FINDINGS: No fracture or bone lesion. Joints are normally spaced and aligned.  No arthropathic changes. Normal soft tissues. IMPRESSION: Negative. Electronically Signed   By: Amie Portlandavid  Ormond M.D.   On: 07/19/2018 16:49   Dg Foot Complete Left  Result Date: 07/19/2018 CLINICAL DATA:  Left foot pain and swelling.  No injury. EXAM: LEFT FOOT - COMPLETE 3+ VIEW COMPARISON:  None. FINDINGS: No fracture or bone lesion. Joints are normally spaced and aligned.  No arthropathic changes. Soft tissues are unremarkable. IMPRESSION: Negative. Electronically Signed   By: Amie Portlandavid  Ormond M.D.   On: 07/19/2018 16:48    Microbiology: Recent Results (from the past 240 hour(s))  Culture, blood (routine x 2)     Status: None   Collection Time: 07/20/18  2:48 PM  Result Value Ref Range Status   Specimen Description   Final    BLOOD RIGHT ANTECUBITAL Performed at [facility], [address] [place], [place] [address]    Special Requests   Final    AB Blood Culture adequate volume Performed at [facility], [address] [place], [place] [address]    Culture   Final    NO GROWTH 5 DAYS Performed at [facility], [address] [place], [place] [address]    Report Status 07/25/2018 FINAL  Final  Culture, blood (routine x 2)     Status: None   Collection Time: 07/20/18  2:48 PM  Result Value Ref Range Status   Specimen Description   Final    BLOOD LEFT ANTECUBITAL Performed at [facility], [address] [place], [place] [address]    Special Requests   Final    BOTTLES DRAWN AEROBIC ONLY Blood Culture adequate volume Performed at [facility], [address] [place], [place] [address]    Culture   Final    NO GROWTH 5 DAYS Performed at [facility], [address] [place], [place] [address]    Report Status 07/25/2018 FINAL  Final     Labs: Basic Metabolic Panel: Recent Labs  Lab 07/22/18 0343 07/23/18 0823 07/24/18 0819 07/26/18 0432  NA 136 138 137  --   K 5.0 4.8 4.9  --   CL 103 104 106  --   CO2 23 24 25   --   GLUCOSE 86 101* 102*  --  BUN 12 14 18   --   CREATININE 0.81 0.77 0.81 0.85  CALCIUM 9.0 9.3 9.1  --   MG 2.2 2.3 2.3  --    Liver Function Tests: No results for input(s): AST, ALT, ALKPHOS, BILITOT, PROT, ALBUMIN in the last 168 hours. No results for input(s): LIPASE, AMYLASE in the last 168 hours. No results for input(s): AMMONIA in the last 168 hours. CBC: Recent Labs  Lab 07/22/18 0343 07/23/18 0823 07/24/18 0819  WBC 11.3* 7.5 5.7  NEUTROABS 8.0* 5.3 4.0  HGB 12.3* 13.0 12.6*  HCT 38.4* 39.6 38.9*  MCV 92.3 90.2 89.2  PLT 555* 648* 689*   Cardiac Enzymes: No results for input(s): CKTOTAL, CKMB, CKMBINDEX, TROPONINI in the last 168 hours. BNP: BNP (last 3 results) No results for input(s): BNP in the last 8760 hours.  ProBNP (last 3 results) No results for input(s): PROBNP in the last 8760 hours.  CBG: No results for input(s): GLUCAP in the last 168 hours.     Signed:  Albertine Grates MD, PhD  [facility] 07/28/2018, 10:53 AM

## 2018-07-29 ENCOUNTER — Encounter: Payer: Self-pay | Admitting: Infectious Diseases

## 2018-07-29 ENCOUNTER — Ambulatory Visit (INDEPENDENT_AMBULATORY_CARE_PROVIDER_SITE_OTHER): Payer: Medicaid Other | Admitting: Infectious Diseases

## 2018-07-29 VITALS — BP 133/71 | HR 98 | Temp 98.8°F | Wt 146.0 lb

## 2018-07-29 DIAGNOSIS — F419 Anxiety disorder, unspecified: Secondary | ICD-10-CM | POA: Diagnosis not present

## 2018-07-29 DIAGNOSIS — Z7251 High risk heterosexual behavior: Secondary | ICD-10-CM | POA: Diagnosis not present

## 2018-07-29 DIAGNOSIS — A5442 Gonococcal arthritis: Secondary | ICD-10-CM

## 2018-07-29 DIAGNOSIS — A64 Unspecified sexually transmitted disease: Secondary | ICD-10-CM

## 2018-07-29 DIAGNOSIS — A5486 Gonococcal sepsis: Secondary | ICD-10-CM

## 2018-07-29 DIAGNOSIS — Z7722 Contact with and (suspected) exposure to environmental tobacco smoke (acute) (chronic): Secondary | ICD-10-CM | POA: Diagnosis not present

## 2018-07-29 DIAGNOSIS — Z23 Encounter for immunization: Secondary | ICD-10-CM

## 2018-07-29 DIAGNOSIS — Z7252 High risk homosexual behavior: Secondary | ICD-10-CM

## 2018-07-29 DIAGNOSIS — M130 Polyarthritis, unspecified: Secondary | ICD-10-CM

## 2018-07-29 MED ORDER — CEFTRIAXONE SODIUM 250 MG IJ SOLR
500.0000 mg | Freq: Once | INTRAMUSCULAR | Status: AC
  Administered 2018-07-29: 500 mg via INTRAMUSCULAR

## 2018-07-29 NOTE — Progress Notes (Signed)
Patient: Dwayne Sandoval  DOB: 08/23/1995 MRN: [id number] PCP: [facility], [facility] (Inactive)    Patient Active Problem List   Diagnosis Date Noted  . High risk sexual behavior 07/30/2018  . DGI (disseminated gonococcal infection) (HCC)   . 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 He fortunately did not have any drainable/organized fluid collections of these sites that required surgery and was treated with pain management and IV Ceftriaxone (received 7 days inpatient). Due to high risk sexual behavior he was also advised to start on Descovy for PrEP after HIV rna was negative. He has not picked this up yet as he is more concerned about curing present infection. He does want to continue this for HIV prevention.   Since discharge he has had worsened pain in the left anterior forefoot. Can barely get a sock on and off. Using crutches to walk and taking ibuprofen/tylenol for pain. Does not do much - just stays at home and only gets up when he needs to eat or use the bathroom. He is not having any fevers or chills. No rashes.   Review of Systems  Constitutional: Negative for chills, fever, malaise/fatigue and weight loss.  HENT: Negative for sore throat.        No dental problems  Respiratory: Negative for cough and sputum production.   Cardiovascular: Negative for chest pain and leg swelling.  Gastrointestinal: Negative for abdominal pain, diarrhea and vomiting.  Genitourinary: Negative for dysuria and flank pain.  Musculoskeletal: Positive for joint pain. Negative for myalgias and neck pain.  Skin: Negative for rash.  Neurological: Negative for dizziness, tingling and headaches.  Psychiatric/Behavioral: Negative for depression and substance abuse. The patient  is nervous/anxious. The patient does not have insomnia.     Past Medical History:  Diagnosis Date  . [name] syndrome [facility])     Outpatient Medications Prior to Visit  Medication Sig Dispense Refill  . acetaminophen (TYLENOL) 500 MG tablet Take 2 tablets (1,000 mg total) by mouth every 6 (six) hours as needed for mild pain, moderate pain, fever or headache. 30 tablet 0  . emtricitabine-tenofovir AF (DESCOVY) 200-25 MG tablet Take 1 tablet by mouth daily. 30 tablet 3   No facility-administered medications prior to visit.      No Known Allergies  Social History   Tobacco Use  . Smoking status: Passive Smoke Exposure - Never Smoker  . Smokeless tobacco: Never Used  Substance Use Topics  . Alcohol use: Yes    Comment: occ  . Drug use: Never    Family History  Problem Relation Age of Onset  . Asthma Mother     Objective:   Vitals:   07/29/18 1600  BP: 133/71  Pulse: 98  Temp: 98.8 F (37.1 C)  Weight: 146 lb (66.2 kg)   Body mass index is 20.95 kg/m.  Physical Exam HENT:     Mouth/Throat:     Mouth: No oral lesions.     Dentition: Normal dentition. No dental caries.  Eyes:     General: No scleral icterus. Cardiovascular:     Rate and Rhythm: Normal rate and regular rhythm.     Heart sounds: Normal heart sounds.  Pulmonary:     Effort: Pulmonary effort is normal.  Breath sounds: Normal breath sounds.  Abdominal:     General: There is no distension.     Palpations: Abdomen is soft.     Tenderness: There is no abdominal tenderness.  Musculoskeletal:     Comments: Right wrist pain/swelling resolved. Full range of motion Left anterior midfoot with pain over metatarsals; no swelling, warmth or erythema present. Very tender to the touch. Can barely get sock on and off.  Lymphadenopathy:     Cervical: No cervical adenopathy.  Skin:    General: Skin is warm and dry.     Findings: No rash.  Neurological:     Mental Status: He is alert and oriented to  person, place, and time.     Lab Results: Lab Results  Component Value Date   WBC 5.7 07/24/2018   HGB 12.6 (L) 07/24/2018   HCT 38.9 (L) 07/24/2018   MCV 89.2 07/24/2018   PLT 689 (H) 07/24/2018    Lab Results  Component Value Date   CREATININE 0.85 07/26/2018   BUN 18 07/24/2018   NA 137 07/24/2018   K 4.9 07/24/2018   CL 106 07/24/2018   CO2 25 07/24/2018    Lab Results  Component Value Date   ALT 31 07/20/2018   AST 37 07/20/2018   ALKPHOS 92 07/20/2018   BILITOT 0.9 07/20/2018     Assessment & Plan:   Problem List Items Addressed This Visit      Unprioritized   Polyarthritis of multiple sites   Relevant Orders   Sedimentation rate   C-reactive protein   DGI (disseminated gonococcal infection) (HCC) - Primary    Has received 7 days of ceftriaxone 2 gm IV while inpatient. Will continue to treat minimum 10 days total with regimen but may need 14 days. He will receive 1 gm IM ceftriaxone daily in ID office x 3 doses with reassessment on Thursday with labs. There was no abscess on foot MRI only bone marrow edema and generalized soft tissue swelling. Can offer IV option in the office if he does not wish to continue intramuscular injections. May need to consider oral cefixime 400 mg QD to bridge the weekend if he needs continued treatment.       Relevant Medications   cefTRIAXone (ROCEPHIN) injection 500 mg (Completed)   cefTRIAXone (ROCEPHIN) injection 500 mg (Completed)   High risk sexual behavior    Discussed briefly purpose of PrEP and proper use with consistent daily use during periods of sexual activity. Will have him return to meet with Cassie for ongoing treatment. Continue Descovy       Other Visit Diagnoses    Sexually transmitted disease       Relevant Medications   cefTRIAXone (ROCEPHIN) injection 500 mg (Completed)   cefTRIAXone (ROCEPHIN) injection 500 mg (Completed)      [name], MSN, NP-C [facility] for Infectious Disease [facility] Pager: [phone] Office: [phone]  07/30/18  10:21 PM

## 2018-07-29 NOTE — Patient Instructions (Signed)
Will need to continue your shots today, Wednesday and Thursday.   Thursday we will check some blood work and see how you are feeling to see if we need to give another shot Friday and possibly consider antibiotic pills over the weekend.   Please schedule a follow up appointment next Wednesday/Thursday with Tammy Sours or Dr. Drue Second to check in again.

## 2018-07-30 ENCOUNTER — Ambulatory Visit: Payer: Medicaid Other

## 2018-07-30 DIAGNOSIS — Z7251 High risk heterosexual behavior: Secondary | ICD-10-CM | POA: Insufficient documentation

## 2018-07-30 DIAGNOSIS — A5486 Gonococcal sepsis: Secondary | ICD-10-CM

## 2018-07-30 MED ORDER — CEFTRIAXONE SODIUM 250 MG IJ SOLR
500.0000 mg | Freq: Once | INTRAMUSCULAR | Status: AC
  Administered 2018-07-31: 500 mg via INTRAMUSCULAR

## 2018-07-30 MED ORDER — CEFTRIAXONE SODIUM 250 MG IJ SOLR
500.0000 mg | Freq: Once | INTRAMUSCULAR | Status: AC
  Administered 2018-07-30 (×2): 500 mg via INTRAMUSCULAR

## 2018-07-30 NOTE — Progress Notes (Signed)
Patient here for second dose of Rocephin 1000 mgs per Dr Drue Second.   Rocephin given 1000 mg IM left and right gluteus.  Laurell Josephs, RN

## 2018-07-30 NOTE — Assessment & Plan Note (Signed)
Discussed briefly purpose of PrEP and proper use with consistent daily use during periods of sexual activity. Will have him return to meet with Cassie for ongoing treatment. Continue Descovy

## 2018-07-30 NOTE — Assessment & Plan Note (Addendum)
Has received 7 days of ceftriaxone 2 gm IV while inpatient. Will continue to treat minimum 10 days total with regimen but may need 14 days. He will receive 1 gm IM ceftriaxone daily in ID office x 3 doses with reassessment on Thursday with labs. There was no abscess on foot MRI only bone marrow edema and generalized soft tissue swelling. Can offer IV option in the office if he does not wish to continue intramuscular injections. May need to consider oral cefixime 400 mg QD to bridge the weekend if he needs continued treatment.

## 2018-07-31 ENCOUNTER — Ambulatory Visit (INDEPENDENT_AMBULATORY_CARE_PROVIDER_SITE_OTHER): Payer: Medicaid Other

## 2018-07-31 ENCOUNTER — Ambulatory Visit (INDEPENDENT_AMBULATORY_CARE_PROVIDER_SITE_OTHER): Payer: Medicaid Other | Admitting: Pharmacist

## 2018-07-31 ENCOUNTER — Encounter: Payer: Self-pay | Admitting: *Deleted

## 2018-07-31 ENCOUNTER — Ambulatory Visit: Payer: Medicaid Other

## 2018-07-31 ENCOUNTER — Other Ambulatory Visit: Payer: Medicaid Other

## 2018-07-31 DIAGNOSIS — Z7252 High risk homosexual behavior: Secondary | ICD-10-CM | POA: Diagnosis not present

## 2018-07-31 DIAGNOSIS — M130 Polyarthritis, unspecified: Secondary | ICD-10-CM

## 2018-07-31 DIAGNOSIS — A5486 Gonococcal sepsis: Secondary | ICD-10-CM | POA: Diagnosis not present

## 2018-07-31 MED ORDER — CEFTRIAXONE SODIUM 500 MG IJ SOLR: 500.0000 mg | Freq: Once | INTRAMUSCULAR | Status: DC

## 2018-07-31 MED ORDER — EMTRICITABINE-TENOFOVIR AF 200-25 MG PO TABS: 1.0000 | ORAL_TABLET | Freq: Every day | ORAL | 2 refills | Status: DC

## 2018-07-31 MED ORDER — CEFTRIAXONE SODIUM 250 MG IJ SOLR
500.0000 mg | Freq: Once | INTRAMUSCULAR | Status: AC
  Administered 2018-07-31: 500 mg via INTRAMUSCULAR

## 2018-07-31 MED ORDER — CEFTRIAXONE SODIUM 250 MG IJ SOLR: 250.0000 mg | Freq: Once | INTRAMUSCULAR | Status: DC

## 2018-07-31 MED ORDER — CEFTRIAXONE SODIUM 250 MG IJ SOLR: 500.0000 mg | Freq: Once | INTRAMUSCULAR | Status: DC

## 2018-07-31 MED FILL — DESCOVY 200-25 MG TABS: 200-25 | 30 days supply | Qty: 30 | Fill #0

## 2018-07-31 NOTE — Progress Notes (Signed)
Date:  07/31/2018   HPI: Dwayne Sandoval is a 23 y.o. male presenting to [facility] clinic for Rocephin injections.  Insured   [x]    Uninsured  []    Patient Active Problem List   Diagnosis Date Noted  . High risk sexual behavior 07/30/2018  . DGI (disseminated gonococcal infection) (HCC)   . 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  Previous Medications   ACETAMINOPHEN (TYLENOL) 500 MG TABLET    Take 2 tablets (1,000 mg total) by mouth every 6 (six) hours as needed for mild pain, moderate pain, fever or headache.  Modified Medications   No medications on file  Discontinued Medications   EMTRICITABINE-TENOFOVIR AF (DESCOVY) 200-25 MG TABLET    Take 1 tablet by mouth daily.    Allergies: No Known Allergies  Past Medical History: Past Medical History:  Diagnosis Date  . 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 Marital status: Single    Spouse name: Not on file  . Number of children: Not on file  . Years of education: Not on file  . Highest education level: Not on file  Occupational History  . Not on file  Social Needs  . Financial resource strain: Not on file  . Food insecurity:    Worry: Not on file    Inability: Not on file  . Transportation needs:    Medical: Not on file    Non-medical: Not on file  Tobacco Use  . Smoking status: Passive Smoke Exposure - Never Smoker  . Smokeless tobacco: Never Used  Substance and Sexual Activity  . Alcohol use: Yes    Comment: occ  . Drug use: Never  . Sexual activity: Not on file  Lifestyle  . Physical activity:    Days per week: Not on file    Minutes per session: Not on file  . Stress: Not on file  Relationships  . Social connections:    Talks on phone: Not on file    Gets together: Not on file    Attends religious service: Not on file     Active member of club or organization: Not on file    Attends meetings of clubs or organizations: Not on file    Relationship status: Not on file  Other Topics Concern  . Not on file  Social History Narrative  . Not on file    No flowsheet data found.  Labs:  SCr: Lab Results  Component Value Date   CREATININE 0.85 07/26/2018   CREATININE 0.81 07/24/2018   CREATININE 0.77 07/23/2018   CREATININE 0.81 07/22/2018   CREATININE 0.75 07/21/2018   HIV Lab Results  Component Value Date   HIV Non Reactive 07/19/2018   Hepatitis B No results found for: HEPBSAB, HEPBSAG, HEPBCAB Hepatitis C No results found for: HEPCAB, HCVRNAPCRQN Hepatitis A No results found for: HAV RPR and STI Lab Results  Component Value Date   LABRPR Non Reactive 07/21/2018    STI Results GC CT  07/21/2018 Negative Negative  07/21/2018 **POSITIVE**(A) Negative    Assessment: Dwayne Sandoval is in clinic today to receive his Rocephin injections for disseminated gonorrhea. He was recently given a prescription for Descovy for PrEP after a negative HIV antibody from [facility] on 07/28/2018. He has not yet had this prescription filled. Discussed Descovy  with him and asked if he has any questions or concerns regarding the medication. Explained the process of PrEP here at the clinic that he will have to come in every 3 months for lab work and to assess his risk for acquiring HIV. He has no questions or concerns at this time. [facility]; patient wishes to have the medication sent to his home via [employer]. Confirmed patient address and phone number in Epic. Gave patient Cassie's card and advised to call with any questions or concerns that may arise.   Plan: -Descovy x3 months -Follow-up with Cassie in 3 months on 4/14 at 11:30   [facility] Parm[facility] 07/31/2018, 11:34 AM

## 2018-07-31 NOTE — Progress Notes (Signed)
Administration of Ceftriaxone, witnessed by Vonzell Schlatter, RN.  Gerarda Fraction, [place]

## 2018-07-31 NOTE — Progress Notes (Signed)
Patient was given 1000 mg of ceftriaxone left and right upper outer gluteus.  Patient tolerated procedure well.   He was administered ibuprofen 400 mgs after injection.   Laurell Josephs, RN

## 2018-08-01 LAB — SEDIMENTATION RATE: Sed Rate: 29 mm/h — ABNORMAL HIGH (ref 0–15)

## 2018-08-01 LAB — C-REACTIVE PROTEIN: CRP: 7.1 mg/L (ref <8.0)

## 2018-08-01 NOTE — Progress Notes (Signed)
Received 10 days of IV/IM Ceftriaxone for disseminated GC - inflammatory markers with good reduction and normalization of CRP. Will re-assess off antibiotics at upcoming appt in 4 days.

## 2018-08-05 ENCOUNTER — Ambulatory Visit: Payer: Medicaid Other | Admitting: Family

## 2018-08-06 ENCOUNTER — Ambulatory Visit: Payer: Medicaid Other | Admitting: Family

## 2018-08-06 NOTE — Progress Notes (Deleted)
   Subjective:    Patient ID: Dwayne Sandoval, male    DOB: June 08, 1996, 23 y.o.   MRN: [id number]  No chief complaint on file.   HPI:  Dwayne Sandoval is a 23 y.o. male who presents today for follow up of disseminated gonococcal infection.   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  Imaging with no fluid collections and generalized soft tissue inflammation. While in the hospital he received 7 days of ceftriaxone.  On follow up he continued to have pain in his left anterior forefoot and was ambulating on crutches due to the pain.He was given an additional 3 doses of IM ceftriaxone on consecutive days with the last being 07/31/18.  Inflammatory markers were decreased on 1/16 with ESR of 46 down to 29 and CRP 22 decreased to 7.1.  PrEP was discussed as an option given his high risk sexual behavior and negative HIV test.    No Known Allergies    Outpatient Medications Prior to Visit  Medication Sig Dispense Refill  . acetaminophen (TYLENOL) 500 MG tablet Take 2 tablets (1,000 mg total) by mouth every 6 (six) hours as needed for mild pain, moderate pain, fever or headache. 30 tablet 0  . emtricitabine-tenofovir AF (DESCOVY) 200-25 MG tablet Take 1 tablet by mouth daily. 30 tablet 2   No facility-administered medications prior to visit.      Past Medical History:  Diagnosis Date  . 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)      No past surgical history on file.     Review of Systems    Objective:    There were no vitals taken for this visit. Nursing note and vital signs reviewed.  Physical Exam     Assessment & Plan:   Problem List Items Addressed This Visit    None       I am having Toniann Fail maintain his acetaminophen and emtricitabine-tenofovir AF.   No orders of the  defined types were placed in this encounter.    Follow-up: No follow-ups on file.   Terri Piedra, MSN, FNP-C Nurse Practitioner [facility] for Infectious Disease [place] [facility] Office phone: [phone] Pager: [place] number: [phone]

## 2018-08-12 ENCOUNTER — Encounter: Payer: Self-pay | Admitting: Family

## 2018-08-12 ENCOUNTER — Ambulatory Visit (INDEPENDENT_AMBULATORY_CARE_PROVIDER_SITE_OTHER): Payer: Medicaid Other | Admitting: Family

## 2018-08-12 VITALS — BP 114/71 | Temp 97.9°F | Ht 71.0 in | Wt 144.5 lb

## 2018-08-12 DIAGNOSIS — M130 Polyarthritis, unspecified: Secondary | ICD-10-CM | POA: Diagnosis not present

## 2018-08-12 DIAGNOSIS — Z7252 High risk homosexual behavior: Secondary | ICD-10-CM

## 2018-08-12 DIAGNOSIS — A5486 Gonococcal sepsis: Secondary | ICD-10-CM

## 2018-08-12 NOTE — Progress Notes (Signed)
Subjective:    Patient ID: Dwayne Sandoval, male    DOB: June 01, 1996, 23 y.o.   MRN: [id number]  Chief Complaint  Patient presents with  . Disseminated Gonococcal Infection     HPI:  Dwayne Sandoval is a 23 y.o. male who presents today for follow up office visit.   Dwayne Sandoval was last seen in the office on 07/29/18 for disseminated gonococcal infection and polyarthritis of multiple joints. He completed his additional ceftriaxone injections on 07/31/18 for a total of 10 days of therapy. He has cancelled 2 appointments since that time. It was also recommended to start on Descovy for PrEP.   Dwayne Sandoval is improved since completing his 10 days of therapy, however continues to have pain located on the dorsum of the left foot. He is able to ambulate with need for crutches or cane and is currently wearing a tennis shoe. No fevers, chills or sweats. No pain in his hands. Not currently sexually active. Has taken the Descovy and notes that he experienced chest pain and required Tylenol to help resolve his symptoms.    No Known Allergies    Outpatient Medications Prior to Visit  Medication Sig Dispense Refill  . acetaminophen (TYLENOL) 500 MG tablet Take 2 tablets (1,000 mg total) by mouth every 6 (six) hours as needed for mild pain, moderate pain, fever or headache. 30 tablet 0  . emtricitabine-tenofovir AF (DESCOVY) 200-25 MG tablet Take 1 tablet by mouth daily. 30 tablet 2   No facility-administered medications prior to visit.      Past Medical History:  Diagnosis Date  . 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 No pertinent surgical history.     Review of Systems  Constitutional: Negative for chills, diaphoresis and fever.  Respiratory: Negative for chest tightness, shortness of breath and wheezing.   Cardiovascular: Negative for chest pain and leg swelling.  Musculoskeletal:       Positive for left foot pain.       Objective:    BP 114/71   Temp 97.9 F (36.6 C)  (Oral)   Ht 5\' 11"  (1.803 m)   Wt 144 lb 8 oz (65.5 kg)   BMI 20.15 kg/m  Nursing note and vital signs reviewed.  Physical Exam Constitutional:      General: He is not in acute distress.    Appearance: He is well-developed.  Cardiovascular:     Rate and Rhythm: Normal rate and regular rhythm.     Heart sounds: Normal heart sounds.  Pulmonary:     Effort: Pulmonary effort is normal.     Breath sounds: Normal breath sounds.  Musculoskeletal:     Comments: Left foot with no obvious deformity, discoloration or edema. There is a small mobile cyst/lesion located on the dorsal lateral foot that is non-painful. Pulses and sensation are intact and appropriate.   Skin:    General: Skin is warm and dry.  Neurological:     Mental Status: He is alert and oriented to person, place, and time.  Psychiatric:        Behavior: Behavior normal.        Thought Content: Thought content normal.        Judgment: Judgment normal.        Assessment & Plan:   Problem List Items Addressed This Visit      Musculoskeletal and Integument   Polyarthritis of multiple sites     Other   DGI (disseminated gonococcal infection) (HCC) -  Primary    Appears to be improved with 10 days of therapy with Ceftriaxone. Continues to have foot pain and question if this is a sequela of infection. Recommend follow up with orthopedics for further assessment. Dr. Merrilee Seashore office information provided as he saw him during hospitalization. No additional antibiotic therapy is necessary at this time. Follow up with ID as needed.       High risk sexual behavior    Dwayne Sandoval has tried to take the Descovy which resulted in him having chest pain. He does not wish to continue with PrEP at this time. Discussed importance of safe sexual practice to reduce risk of acquisition or transmission of STI.  Condoms provided. Recommend frequent routine STI screening.           I am having Dwayne Sandoval maintain his acetaminophen and  emtricitabine-tenofovir AF.   Follow-up: Return if symptoms worsen or fail to improve.   Marcos Eke, MSN, FNP-C Nurse Practitioner [facility] for Infectious Disease [facility] Office phone: [phone] Pager: [phone] [facility] Main number: [phone]

## 2018-08-12 NOTE — Assessment & Plan Note (Signed)
Dwayne Sandoval has tried to take the Descovy which resulted in him having chest pain. He does not wish to continue with PrEP at this time. Discussed importance of safe sexual practice to reduce risk of acquisition or transmission of STI.  Condoms provided. Recommend frequent routine STI screening.

## 2018-08-12 NOTE — Patient Instructions (Addendum)
Nice to meet you.  Recommend following up with Dr. Merlyn Lot for additional evaluation and treatment.   There is no indication for additional antibiotics at this time.  Recommend routine STI/STD screening with your PCP.  Follow up with ID as needed.   Betha Loa, MD Follow up.   Specialty:  Orthopedic Surgery Contact information: [address] [place] [address] [phone]

## 2018-08-12 NOTE — Assessment & Plan Note (Signed)
Appears to be improved with 10 days of therapy with Ceftriaxone. Continues to have foot pain and question if this is a sequela of infection. Recommend follow up with orthopedics for further assessment. Dr. Merrilee Seashore office information provided as he saw him during hospitalization. No additional antibiotic therapy is necessary at this time. Follow up with ID as needed.

## 2018-09-28 ENCOUNTER — Other Ambulatory Visit: Payer: Self-pay

## 2018-09-28 ENCOUNTER — Encounter (HOSPITAL_COMMUNITY): Payer: Self-pay | Admitting: Emergency Medicine

## 2018-09-28 ENCOUNTER — Ambulatory Visit (HOSPITAL_COMMUNITY)
Admission: EM | Admit: 2018-09-28 | Discharge: 2018-09-28 | Disposition: A | Discharge status: Home / Self Care | Payer: Medicaid Other | Attending: Family Medicine | Admitting: Family Medicine

## 2018-09-28 DIAGNOSIS — J039 Acute tonsillitis, unspecified: Secondary | ICD-10-CM | POA: Insufficient documentation

## 2018-09-28 DIAGNOSIS — R0981 Nasal congestion: Secondary | ICD-10-CM | POA: Diagnosis not present

## 2018-09-28 DIAGNOSIS — R51 Headache: Secondary | ICD-10-CM | POA: Diagnosis not present

## 2018-09-28 LAB — POCT RAPID STREP A: Streptococcus, Group A Screen (Direct): NEGATIVE

## 2018-09-28 MED ORDER — ACETAMINOPHEN 325 MG PO TABS
ORAL_TABLET | ORAL | Status: AC
  Filled 2018-09-28: qty 3

## 2018-09-28 MED ORDER — ACETAMINOPHEN 500 MG PO TABS
1000.0000 mg | ORAL_TABLET | Freq: Once | ORAL | Status: AC
  Administered 2018-09-28: 1000 mg via ORAL

## 2018-09-28 MED ORDER — AMOXICILLIN-POT CLAVULANATE 875-125 MG PO TABS: 1.0000 | ORAL_TABLET | Freq: Two times a day (BID) | ORAL | 0 refills | Status: DC

## 2018-09-28 NOTE — Discharge Instructions (Addendum)
Your strep test was negative.  We will send for culture Based on symptoms and high fever them required and treat with antibiotics. Amoxicillin daily for 7 days  You can do Tylenol or ibuprofen for fever and pain Follow up as needed for continued or worsening symptoms

## 2018-09-28 NOTE — ED Triage Notes (Signed)
Pt presents to [facility] for runny nose, sore throat and feeling woozy x 1 week.  Patient is requesting to be tested for COVID.

## 2018-09-28 NOTE — ED Provider Notes (Signed)
[facility]    CSN: [id number] Arrival date & time: 09/28/18  1549     History   Chief Complaint Chief Complaint  Patient presents with  . Nasal Congestion    HPI Dwayne Sandoval is a 23 y.o. male.   She is a 23 year old male who presents with sore throat, high fever, headaches.  This has been present and worsening over the past 24 hours.  Denies any cough, congestion but has some mucus in throat. Pain with swallowing.  He has not taken any medication for his symptoms.  Denies any recent traveling or sick contacts. No nausea, vomiting, diarrhea. He works in [employer].   ROS per HPI      Past Medical History:  Diagnosis Date  . 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 High risk sexual behavior 07/30/2018  . DGI (disseminated gonococcal infection) (HCC)   . 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 No pertinent surgical history.     Home Medications    Prior to Admission medications   Medication Sig Start Date End Date Taking? Authorizing Provider  acetaminophen (TYLENOL) 500 MG tablet Take 2 tablets (1,000 mg total) by mouth every 6 (six) hours as needed for mild pain, moderate pain, fever or headache. 07/12/18   Elpidio Anis, PA-C  amoxicillin-clavulanate (AUGMENTIN) 875-125 MG tablet Take 1 tablet by mouth every 12 (twelve) hours. 09/28/18   Dahlia Byes A, NP  emtricitabine-tenofovir AF (DESCOVY) 200-25 MG tablet Take 1 tablet by mouth daily. 07/31/18   Kuppelweiser, Cassie L, RPH-CPP    Family History Family History  Problem Relation Age of Onset  . Asthma Mother     Social History Social History   Tobacco Use  . Smoking status: Passive Smoke Exposure - Never Smoker  . Smokeless tobacco: Never Used  Substance Use Topics  . Alcohol use: Yes    Comment: occ  . Drug use: Never     Allergies   Patient has no known allergies.   Review of Systems Review of  Systems   Physical Exam Triage Vital Signs ED Triage Vitals  Enc Vitals Group     BP 09/28/18 1607 128/89     Pulse Rate 09/28/18 1607 (!) 105     Resp 09/28/18 1607 18     Temp 09/28/18 1607 (!) 102.7 F (39.3 C)     Temp Source 09/28/18 1607 Oral     SpO2 09/28/18 1607 97 %     Weight --      Height --      Head Circumference --      Peak Flow --      Pain Score 09/28/18 1608 9     Pain Loc --      Pain Edu? --      Excl. in GC? --    No data found.  Updated Vital Signs BP 128/89 (BP Location: Right Arm)   Pulse (!) 105   Temp (!) 102.7 F (39.3 C) (Oral)   Resp 18   SpO2 97%   Visual Acuity Right Eye Distance:   Left Eye Distance:   Bilateral Distance:    Right Eye Near:   Left Eye Near:    Bilateral Near:     Physical Exam Vitals signs and nursing note reviewed.  Constitutional:      Appearance: Normal appearance. He is ill-appearing.  HENT:     Head:  Normocephalic and atraumatic.     Right Ear: Tympanic membrane and ear canal normal.     Left Ear: Tympanic membrane and ear canal normal.     Mouth/Throat:     Pharynx: Posterior oropharyngeal erythema present.     Tonsils: Tonsillar exudate present. Swelling: 3+ on the right. 3+ on the left.  Eyes:     Conjunctiva/sclera: Conjunctivae normal.  Neck:     Musculoskeletal: Normal range of motion.  Cardiovascular:     Rate and Rhythm: Normal rate and regular rhythm.     Pulses: Normal pulses.     Heart sounds: Normal heart sounds.  Pulmonary:     Effort: Pulmonary effort is normal.     Breath sounds: Normal breath sounds.  Musculoskeletal: Normal range of motion.  Skin:    General: Skin is warm and dry.  Neurological:     Mental Status: He is alert.  Psychiatric:        Mood and Affect: Mood normal.      UC Treatments / Results  Labs (all labs ordered are listed, but only abnormal results are displayed) Labs Reviewed  CULTURE, GROUP A STREP [facility])  POCT RAPID STREP A    EKG None   Radiology No results found.  Procedures Procedures (including critical care time)  Medications Ordered in UC Medications  acetaminophen (TYLENOL) tablet 1,000 mg (1,000 mg Oral Given 09/28/18 1625)    Initial Impression / Assessment and Plan / UC Course  I have reviewed the triage vital signs and the nursing notes.  Pertinent labs & imaging results that were available during my care of the patient were reviewed by me and considered in my medical decision making (see chart for details).     Tonsillitis  Rapid strep negative Based on fever, tonsillar swelling with exudates and symptoms I will go ahead and treat. Will send for culture Amoxicillin twice a day for next 7 days Tylenol or ibuprofen as needed for fever Follow up as needed for continued or worsening symptoms  Final Clinical Impressions(s) / UC Diagnoses   Final diagnoses:  Acute tonsillitis, unspecified etiology     Discharge Instructions     Your strep test was negative.  We will send for culture Based on symptoms and high fever them required and treat with antibiotics. Amoxicillin daily for 7 days  You can do Tylenol or ibuprofen for fever and pain Follow up as needed for continued or worsening symptoms     ED Prescriptions    Medication Sig Dispense Auth. Provider   amoxicillin-clavulanate (AUGMENTIN) 875-125 MG tablet Take 1 tablet by mouth every 12 (twelve) hours. 14 tablet Janace Aris, NP     Controlled Substance Prescriptions [place] Controlled Substance Registry consulted? no   Janace Aris, NP 09/28/18 1646

## 2018-09-29 ENCOUNTER — Emergency Department (HOSPITAL_COMMUNITY)
Admission: EM | Admit: 2018-09-29 | Discharge: 2018-09-29 | Disposition: A | Discharge status: Home / Self Care | Payer: Medicaid Other | Attending: Emergency Medicine | Admitting: Emergency Medicine

## 2018-09-29 ENCOUNTER — Other Ambulatory Visit: Payer: Self-pay

## 2018-09-29 ENCOUNTER — Encounter (HOSPITAL_COMMUNITY): Payer: Self-pay

## 2018-09-29 DIAGNOSIS — J069 Acute upper respiratory infection, unspecified: Secondary | ICD-10-CM | POA: Insufficient documentation

## 2018-09-29 DIAGNOSIS — Z79899 Other long term (current) drug therapy: Secondary | ICD-10-CM | POA: Insufficient documentation

## 2018-09-29 DIAGNOSIS — Z7722 Contact with and (suspected) exposure to environmental tobacco smoke (acute) (chronic): Secondary | ICD-10-CM | POA: Insufficient documentation

## 2018-09-29 DIAGNOSIS — R509 Fever, unspecified: Secondary | ICD-10-CM | POA: Diagnosis present

## 2018-09-29 NOTE — ED Triage Notes (Signed)
Pt states that he has a cough, runny nose, fever, body aches, pain in his knees and upper back. Pt states that he wants to get tested for coronavirus because he is "sure I have it"

## 2018-09-29 NOTE — Discharge Instructions (Addendum)
Over the counter cough and cold medicines. Quarantine yourself for 2 weeks from start of symptoms. Follow up with your doctor or contact telehealth for further guidance.

## 2018-09-29 NOTE — ED Provider Notes (Signed)
[facility]-EMERGENCY DEPT Provider Note   CSN: [id number] Arrival date & time: 09/29/18  1830    History   Chief Complaint Chief Complaint  Patient presents with  . Fever    102.3 at home  . Sore Throat    swollen, tender to touch, redness    HPI Dwayne Sandoval is a 23 y.o. male.     23 year old male presents with complaint of body aches, cough, congestion, sore throat, headaches for the past 1 to 2 weeks.  Patient was seen at urgent care yesterday, temp of 102.7, had a negative strep test and was discharged home on Augmentin for his exudative tonsillitis.  Patient states that he is googled his symptoms and is concerned he has coronavirus.  Patient is a non-smoker, no travel to areas with significant outbreak, no known sick contacts.  No other complaints or concerns.     Past Medical History:  Diagnosis Date  . 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 High risk sexual behavior 07/30/2018  . DGI (disseminated gonococcal infection) (HCC)   . 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 No pertinent surgical history.      Home Medications    Prior to Admission medications   Medication Sig Start Date End Date Taking? Authorizing Provider  acetaminophen (TYLENOL) 500 MG tablet Take 2 tablets (1,000 mg total) by mouth every 6 (six) hours as needed for mild pain, moderate pain, fever or headache. 07/12/18   Elpidio Anis, PA-C  amoxicillin-clavulanate (AUGMENTIN) 875-125 MG tablet Take 1 tablet by mouth every 12 (twelve) hours. 09/28/18   Dahlia Byes A, NP  emtricitabine-tenofovir AF (DESCOVY) 200-25 MG tablet Take 1 tablet by mouth daily. 07/31/18   Kuppelweiser, Cassie L, RPH-CPP    Family History Family History  Problem Relation Age of Onset  . Asthma Mother     Social History Social History   Tobacco Use  . Smoking status: Passive Smoke Exposure - Never Smoker  . Smokeless  tobacco: Never Used  Substance Use Topics  . Alcohol use: Yes    Comment: occ  . Drug use: Never     Allergies   Patient has no known allergies.   Review of Systems Review of Systems  Constitutional: Positive for fever. Negative for chills.  HENT: Positive for congestion, sneezing and sore throat. Negative for ear pain.   Eyes: Negative for discharge and redness.  Respiratory: Positive for cough. Negative for shortness of breath and wheezing.   Skin: Negative for rash and wound.  Allergic/Immunologic: Negative for immunocompromised state.  Neurological: Negative for headaches.  Hematological: Negative for adenopathy.  Psychiatric/Behavioral: Negative for confusion.  All other systems reviewed and are negative.    Physical Exam Updated Vital Signs BP 134/74 (BP Location: Left Arm)   Pulse 91   Temp 99.4 F (37.4 C) (Oral)   Resp 20   Ht 5\' 11"  (1.803 m)   Wt 63.5 kg   SpO2 100%   BMI 19.53 kg/m   Physical Exam Vitals signs and nursing note reviewed.  Constitutional:      General: He is not in acute distress.    Appearance: He is well-developed. He is not diaphoretic.  HENT:     Head: Normocephalic and atraumatic.     Right Ear: Tympanic membrane and ear canal normal.     Left Ear: Tympanic membrane and ear canal normal.     Nose:  Congestion present. No rhinorrhea.     Mouth/Throat:     Mouth: Mucous membranes are moist. No oral lesions.     Pharynx: Uvula midline. No pharyngeal swelling.     Tonsils: Tonsillar exudate present. No tonsillar abscesses. Swelling: 1+ on the right. 1+ on the left.  Neck:     Musculoskeletal: Neck supple.  Cardiovascular:     Rate and Rhythm: Normal rate and regular rhythm.     Heart sounds: Normal heart sounds. No murmur.  Pulmonary:     Effort: Pulmonary effort is normal.  Lymphadenopathy:     Cervical: No cervical adenopathy.  Skin:    General: Skin is warm and dry.     Findings: No rash.  Neurological:     Mental  Status: He is alert and oriented to person, place, and time.  Psychiatric:        Behavior: Behavior normal.      ED Treatments / Results  Labs (all labs ordered are listed, but only abnormal results are displayed) Labs Reviewed - No data to display  EKG None  Radiology No results found.  Procedures Procedures (including critical care time)  Medications Ordered in ED Medications - No data to display   Initial Impression / Assessment and Plan / ED Course  I have reviewed the triage vital signs and the nursing notes.  Pertinent labs & imaging results that were available during my care of the patient were reviewed by me and considered in my medical decision making (see chart for details).  Clinical Course as of Sep 28 2056  Mon Sep 29, 2018  2831 23 year old male with concern for coronavirus.  Patient states that he has had symptoms for 1 to 2 weeks.  On exam patient is well-appearing, no respiratory distress, lung sounds are clear.  Patient has light exudate on his tonsils which were tested for strep yesterday and found to be negative.  Patient was given prescription for Augmentin pending throat culture.  Patient does not meet testing criteria, is otherwise well-appearing, recommend supportive therapy and follow-up with telehealth or PCP.   [LM]    Clinical Course User Index [LM] Jeannie Fend, PA-C   Final Clinical Impressions(s) / ED Diagnoses   Final diagnoses:  Viral upper respiratory tract infection    ED Discharge Orders    None       Alden Hipp 09/29/18 2058    Maia Plan, MD 09/29/18 2356

## 2018-09-30 ENCOUNTER — Other Ambulatory Visit: Payer: Self-pay

## 2018-09-30 ENCOUNTER — Encounter (HOSPITAL_COMMUNITY): Payer: Self-pay

## 2018-09-30 ENCOUNTER — Emergency Department (HOSPITAL_COMMUNITY): Payer: Medicaid Other

## 2018-09-30 ENCOUNTER — Emergency Department (HOSPITAL_COMMUNITY)
Admission: EM | Admit: 2018-09-30 | Discharge: 2018-09-30 | Disposition: A | Discharge status: Home / Self Care | Payer: Medicaid Other | Attending: Emergency Medicine | Admitting: Emergency Medicine

## 2018-09-30 DIAGNOSIS — Z7722 Contact with and (suspected) exposure to environmental tobacco smoke (acute) (chronic): Secondary | ICD-10-CM | POA: Diagnosis not present

## 2018-09-30 DIAGNOSIS — M25561 Pain in right knee: Secondary | ICD-10-CM | POA: Diagnosis not present

## 2018-09-30 DIAGNOSIS — Z79899 Other long term (current) drug therapy: Secondary | ICD-10-CM | POA: Diagnosis not present

## 2018-09-30 LAB — BASIC METABOLIC PANEL
Anion gap: 7 (ref 5–15)
BUN: 8 mg/dL (ref 6–20)
CO2: 26 mmol/L (ref 22–32)
Calcium: 9.3 mg/dL (ref 8.9–10.3)
Chloride: 104 mmol/L (ref 98–111)
Creatinine, Ser: 0.93 mg/dL (ref 0.61–1.24)
GFR calc Af Amer: >60 mL/min (ref >60)
GFR calc non Af Amer: >60 mL/min (ref >60)
Glucose, Bld: 108 mg/dL — ABNORMAL HIGH (ref 70–99)
Potassium: 3.9 mmol/L (ref 3.5–5.1)
Sodium: 137 mmol/L (ref 135–145)

## 2018-09-30 LAB — CBC WITH DIFFERENTIAL/PLATELET
Abs Immature Granulocytes: 0.02 10*3/uL (ref 0.00–0.07)
Basophils Absolute: 0 10*3/uL (ref 0.0–0.1)
Basophils Relative: 1 %
Eosinophils Absolute: 0.2 10*3/uL (ref 0.0–0.5)
Eosinophils Relative: 2 %
HCT: 45.3 % (ref 39.0–52.0)
Hemoglobin: 14.9 g/dL (ref 13.0–17.0)
Immature Granulocytes: 0 %
Lymphocytes Relative: 24 %
Lymphs Abs: 1.9 10*3/uL (ref 0.7–4.0)
MCH: 28.7 pg (ref 26.0–34.0)
MCHC: 32.9 g/dL (ref 30.0–36.0)
MCV: 87.3 fL (ref 80.0–100.0)
Monocytes Absolute: 0.7 10*3/uL (ref 0.1–1.0)
Monocytes Relative: 8 %
Neutro Abs: 5.1 10*3/uL (ref 1.7–7.7)
Neutrophils Relative %: 65 %
Platelets: 275 10*3/uL (ref 150–400)
RBC: 5.19 MIL/uL (ref 4.22–5.81)
RDW: 12.6 % (ref 11.5–15.5)
WBC: 7.9 10*3/uL (ref 4.0–10.5)
nRBC: 0 % (ref 0.0–0.2)

## 2018-09-30 LAB — C-REACTIVE PROTEIN: CRP: 9.4 mg/dL — ABNORMAL HIGH (ref <1.0)

## 2018-09-30 LAB — SEDIMENTATION RATE: Sed Rate: 9 mm/hr (ref 0–16)

## 2018-09-30 NOTE — Discharge Instructions (Addendum)
Please read attached information. If you experience any new or worsening signs or symptoms please return to the emergency room for evaluation. Please follow-up with your primary care provider or specialist as discussed.  °

## 2018-09-30 NOTE — ED Triage Notes (Signed)
Pt states right knee pain, no known injury. States it hurts to bend his knee. No pain if leg is straight.

## 2018-09-30 NOTE — ED Provider Notes (Signed)
[facility] EMERGENCY DEPARTMENT Provider Note   CSN: [id number] Arrival date & time: 09/30/18  8032    History   Chief Complaint Chief Complaint  Patient presents with  . Knee Pain    HPI Dwayne Sandoval is a 23 y.o. male.     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  He notes bilateral knee pain on the last several days, woke up this morning with severe right-sided knee pain unable to flex.  Patient denies any trauma to the knee.  He is unable to localize the pain.  He denies any redness swelling fever or warmth.  Patient notes a significant past medical history of disseminated gonorrhea recently.  Patient notes that this was in his left foot and ankle, he notes this pain feels different than this.  He notes he is sexually active with male partners but uses protection.   Past Medical History:  Diagnosis Date  . [name] syndrome [facility])     Patient Active Problem List   Diagnosis Date Noted  . High risk sexual behavior 07/30/2018  . DGI (disseminated gonococcal infection) (HCC)   . 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 No pertinent surgical history.      Home Medications    Prior to Admission medications   Medication Sig Start Date End Date Taking? Authorizing Provider  acetaminophen (TYLENOL) 500 MG tablet Take 2 tablets (1,000 mg total) by mouth every 6 (six) hours as needed for mild pain, moderate pain, fever or headache. 07/12/18   Elpidio Anis, PA-C  amoxicillin-clavulanate (AUGMENTIN) 875-125 MG tablet Take 1 tablet by mouth every 12 (twelve) hours. 09/28/18   Dahlia Byes A, NP  emtricitabine-tenofovir AF (DESCOVY) 200-25 MG tablet Take 1 tablet by mouth daily. 07/31/18   Kuppelweiser, Cassie L, RPH-CPP    Family History Family History  Problem Relation Age of Onset  .  Asthma Mother     Social History Social History   Tobacco Use  . Smoking status: Passive Smoke Exposure - Never Smoker  . Smokeless tobacco: Never Used  Substance Use Topics  . Alcohol use: Yes    Comment: occ  . Drug use: Never     Allergies   Patient has no known allergies.   Review of Systems Review of Systems  All other systems reviewed and are negative.  Physical Exam Updated Vital Signs BP 120/81 (BP Location: Right Arm) Comment: Simultaneous filing. User may not have seen previous data. Comment (BP Location): Simultaneous filing. User may not have seen previous data.  Pulse 92 Comment: Simultaneous filing. User may not have seen previous data.  Temp 99.3 F (37.4 C) (Oral) Comment: Simultaneous filing. User may not have seen previous data. Comment (Src): Simultaneous filing. User may not have seen previous data.  Resp 18 Comment: Simultaneous filing. User may not have seen previous data.  SpO2 99% Comment: Simultaneous filing. User may not have seen previous data.  Physical Exam Vitals signs and nursing note reviewed.  Constitutional:      Appearance: He is well-developed.  HENT:     Head: Normocephalic and atraumatic.  Eyes:     General: No scleral icterus.       Right eye: No discharge.        Left eye: No discharge.     Conjunctiva/sclera: Conjunctivae normal.     Pupils: Pupils  are equal, round, and reactive to light.  Neck:     Musculoskeletal: Normal range of motion.     Vascular: No JVD.     Trachea: No tracheal deviation.  Pulmonary:     Effort: Pulmonary effort is normal.     Breath sounds: No stridor.  Musculoskeletal:     Comments: Right knee atraumatic without swelling or edema, no warmth- flexion to 100 degrees  Neurological:     Mental Status: He is alert and oriented to person, place, and time.     Coordination: Coordination normal.  Psychiatric:        Behavior: Behavior normal.        Thought Content: Thought content normal.         Judgment: Judgment normal.     ED Treatments / Results  Labs (all labs ordered are listed, but only abnormal results are displayed) Labs Reviewed  C-REACTIVE PROTEIN - Abnormal; Notable for the following components:      Result Value   CRP 9.4 (*)    All other components within normal limits  BASIC METABOLIC PANEL - Abnormal; Notable for the following components:   Glucose, Bld 108 (*)    All other components within normal limits  SEDIMENTATION RATE  CBC WITH DIFFERENTIAL/PLATELET    EKG None  Radiology Dg Knee Complete 4 Views Right  Result Date: 09/30/2018 CLINICAL DATA:  Knee pain.  No known injury. EXAM: RIGHT KNEE - COMPLETE 4+ VIEW COMPARISON:  No prior. FINDINGS: High-riding patella. No evidence of fracture dislocation. Soft tissues are unremarkable. No evidence of effusion. IMPRESSION: High-riding patella.  No evidence of fracture or dislocation. Electronically Signed   By: Maisie Fus  Register   On: 09/30/2018 09:50    Procedures Procedures (including critical care time)  Medications Ordered in ED Medications - No data to display   Initial Impression / Assessment and Plan / ED Course  I have reviewed the triage vital signs and the nursing notes.  Pertinent labs & imaging results that were available during my care of the patient were reviewed by me and considered in my medical decision making (see chart for details).        Labs: sed rate, CRP, WBC, BMP  Imaging: Dg knee complete right  Consults:  Therapeutics:  Discharge Meds:   Assessment/Plan: 24 year old male presents today with acute onset right knee pain.  He does have a history of disseminated gonorrhea.  He has no infectious symptoms presently.  Labs with no elevation in white count minimal elevation in CRP and normal sed rate.  Knee exam is normal.  Originally patient could not flex his knee, upon repeat evaluation he is comfortably able to flex the knee past 90 degrees.   Have low suspicion for  any acute abnormality including infection.  He is in no acute distress and is stable for outpatient follow-up.  Patient is given strict return precautions.  He voiced understanding and agreement to this plan had no further questions or concerns.    Final Clinical Impressions(s) / ED Diagnoses   Final diagnoses:  Acute pain of right knee    ED Discharge Orders    None       Rosalio Loud 09/30/18 1132    Sabas Sous, MD 10/01/18 [phone]

## 2018-10-01 LAB — CULTURE, GROUP A STREP (THRC)

## 2018-10-28 ENCOUNTER — Ambulatory Visit: Payer: Medicaid Other | Admitting: Pharmacist

## 2018-11-03 ENCOUNTER — Encounter (HOSPITAL_COMMUNITY): Payer: Self-pay | Admitting: Emergency Medicine

## 2018-11-03 ENCOUNTER — Emergency Department (HOSPITAL_COMMUNITY)
Admission: EM | Admit: 2018-11-03 | Discharge: 2018-11-03 | Disposition: A | Discharge status: Home / Self Care | Payer: Medicaid Other | Attending: Emergency Medicine | Admitting: Emergency Medicine

## 2018-11-03 ENCOUNTER — Other Ambulatory Visit: Payer: Self-pay

## 2018-11-03 DIAGNOSIS — Z7722 Contact with and (suspected) exposure to environmental tobacco smoke (acute) (chronic): Secondary | ICD-10-CM | POA: Insufficient documentation

## 2018-11-03 DIAGNOSIS — M7918 Myalgia, other site: Secondary | ICD-10-CM | POA: Insufficient documentation

## 2018-11-03 DIAGNOSIS — Z79899 Other long term (current) drug therapy: Secondary | ICD-10-CM | POA: Insufficient documentation

## 2018-11-03 DIAGNOSIS — M791 Myalgia, unspecified site: Secondary | ICD-10-CM | POA: Diagnosis not present

## 2018-11-03 MED ORDER — METHOCARBAMOL 500 MG PO TABS: 500.0000 mg | ORAL_TABLET | Freq: Two times a day (BID) | ORAL | 0 refills | Status: DC

## 2018-11-03 NOTE — Discharge Instructions (Addendum)
Please read attached information. If you experience any new or worsening signs or symptoms please return to the emergency room for evaluation. Please follow-up with your primary care provider or specialist as discussed. Please use medication prescribed only as directed and discontinue taking if you have any concerning signs or symptoms.   °
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
## 2018-12-18 DIAGNOSIS — H10021 Other mucopurulent conjunctivitis, right eye: Secondary | ICD-10-CM | POA: Diagnosis not present

## 2019-01-23 ENCOUNTER — Ambulatory Visit (HOSPITAL_COMMUNITY)
Admission: EM | Admit: 2019-01-23 | Discharge: 2019-01-23 | Disposition: A | Discharge status: Home / Self Care | Payer: Medicaid Other | Attending: Family Medicine | Admitting: Family Medicine

## 2019-01-23 ENCOUNTER — Encounter (HOSPITAL_COMMUNITY): Payer: Self-pay

## 2019-01-23 ENCOUNTER — Other Ambulatory Visit: Payer: Self-pay

## 2019-01-23 DIAGNOSIS — J029 Acute pharyngitis, unspecified: Secondary | ICD-10-CM | POA: Diagnosis not present

## 2019-01-23 DIAGNOSIS — J039 Acute tonsillitis, unspecified: Secondary | ICD-10-CM | POA: Insufficient documentation

## 2019-01-23 LAB — POCT RAPID STREP A: Streptococcus, Group A Screen (Direct): NEGATIVE

## 2019-01-23 NOTE — ED Provider Notes (Signed)
[facility]    CSN: [id number] Arrival date & time: 01/23/19  [id number]     History   Chief Complaint Chief Complaint  Patient presents with  . Sore Throat    HPI Dwayne Sandoval is a 23 y.o. male.   Dwayne Pitchyrese Takagi presents with complaints of sore throat. Stomach ache with constipation 1 week ago. This improved. Now with just sore throat. Pain with swallowing. Pain with eating. Pain worse to left side. No fevers. Occasional cough. No congestion. Works at [employer]. No known ill contacts. Hasn't taken any medications for symptoms. Denies any previous similar. Hx of disseminated gonococcal infection and high risk sexual behavior, endorses in engaging in oral intercourse.     ROS per HPI, negative if not otherwise mentioned.      Past Medical History:  Diagnosis Date  . 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 High risk sexual behavior 07/30/2018  . DGI (disseminated gonococcal infection) (HCC)   . 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 No pertinent surgical history.     Home Medications    Prior to Admission medications   Medication Sig Start Date End Date Taking? Authorizing Provider  acetaminophen (TYLENOL) 500 MG tablet Take 2 tablets (1,000 mg total) by mouth every 6 (six) hours as needed for mild pain, moderate pain, fever or headache. 07/12/18   Elpidio AnisUpstill, Shari, PA-C  emtricitabine-tenofovir AF (DESCOVY) 200-25 MG tablet Take 1 tablet by mouth daily. 07/31/18   Kuppelweiser, Cassie L, RPH-CPP    Family History Family History  Problem Relation Age of Onset  . Diabetes Mother     Social History Social History   Tobacco Use  . Smoking status: Passive Smoke Exposure - Never Smoker  . Smokeless tobacco: Never Used  Substance Use Topics  . Alcohol use: Yes    Comment: occ  . Drug use: Never     Allergies   Patient has no known allergies.   Review of Systems  Review of Systems   Physical Exam Triage Vital Signs ED Triage Vitals  Enc Vitals Group     BP 01/23/19 0918 107/70     Pulse Rate 01/23/19 0918 76     Resp 01/23/19 0918 16     Temp 01/23/19 0918 98.7 F (37.1 C)     Temp Source 01/23/19 0918 Oral     SpO2 01/23/19 0918 100 %     Weight --      Height --      Head Circumference --      Peak Flow --      Pain Score 01/23/19 0915 6     Pain Loc --      Pain Edu? --      Excl. in GC? --    No data found.  Updated Vital Signs BP 107/70 (BP Location: Right Arm)   Pulse 76   Temp 98.7 F (37.1 C) (Oral)   Resp 16   SpO2 100%    Physical Exam Constitutional:      Appearance: He is well-developed.  HENT:     Mouth/Throat:     Tonsils: Tonsillar exudate present. No tonsillar abscesses. 1+ on the right. 1+ on the left.     Comments: Scant exudate bilaterally without significant redness and minimal swelling  Cardiovascular:     Rate and Rhythm: Normal rate and regular rhythm.  Pulmonary:  Effort: Pulmonary effort is normal.     Breath sounds: Normal breath sounds.  Lymphadenopathy:     Cervical: No cervical adenopathy.  Skin:    General: Skin is warm and dry.  Neurological:     Mental Status: He is alert and oriented to person, place, and time.      UC Treatments / Results  Labs (all labs ordered are listed, but only abnormal results are displayed) Labs Reviewed  CULTURE, GROUP A STREP [facility])  POCT RAPID STREP A  CYTOLOGY, (ORAL, ANAL, URETHRAL) ANCILLARY ONLY    EKG   Radiology No results found.  Procedures Procedures (including critical care time)  Medications Ordered in UC Medications - No data to display  Initial Impression / Assessment and Plan / UC Course  I have reviewed the triage vital signs and the nursing notes.  Pertinent labs & imaging results that were available during my care of the patient were reviewed by me and considered in my medical decision making (see chart for details).      New onset sore throat, works in Human resources officer. 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 Isolation until results return recommended.  Negative rapid strep. Cytology also collected and pending. Supportive cares recommended. If symptoms worsen or do not improve in the next week to return to be seen or to follow up with PCP.  Patient verbalized understanding and agreeable to plan.   Final Clinical Impressions(s) / UC Diagnoses   Final diagnoses:  Tonsillitis  Pharyngitis, unspecified etiology     Discharge Instructions     Negative rapid strep here today.  Throat lozenges, gargles, chloraseptic spray, warm teas, popsicles etc to help with throat pain.   Will notify you of any positive findings from your throat culture and if any changes to treatment are needed.  You may monitor your results on your MyChart online as well.   Tylenol and/or ibuprofen as needed for pain or fevers.   Due to your symptoms in presence of pandemic I do recommend Covid-19 testing. You will be called to set up an appointment time to be tested at our [facility]. Results take about 2-3 days.  Please self isolate until you receive this results.      ED Prescriptions    None     Controlled Substance Prescriptions [place] Controlled Substance Registry consulted? Not Applicable   Dwayne Gottron, NP 01/23/19 1032

## 2019-01-23 NOTE — Discharge Instructions (Addendum)
Negative rapid strep here today.  Throat lozenges, gargles, chloraseptic spray, warm teas, popsicles etc to help with throat pain.   Will notify you of any positive findings from your throat culture and if any changes to treatment are needed.  You may monitor your results on your MyChart online as well.   Tylenol and/or ibuprofen as needed for pain or fevers.   Due to your symptoms in presence of pandemic I do recommend Covid-19 testing. You will be called to set up an appointment time to be tested at our [facility]. Results take about 2-3 days.  Please self isolate until you receive this results.

## 2019-01-23 NOTE — ED Triage Notes (Signed)
Patient presents to Urgent Care with complaints of left sided throat pain since a few days ago. Patient reports his throat feels "slimy" when he swallows.

## 2019-01-25 LAB — CULTURE, GROUP A STREP (THRC)

## 2019-01-26 LAB — CYTOLOGY, (ORAL, ANAL, URETHRAL) ANCILLARY ONLY
Chlamydia: NEGATIVE
Neisseria Gonorrhea: NEGATIVE

## 2019-01-28 ENCOUNTER — Telehealth (HOSPITAL_COMMUNITY): Payer: Self-pay | Admitting: Emergency Medicine

## 2019-01-28 NOTE — Telephone Encounter (Signed)
Not group A strep, called patient, pt states he is completely better. No treatment indicated.

## 2019-04-30 ENCOUNTER — Emergency Department (HOSPITAL_COMMUNITY)
Admission: EM | Admit: 2019-04-30 | Discharge: 2019-04-30 | Disposition: A | Discharge status: Home / Self Care | Payer: Medicaid Other | Attending: Emergency Medicine | Admitting: Emergency Medicine

## 2019-04-30 ENCOUNTER — Encounter (HOSPITAL_COMMUNITY): Payer: Self-pay | Admitting: Emergency Medicine

## 2019-04-30 DIAGNOSIS — N390 Urinary tract infection, site not specified: Secondary | ICD-10-CM | POA: Insufficient documentation

## 2019-04-30 DIAGNOSIS — Z79899 Other long term (current) drug therapy: Secondary | ICD-10-CM | POA: Diagnosis not present

## 2019-04-30 DIAGNOSIS — Z7722 Contact with and (suspected) exposure to environmental tobacco smoke (acute) (chronic): Secondary | ICD-10-CM | POA: Insufficient documentation

## 2019-04-30 DIAGNOSIS — Z202 Contact with and (suspected) exposure to infections with a predominantly sexual mode of transmission: Secondary | ICD-10-CM

## 2019-04-30 DIAGNOSIS — R300 Dysuria: Secondary | ICD-10-CM | POA: Diagnosis present

## 2019-04-30 LAB — URINALYSIS, ROUTINE W REFLEX MICROSCOPIC
Bilirubin Urine: NEGATIVE
Glucose, UA: NEGATIVE mg/dL
Hgb urine dipstick: NEGATIVE
Ketones, ur: NEGATIVE mg/dL
Nitrite: NEGATIVE
Protein, ur: 100 mg/dL — AB
Specific Gravity, Urine: 1.027 (ref 1.005–1.030)
WBC, UA: >50 WBC/hpf — ABNORMAL HIGH (ref 0–5)
pH: 5 (ref 5.0–8.0)

## 2019-04-30 LAB — HIV ANTIBODY (ROUTINE TESTING W REFLEX): HIV Screen 4th Generation wRfx: NONREACTIVE

## 2019-04-30 MED ORDER — STERILE WATER FOR INJECTION IJ SOLN
INTRAMUSCULAR | Status: AC
  Administered 2019-04-30: 18:00:00 2.5 mL
  Filled 2019-04-30: qty 10

## 2019-04-30 MED ORDER — CEFTRIAXONE SODIUM 250 MG IJ SOLR
250.0000 mg | Freq: Once | INTRAMUSCULAR | Status: AC
  Administered 2019-04-30: 250 mg via INTRAMUSCULAR
  Filled 2019-04-30: qty 250

## 2019-04-30 MED ORDER — DOXYCYCLINE HYCLATE 100 MG PO CAPS: 100.0000 mg | ORAL_CAPSULE | Freq: Two times a day (BID) | ORAL | 0 refills | Status: DC

## 2019-04-30 MED ORDER — AZITHROMYCIN 250 MG PO TABS
1000.0000 mg | ORAL_TABLET | Freq: Once | ORAL | Status: AC
  Administered 2019-04-30: 1000 mg via ORAL
  Filled 2019-04-30: qty 4

## 2019-04-30 NOTE — Discharge Instructions (Signed)
You have been screened for sexually transmitted infection.  Take antibiotic as prescribed.  Notify your partner to get tested as well.

## 2019-04-30 NOTE — ED Notes (Signed)
Unable to gather complete screening questions due to pt on phone to schedule his wisdom teeth being removed.

## 2019-04-30 NOTE — ED Triage Notes (Signed)
Pt reports frequent urination with burning when he pees. Pt concerned for STD.

## 2019-04-30 NOTE — ED Provider Notes (Addendum)
[facility] EMERGENCY DEPARTMENT Provider Note   CSN: [id number] Arrival date & time: 04/30/19  1533     History   Chief Complaint Chief Complaint  Patient presents with  . Polyuria    HPI Dwayne Sandoval is a 23 y.o. male.     The history is provided by the patient and medical records. No language interpreter was used.    23 year old male presenting for evaluation of dysuria.  Patient report for the past 2 days he has noticed stinging sensation when he urinates and now he also noticed some penile discharge.  He admits to having his sexual partner, having sex with same sex not using protection.Marland Kitchen  He does not have any fever, abdominal pain or back pain.  He has not noticed any rash denies any penis pain or scrotal pain.  No rectal discomfort.  He was concerned for potential STI.  He report prior history of gonorrhea.  Past Medical History:  Diagnosis Date  . [name] syndrome [facility])     Patient Active Problem List   Diagnosis Date Noted  . High risk sexual behavior 07/30/2018  . DGI (disseminated gonococcal infection) (HCC)   . 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 No pertinent surgical history.      Home Medications    Prior to Admission medications   Medication Sig Start Date End Date Taking? Authorizing Provider  acetaminophen (TYLENOL) 500 MG tablet Take 2 tablets (1,000 mg total) by mouth every 6 (six) hours as needed for mild pain, moderate pain, fever or headache. 07/12/18   Elpidio Anis, PA-C  emtricitabine-tenofovir AF (DESCOVY) 200-25 MG tablet Take 1 tablet by mouth daily. 07/31/18   Kuppelweiser, Cassie L, RPH-CPP    Family History Family History  Problem Relation Age of Onset  . Diabetes Mother     Social History Social History   Tobacco Use  . Smoking status: Passive Smoke Exposure - Never Smoker  . Smokeless tobacco: Never Used  Substance Use Topics  . Alcohol use: Yes    Comment: occ  . Drug  use: Never     Allergies   Patient has no known allergies.   Review of Systems Review of Systems  Constitutional: Negative for fever.  Genitourinary: Positive for discharge and dysuria. Negative for scrotal swelling and testicular pain.     Physical Exam Updated Vital Signs BP (!) 148/75 (BP Location: Right Arm)   Pulse 99   Temp 98 F (36.7 C) (Oral)   Resp 16   SpO2 100%   Physical Exam Vitals signs and nursing note reviewed.  Constitutional:      General: He is not in acute distress.    Appearance: He is well-developed.  HENT:     Head: Atraumatic.  Eyes:     Conjunctiva/sclera: Conjunctivae normal.  Neck:     Musculoskeletal: Neck supple.  Abdominal:     Palpations: Abdomen is soft.     Tenderness: There is no abdominal tenderness.  Genitourinary:    Comments: Chaperone present during exam.  Inguinal lymphadenopathy noted no inguinal hernia.  Circumcised penis free of lesion or rash however copious amounts of purulent discharge were noted at the penile meatus.  Testicles are nontender with normal lie.  Soft scrotum. Skin:    Findings: No rash.  Neurological:     Mental Status: He is alert.      ED Treatments / Results  Labs (all labs ordered are listed, but only abnormal results are  displayed) Labs Reviewed  URINALYSIS, ROUTINE W REFLEX MICROSCOPIC - Abnormal; Notable for the following components:      Result Value   APPearance CLOUDY (*)    Protein, ur 100 (*)    Leukocytes,Ua LARGE (*)    WBC, UA >50 (*)    Bacteria, UA MANY (*)    All other components within normal limits  RPR  HIV ANTIBODY (ROUTINE TESTING W REFLEX)  HIV4GL SAVE TUBE  GC/CHLAMYDIA PROBE AMP ([place]) NOT AT [facility]    EKG None  Radiology No results found.  Procedures Procedures (including critical care time)  Medications Ordered in ED Medications  cefTRIAXone (ROCEPHIN) injection 250 mg (250 mg Intramuscular Given 04/30/19 1755)  azithromycin (ZITHROMAX) tablet  1,000 mg (1,000 mg Oral Given 04/30/19 1755)  sterile water (preservative free) injection (2.5 mLs  Given 04/30/19 1756)     Initial Impression / Assessment and Plan / ED Course  I have reviewed the triage vital signs and the nursing notes.  Pertinent labs & imaging results that were available during my care of the patient were reviewed by me and considered in my medical decision making (see chart for details).        BP (!) 148/75 (BP Location: Right Arm)   Pulse 99   Temp 98 F (36.7 C) (Oral)   Resp 16   SpO2 100%    Final Clinical Impressions(s) / ED Diagnoses   Final diagnoses:  Lower urinary tract infectious disease  Possible exposure to STD    ED Discharge Orders         Ordered    doxycycline (VIBRAMYCIN) 100 MG capsule  2 times daily     04/30/19 1819         5:36 PM Patient here with dysuria and penile discharge.  Has high risk sexual behaviors.  STI screening performed.  UA does show signs of infection.  Patient given Rocephin and Zithromax here and will discharge home with doxycycline.  Appropriate discharge instruction given.   Domenic Moras, PA-C 04/30/19 1821    Sherwood Gambler, MD 04/30/19 2141    ADDENDUM: PE added.    Domenic Moras, PA-C 05/07/19 [place], MD 05/09/19 [phone]

## 2019-05-01 LAB — RPR: RPR Ser Ql: NONREACTIVE

## 2019-05-04 LAB — GC/CHLAMYDIA PROBE AMP (~~LOC~~) NOT AT ARMC
Chlamydia: NEGATIVE
Neisseria Gonorrhea: POSITIVE — AB

## 2019-06-20 ENCOUNTER — Emergency Department (HOSPITAL_COMMUNITY)
Admission: EM | Admit: 2019-06-20 | Discharge: 2019-06-20 | Disposition: A | Discharge status: Home / Self Care | Payer: Medicaid Other | Source: Home / Self Care | Attending: Emergency Medicine | Admitting: Emergency Medicine

## 2019-06-20 ENCOUNTER — Encounter (HOSPITAL_COMMUNITY): Payer: Self-pay | Admitting: Emergency Medicine

## 2019-06-20 ENCOUNTER — Emergency Department (HOSPITAL_COMMUNITY)
Admission: EM | Admit: 2019-06-20 | Discharge: 2019-06-20 | Disposition: A | Discharge status: Home / Self Care | Payer: Medicaid Other | Attending: Emergency Medicine | Admitting: Emergency Medicine

## 2019-06-20 ENCOUNTER — Other Ambulatory Visit: Payer: Self-pay

## 2019-06-20 DIAGNOSIS — Z5321 Procedure and treatment not carried out due to patient leaving prior to being seen by health care provider: Secondary | ICD-10-CM | POA: Diagnosis not present

## 2019-06-20 DIAGNOSIS — Z202 Contact with and (suspected) exposure to infections with a predominantly sexual mode of transmission: Secondary | ICD-10-CM | POA: Diagnosis not present

## 2019-06-20 DIAGNOSIS — R070 Pain in throat: Secondary | ICD-10-CM | POA: Insufficient documentation

## 2019-06-20 DIAGNOSIS — Z711 Person with feared health complaint in whom no diagnosis is made: Secondary | ICD-10-CM

## 2019-06-20 DIAGNOSIS — J029 Acute pharyngitis, unspecified: Secondary | ICD-10-CM | POA: Diagnosis not present

## 2019-06-20 DIAGNOSIS — Z7722 Contact with and (suspected) exposure to environmental tobacco smoke (acute) (chronic): Secondary | ICD-10-CM | POA: Insufficient documentation

## 2019-06-20 DIAGNOSIS — Z113 Encounter for screening for infections with a predominantly sexual mode of transmission: Secondary | ICD-10-CM | POA: Insufficient documentation

## 2019-06-20 DIAGNOSIS — Z79899 Other long term (current) drug therapy: Secondary | ICD-10-CM | POA: Insufficient documentation

## 2019-06-20 LAB — BASIC METABOLIC PANEL
Anion gap: 10 (ref 5–15)
BUN: 10 mg/dL (ref 6–20)
CO2: 27 mmol/L (ref 22–32)
Calcium: 9.6 mg/dL (ref 8.9–10.3)
Chloride: 104 mmol/L (ref 98–111)
Creatinine, Ser: 1.03 mg/dL (ref 0.61–1.24)
GFR calc Af Amer: >60 mL/min (ref >60)
GFR calc non Af Amer: >60 mL/min (ref >60)
Glucose, Bld: 114 mg/dL — ABNORMAL HIGH (ref 70–99)
Potassium: 4.5 mmol/L (ref 3.5–5.1)
Sodium: 141 mmol/L (ref 135–145)

## 2019-06-20 LAB — URINALYSIS, ROUTINE W REFLEX MICROSCOPIC
Bacteria, UA: NONE SEEN
Bilirubin Urine: NEGATIVE
Glucose, UA: NEGATIVE mg/dL
Hgb urine dipstick: NEGATIVE
Ketones, ur: NEGATIVE mg/dL
Leukocytes,Ua: NEGATIVE
Nitrite: NEGATIVE
Protein, ur: 30 mg/dL — AB
Specific Gravity, Urine: 1.023 (ref 1.005–1.030)
pH: 6 (ref 5.0–8.0)

## 2019-06-20 LAB — CBC WITH DIFFERENTIAL/PLATELET
Abs Immature Granulocytes: 0.01 10*3/uL (ref 0.00–0.07)
Basophils Absolute: 0.1 10*3/uL (ref 0.0–0.1)
Basophils Relative: 1 %
Eosinophils Absolute: 0.2 10*3/uL (ref 0.0–0.5)
Eosinophils Relative: 3 %
HCT: 45.6 % (ref 39.0–52.0)
Hemoglobin: 15.6 g/dL (ref 13.0–17.0)
Immature Granulocytes: 0 %
Lymphocytes Relative: 47 %
Lymphs Abs: 3.2 10*3/uL (ref 0.7–4.0)
MCH: 30.5 pg (ref 26.0–34.0)
MCHC: 34.2 g/dL (ref 30.0–36.0)
MCV: 89.1 fL (ref 80.0–100.0)
Monocytes Absolute: 0.4 10*3/uL (ref 0.1–1.0)
Monocytes Relative: 7 %
Neutro Abs: 2.8 10*3/uL (ref 1.7–7.7)
Neutrophils Relative %: 42 %
Platelets: 336 10*3/uL (ref 150–400)
RBC: 5.12 MIL/uL (ref 4.22–5.81)
RDW: 12.4 % (ref 11.5–15.5)
WBC: 6.6 10*3/uL (ref 4.0–10.5)
nRBC: 0 % (ref 0.0–0.2)

## 2019-06-20 LAB — HIV ANTIBODY (ROUTINE TESTING W REFLEX): HIV Screen 4th Generation wRfx: NONREACTIVE

## 2019-06-20 MED ORDER — CEFTRIAXONE SODIUM 250 MG IJ SOLR
250.0000 mg | Freq: Once | INTRAMUSCULAR | Status: AC
  Administered 2019-06-20: 19:00:00 250 mg via INTRAMUSCULAR
  Filled 2019-06-20: qty 250

## 2019-06-20 MED ORDER — STERILE WATER FOR INJECTION IJ SOLN
INTRAMUSCULAR | Status: AC
  Administered 2019-06-20: 10 mL
  Filled 2019-06-20: qty 10

## 2019-06-20 MED ORDER — AZITHROMYCIN 250 MG PO TABS
1000.0000 mg | ORAL_TABLET | Freq: Once | ORAL | Status: AC
  Administered 2019-06-20: 1000 mg via ORAL
  Filled 2019-06-20: qty 4

## 2019-06-20 NOTE — ED Triage Notes (Signed)
Patient requesting STD screening , reports sexual partner has penile discharge , patient denies any symptoms , except for mild throat discomfort , denies fever or chills .

## 2019-06-20 NOTE — Discharge Instructions (Signed)
Follow up with [facility] Department STD clinic to be screened for HIV in the future and for future STD concerns or screenings. This is the recommendation by the CDC for people with multiple sexual partners or hx of STDs. You have been treated for gonorrhea and chlamydia in the ER but the hospital will call you if lab is positive.  Do not have sexual intercourse for 7 days after antibiotic treatment.  You can view your results on MyChart as well but if you do not receive a phone call then your test results were negative.

## 2019-06-20 NOTE — ED Provider Notes (Signed)
[place] EMERGENCY DEPARTMENT Provider Note   CSN: [id number] Arrival date & time: 06/20/19  1721     History   Chief Complaint Chief Complaint  Patient presents with  . SEXUALLY TRANSMITTED DISEASE    HPI Dwayne Sandoval is a 23 y.o. 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  He reports that 3 days ago he received report that a recent sexual partner was experiencing penile drainage.  He reports that he has participated in both oral and anal sex with this individual without protection.  He states that since he received this news he has felt a very mild sore throat when swallowing large amounts of saliva but otherwise denies difficulty swallowing, fevers, or significant pain.  Denies nausea vomiting, urinary symptoms, pain with bowel movements, testicular pain, scrotal swelling, or genital lesions.     The history is provided by the patient.    Past Medical History:  Diagnosis Date  . Idamae Schuller syndrome [facility])     Patient Active Problem List   Diagnosis Date Noted  . High risk sexual behavior 07/30/2018  . DGI (disseminated gonococcal infection) ([place])   . 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 No pertinent surgical history.      Home Medications    Prior to Admission medications   Medication Sig Start Date End Date Taking? Authorizing Provider  acetaminophen (TYLENOL) 500 MG tablet Take 2 tablets (1,000 mg total) by mouth every 6 (six) hours as needed for mild pain, moderate pain, fever or headache. 07/12/18   Charlann Lange, PA-C  doxycycline (VIBRAMYCIN) 100 MG capsule Take 1 capsule (100 mg total) by mouth 2 (two) times daily. One po bid x 7 days 04/30/19   Domenic Moras, PA-C  emtricitabine-tenofovir AF (DESCOVY) 200-25 MG tablet Take 1 tablet by mouth daily. 07/31/18   Kuppelweiser, Cassie L, RPH-CPP    Family  History Family History  Problem Relation Age of Onset  . Diabetes Mother     Social History Social History   Tobacco Use  . Smoking status: Passive Smoke Exposure - Never Smoker  . Smokeless tobacco: Never Used  Substance Use Topics  . Alcohol use: Yes    Comment: occ  . Drug use: Never     Allergies   Patient has no known allergies.   Review of Systems Review of Systems  Constitutional: Negative for fever.  HENT: Positive for sore throat. Negative for drooling, mouth sores and trouble swallowing.   Gastrointestinal: Negative for nausea, rectal pain and vomiting.  Genitourinary: Negative for dysuria, frequency, genital sores, penile pain, penile swelling, scrotal swelling and testicular pain.  All other systems reviewed and are negative.    Physical Exam Updated Vital Signs BP 120/67 (BP Location: Left Arm)   Pulse 67   Temp 98.2 F (36.8 C) (Oral)   Resp 16   SpO2 100%   Physical Exam Vitals signs and nursing note reviewed.  Constitutional:      General: He is not in acute distress.    Appearance: He is well-developed.  HENT:     Head: Normocephalic and atraumatic.     Mouth/Throat:     Comments: Tolerating secretions without difficulty.  No tonsillar hypertrophy, erythema, exudates, trismus, sublingual abnormalities, or uvular deviation.  No abnormal phonation Eyes:     General:        Right eye: No discharge.  Left eye: No discharge.     Conjunctiva/sclera: Conjunctivae normal.  Neck:     Musculoskeletal: Normal range of motion and neck supple.     Vascular: No JVD.     Trachea: No tracheal deviation.  Cardiovascular:     Rate and Rhythm: Normal rate.  Pulmonary:     Effort: Pulmonary effort is normal.     Breath sounds: Normal breath sounds.  Abdominal:     General: Abdomen is flat. Bowel sounds are normal. There is no distension.     Palpations: Abdomen is soft.     Tenderness: There is no abdominal tenderness. There is no guarding or  rebound.  Skin:    General: Skin is warm and dry.     Findings: No erythema.  Neurological:     Mental Status: He is alert.  Psychiatric:        Behavior: Behavior normal.      ED Treatments / Results  Labs (all labs ordered are listed, but only abnormal results are displayed) Labs Reviewed  RPR  HIV ANTIBODY (ROUTINE TESTING W REFLEX)  GC/CHLAMYDIA PROBE AMP ([place]) NOT AT [facility]    EKG None  Radiology No results found.  Procedures Procedures (including critical care time)  Medications Ordered in ED Medications  cefTRIAXone (ROCEPHIN) injection 250 mg (has no administration in time range)  azithromycin (ZITHROMAX) tablet 1,000 mg (has no administration in time range)     Initial Impression / Assessment and Plan / ED Course  I have reviewed the triage vital signs and the nursing notes.  Pertinent labs & imaging results that were available during my care of the patient were reviewed by me and considered in my medical decision making (see chart for details).        Patient presents complaining of very mild sore throat after learning that one of his most recent sexual partners has penile drainage.  He is afebrile, vital signs are stable.  He is nontoxic in appearance.  No signs of strep pharyngitis, no tonsillar exudates, no evidence of peritonsillar abscess.  He is tolerating secretions without difficulty.  Will obtain GC chlamydia cultures from throat swab.  We will also obtain HIV and syphilis cultures.  He actually presented to the ED around midnight this morning but was unable to stay for evaluation due to long wait times; he did provide a UA which was reassuring.  No complaint of testicular pain or scrotal swelling, doubt epididymitis, orchitis, prostatitis, or prostatitis.  Abdomen soft and nontender.  Discussed safe sex practices and recommend follow-up with health department for any future STD testing or treatment.  Recommend that he inform partners of any  positive test results.  Discussed strict ED return precautions. Patient verbalized understanding of and agreement with plan and is safe for discharge home at this time.   Final Clinical Impressions(s) / ED Diagnoses   Final diagnoses:  Concern about STD in male without diagnosis  Sore throat    ED Discharge Orders    None       Bennye Alm 06/20/19 1830    Little, Ambrose Finland, MD 06/21/19 1102

## 2019-06-20 NOTE — ED Notes (Signed)
Pt stated that he was seeking care elsewhere. Armband removed.

## 2019-06-20 NOTE — ED Triage Notes (Signed)
Pt requests STD testing.  C/o sore throat when swallowing "a lot" of saliva.  Reports sexual partner reported penile discharge and that he last had intercourse on Thanksgiving.  States he had a headache on Thanksgiving day after taking "a pink pill that makes intercourse better" but denies headache since then.

## 2019-06-21 LAB — RPR: RPR Ser Ql: NONREACTIVE

## 2019-06-22 LAB — GC/CHLAMYDIA PROBE AMP (~~LOC~~) NOT AT ARMC
Chlamydia: NEGATIVE
Neisseria Gonorrhea: NEGATIVE

## 2019-08-09 ENCOUNTER — Emergency Department (HOSPITAL_COMMUNITY): Payer: Medicaid Other

## 2019-08-09 ENCOUNTER — Emergency Department (HOSPITAL_COMMUNITY)
Admission: EM | Admit: 2019-08-09 | Discharge: 2019-08-09 | Disposition: A | Discharge status: Home / Self Care | Payer: Medicaid Other | Attending: Emergency Medicine | Admitting: Emergency Medicine

## 2019-08-09 ENCOUNTER — Other Ambulatory Visit: Payer: Self-pay

## 2019-08-09 ENCOUNTER — Encounter (HOSPITAL_COMMUNITY): Payer: Self-pay | Admitting: Emergency Medicine

## 2019-08-09 DIAGNOSIS — Z7722 Contact with and (suspected) exposure to environmental tobacco smoke (acute) (chronic): Secondary | ICD-10-CM | POA: Diagnosis not present

## 2019-08-09 DIAGNOSIS — M545 Low back pain, unspecified: Secondary | ICD-10-CM

## 2019-08-09 DIAGNOSIS — S99922A Unspecified injury of left foot, initial encounter: Secondary | ICD-10-CM | POA: Diagnosis not present

## 2019-08-09 DIAGNOSIS — S90812A Abrasion, left foot, initial encounter: Secondary | ICD-10-CM | POA: Diagnosis not present

## 2019-08-09 DIAGNOSIS — R600 Localized edema: Secondary | ICD-10-CM | POA: Diagnosis not present

## 2019-08-09 DIAGNOSIS — Y9389 Activity, other specified: Secondary | ICD-10-CM | POA: Diagnosis not present

## 2019-08-09 DIAGNOSIS — Y999 Unspecified external cause status: Secondary | ICD-10-CM | POA: Diagnosis not present

## 2019-08-09 DIAGNOSIS — Y92410 Unspecified street and highway as the place of occurrence of the external cause: Secondary | ICD-10-CM | POA: Diagnosis not present

## 2019-08-09 DIAGNOSIS — M542 Cervicalgia: Secondary | ICD-10-CM | POA: Diagnosis not present

## 2019-08-09 DIAGNOSIS — R1110 Vomiting, unspecified: Secondary | ICD-10-CM | POA: Insufficient documentation

## 2019-08-09 DIAGNOSIS — R109 Unspecified abdominal pain: Secondary | ICD-10-CM | POA: Diagnosis not present

## 2019-08-09 DIAGNOSIS — R519 Headache, unspecified: Secondary | ICD-10-CM | POA: Insufficient documentation

## 2019-08-09 DIAGNOSIS — M79672 Pain in left foot: Secondary | ICD-10-CM

## 2019-08-09 MED ORDER — MELOXICAM 15 MG PO TABS: 15.0000 mg | ORAL_TABLET | Freq: Every day | ORAL | 0 refills | Status: DC

## 2019-08-09 MED ORDER — HYDROCODONE-ACETAMINOPHEN 5-325 MG PO TABS
1.0000 | ORAL_TABLET | Freq: Once | ORAL | Status: AC
  Administered 2019-08-09: 1 via ORAL
  Filled 2019-08-09: qty 1

## 2019-08-09 MED ORDER — LIDOCAINE 5 % EX PTCH
2.0000 | MEDICATED_PATCH | CUTANEOUS | Status: DC
  Administered 2019-08-09: 2 via TRANSDERMAL
  Filled 2019-08-09: qty 2

## 2019-08-09 MED ORDER — METHOCARBAMOL 500 MG PO TABS: 500.0000 mg | ORAL_TABLET | Freq: Two times a day (BID) | ORAL | 0 refills | Status: DC
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
## 2019-08-09 NOTE — ED Notes (Signed)
Patient verbalizes understanding of discharge instructions . Opportunity for questions and answers were provided . Armband removed by staff ,Pt discharged from ED. W/C  offered at D/C  and Declined W/C at D/C and was escorted to lobby by RN.  

## 2019-08-09 NOTE — Discharge Instructions (Addendum)
The pain you are experiencing is likely due to muscle strain. Your x-rays were normal. I am sending you home with meloxicam which is a pain medication. You can take it once a day as needed for pain. Do not mix with other over the counter pain medications. I am also sending you home with a muscle relaxer. Medication can cause drowsiness, so do not drive or operate machinery while on the medication. You may also use ice and heat, and over-the-counter remedies such as Biofreeze gel or salon pas lidocaine patches. You may also purchase Voltaren gel. The muscle soreness should improve over the next week. Expect muscle pain to be worse on days 2 and 3. Follow up with your family doctor in the next week for a recheck if you are still having symptoms. I have included the number of [facility]. Return to ED if pain is worsening, you develop weakness or numbness of extremities, or new or concerning symptoms develop.

## 2019-08-19 ENCOUNTER — Emergency Department (HOSPITAL_COMMUNITY)
Admission: EM | Admit: 2019-08-19 | Discharge: 2019-08-19 | Disposition: A | Discharge status: Home / Self Care | Payer: Medicaid Other | Attending: Emergency Medicine | Admitting: Emergency Medicine

## 2019-08-19 ENCOUNTER — Other Ambulatory Visit: Payer: Self-pay

## 2019-08-19 DIAGNOSIS — M545 Low back pain, unspecified: Secondary | ICD-10-CM

## 2019-08-19 DIAGNOSIS — Z7722 Contact with and (suspected) exposure to environmental tobacco smoke (acute) (chronic): Secondary | ICD-10-CM | POA: Diagnosis not present

## 2019-08-19 DIAGNOSIS — G8921 Chronic pain due to trauma: Secondary | ICD-10-CM | POA: Diagnosis not present

## 2019-08-19 DIAGNOSIS — R10815 Periumbilic abdominal tenderness: Secondary | ICD-10-CM | POA: Diagnosis not present

## 2019-08-19 MED ORDER — ACETAMINOPHEN 500 MG PO TABS
1000.0000 mg | ORAL_TABLET | Freq: Once | ORAL | Status: AC
  Administered 2019-08-19: 1000 mg via ORAL
  Filled 2019-08-19: qty 2

## 2019-08-19 MED ORDER — LIDOCAINE 5 % EX PTCH
1.0000 | MEDICATED_PATCH | CUTANEOUS | Status: DC
  Administered 2019-08-19: 1 via TRANSDERMAL
  Filled 2019-08-19: qty 1

## 2019-08-19 MED ORDER — NAPROXEN 250 MG PO TABS
500.0000 mg | ORAL_TABLET | Freq: Once | ORAL | Status: AC
  Administered 2019-08-19: 15:00:00 500 mg via ORAL
  Filled 2019-08-19: qty 2
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
## 2019-08-19 NOTE — ED Notes (Signed)
Patient verbalizes understanding of discharge instructions. Opportunity for questioning and answers were provided. Armband removed by staff, pt discharged from ED ambulatory to home.  
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
## 2019-08-24 DIAGNOSIS — Z3009 Encounter for other general counseling and advice on contraception: Secondary | ICD-10-CM | POA: Diagnosis not present

## 2019-08-24 DIAGNOSIS — Z0389 Encounter for observation for other suspected diseases and conditions ruled out: Secondary | ICD-10-CM | POA: Diagnosis not present

## 2019-08-24 DIAGNOSIS — Z1388 Encounter for screening for disorder due to exposure to contaminants: Secondary | ICD-10-CM | POA: Diagnosis not present

## 2019-08-28 ENCOUNTER — Ambulatory Visit (HOSPITAL_COMMUNITY)
Admission: EM | Admit: 2019-08-28 | Discharge: 2019-08-28 | Disposition: A | Discharge status: Home / Self Care | Payer: Medicaid Other | Attending: Urgent Care | Admitting: Urgent Care

## 2019-08-28 ENCOUNTER — Other Ambulatory Visit: Payer: Self-pay

## 2019-08-28 ENCOUNTER — Encounter (HOSPITAL_COMMUNITY): Payer: Self-pay

## 2019-08-28 DIAGNOSIS — R070 Pain in throat: Secondary | ICD-10-CM

## 2019-08-28 DIAGNOSIS — A549 Gonococcal infection, unspecified: Secondary | ICD-10-CM

## 2019-08-28 MED ORDER — CEFTRIAXONE SODIUM 500 MG IJ SOLR
500.0000 mg | Freq: Once | INTRAMUSCULAR | Status: AC
  Administered 2019-08-28: 13:00:00 500 mg via INTRAMUSCULAR

## 2019-08-28 MED ORDER — CEFTRIAXONE SODIUM 500 MG IJ SOLR
INTRAMUSCULAR | Status: AC
  Filled 2019-08-28: qty 500

## 2019-08-28 MED ORDER — LIDOCAINE HCL (PF) 1 % IJ SOLN
INTRAMUSCULAR | Status: AC
  Filled 2019-08-28: qty 2

## 2019-08-28 NOTE — ED Provider Notes (Signed)
  [facility]   MRN: [id number] DOB: [birth year]  Subjective:   Dwayne Sandoval is a 24 y.o. male presenting for treatment for gonorrhea.  Patient states that he had testing completed at a different clinic and was advised that his throat swab came positive for gonorrhea.  He also got tested for HIV and syphilis, chlamydia and trichomonas and states that all was negative.  Currently, he has mild throat discomfort.  He is not taking any medications.  No Known Allergies  Past Medical History:  Diagnosis Date  . [name] syndrome [facility])      History reviewed. No pertinent surgical history.  Family History  Problem Relation Age of Onset  . Diabetes Mother   . Healthy Father     Social History   Tobacco Use  . Smoking status: Passive Smoke Exposure - Never Smoker  . Smokeless tobacco: Never Used  Substance Use Topics  . Alcohol use: Yes    Comment: occ  . Drug use: Never    ROS   Objective:   Vitals: BP 121/68   Pulse (!) 113   Temp 98 F (36.7 C)   Resp 18   SpO2 95%   Physical Exam Constitutional:      General: He is not in acute distress.    Appearance: Normal appearance. He is well-developed and normal weight. He is not ill-appearing, toxic-appearing or diaphoretic.  HENT:     Head: Normocephalic and atraumatic.     Right Ear: External ear normal.     Left Ear: External ear normal.     Nose: Nose normal.     Mouth/Throat:     Comments: Mild erythema about the soft palate posteriorly and tonsillar area bilaterally. Eyes:     General: No scleral icterus.       Right eye: No discharge.        Left eye: No discharge.     Extraocular Movements: Extraocular movements intact.     Pupils: Pupils are equal, round, and reactive to light.  Cardiovascular:     Rate and Rhythm: Normal rate.  Pulmonary:     Effort: Pulmonary effort is normal.  Musculoskeletal:     Cervical back: Normal range of motion.  Neurological:     Mental Status: He is alert  and oriented to person, place, and time.  Psychiatric:        Mood and Affect: Mood normal.        Behavior: Behavior normal.        Thought Content: Thought content normal.        Judgment: Judgment normal.      Assessment and Plan :   1. Gonorrhea in male   2. Throat discomfort     Treatment as per CDC guidelines for gonorrhea given to patient including 500 mg ceftriaxone IM. Counseled on safe sex practices including abstaining for 1 week following treatment.  Counseled patient on potential for adverse effects with medications prescribed/recommended today, ER and return-to-clinic precautions discussed, patient verbalized understanding.    Wallis Bamberg, [place] 08/28/19 1237

## 2019-08-28 NOTE — ED Triage Notes (Signed)
Pt presents with complaints of sore throat x 1 week ago. Pt was tested for COVID and it was negative. The patient went and got tested for STD's at another clinic and was told the throat swab came back positive for Gonorrhea. The patient is requesting treatment.

## 2019-10-15 ENCOUNTER — Other Ambulatory Visit: Payer: Self-pay

## 2019-10-15 ENCOUNTER — Encounter (HOSPITAL_COMMUNITY): Payer: Self-pay

## 2019-10-15 ENCOUNTER — Ambulatory Visit (HOSPITAL_COMMUNITY)
Admission: EM | Admit: 2019-10-15 | Discharge: 2019-10-15 | Disposition: A | Discharge status: Home / Self Care | Payer: Medicaid Other | Attending: Urgent Care | Admitting: Urgent Care

## 2019-10-15 DIAGNOSIS — R067 Sneezing: Secondary | ICD-10-CM | POA: Diagnosis not present

## 2019-10-15 DIAGNOSIS — U071 COVID-19: Secondary | ICD-10-CM | POA: Diagnosis not present

## 2019-10-15 DIAGNOSIS — J029 Acute pharyngitis, unspecified: Secondary | ICD-10-CM | POA: Diagnosis not present

## 2019-10-15 DIAGNOSIS — Z8619 Personal history of other infectious and parasitic diseases: Secondary | ICD-10-CM

## 2019-10-15 DIAGNOSIS — Z20822 Contact with and (suspected) exposure to covid-19: Secondary | ICD-10-CM

## 2019-10-15 DIAGNOSIS — Z7251 High risk heterosexual behavior: Secondary | ICD-10-CM | POA: Insufficient documentation

## 2019-10-15 DIAGNOSIS — R0981 Nasal congestion: Secondary | ICD-10-CM | POA: Diagnosis not present

## 2019-10-15 LAB — POCT RAPID STREP A: Streptococcus, Group A Screen (Direct): NEGATIVE

## 2019-10-15 MED ORDER — DOXYCYCLINE HYCLATE 100 MG PO CAPS: 100.0000 mg | ORAL_CAPSULE | Freq: Two times a day (BID) | ORAL | 0 refills | Status: DC

## 2019-10-15 MED ORDER — CEFTRIAXONE SODIUM 500 MG IJ SOLR
500.0000 mg | Freq: Once | INTRAMUSCULAR | Status: AC
  Administered 2019-10-15: 500 mg via INTRAMUSCULAR

## 2019-10-15 MED ORDER — CEFTRIAXONE SODIUM 500 MG IJ SOLR
INTRAMUSCULAR | Status: AC
  Filled 2019-10-15: qty 500

## 2019-10-15 NOTE — ED Triage Notes (Signed)
Pt present sore throat with a possible of std from giving oral sex. Pt states that when he cough today he his mucous was had blood in or when he blow his nose.

## 2019-10-15 NOTE — Discharge Instructions (Addendum)
Today you are receiving an injection of ceftriaxone, an antibiotic that can take care of gonorrhea.  We are also prescribing you doxycycline to take as an outpatient which is an antibiotic that can help with chlamydia.  Please make sure you take this time to antibiotic completely.  I highly recommend you hold off on having sex until 1 week after you finish treatment; that way you give your body a chance to clear this infection.  Look out for your results on MyChart.  We will notify you of your COVID-19 test results as they arrive and may take between 24 to 48 hours.  I encourage you to sign up for MyChart if you have not already done so as this can be the easiest way for [place] to communicate results to you online or through a phone app.  In the meantime, if you develop worsening symptoms including fever, chest pain, shortness of breath despite our current treatment plan then please report to the emergency room as this may be a sign of worsening status from possible COVID-19 infection.  Otherwise, we will manage this as a viral syndrome. For sore throat or cough try using a honey-based tea. Use 3 teaspoons of honey with juice squeezed from half lemon. Place shaved pieces of ginger into 1/2-1 cup of water and warm over stove top. Then mix the ingredients and repeat every 4 hours as needed. Please take Tylenol 500mg -650mg  every 6 hours. Hydrate very well with at least 2 liters of water. Eat light meals such as soups to replenish electrolytes and soft fruits, veggies. Start an antihistamine like Zyrtec, Allegra or Claritin for postnasal drainage, sinus congestion.  You can take this together with pseudoephedrine (Sudafed) at a dose of 60 mg 2-3 times a day as needed for the same kind of congestion.

## 2019-10-15 NOTE — ED Provider Notes (Signed)
[facility]   MRN: [id number] DOB: Nov 13, 1995  Subjective:   Dwayne Sandoval is a 24 y.o. male presenting for several day history of acute onset of throat pain, stuffy nose, sneezing.  Patient states that symptoms started from having sex with a new partner. Would like to be checked for STIs. Has had multiple sex partners in the past 3-6 months. Tested positive for gonorrhea in February 2021. He also had STI testing including HIV tests in October, December 2020 and again in February 2021; all were negative.   No current facility-administered medications for this encounter. No current outpatient medications on file.   No Known Allergies  Past Medical History:  Diagnosis Date  . [name] syndrome [facility])      History reviewed. No pertinent surgical history.  Family History  Problem Relation Age of Onset  . Diabetes Mother   . Healthy Father     Social History   Tobacco Use  . Smoking status: Passive Smoke Exposure - Never Smoker  . Smokeless tobacco: Never Used  Substance Use Topics  . Alcohol use: Yes    Comment: occ  . Drug use: Never    ROS   Objective:   Vitals: BP 124/83 (BP Location: Right Arm)   Pulse 86   Temp 100.2 F (37.9 C) (Oral)   Resp 18   SpO2 100%   Physical Exam Constitutional:      General: He is not in acute distress.    Appearance: Normal appearance. He is well-developed and normal weight. He is not ill-appearing, toxic-appearing or diaphoretic.  HENT:     Head: Normocephalic and atraumatic.     Right Ear: Tympanic membrane, ear canal and external ear normal. There is no impacted cerumen.     Left Ear: Tympanic membrane, ear canal and external ear normal. There is no impacted cerumen.     Nose: Nose normal. No congestion or rhinorrhea.     Mouth/Throat:     Mouth: Mucous membranes are moist. No oral lesions.     Pharynx: Oropharynx is clear. Posterior oropharyngeal erythema present. No pharyngeal swelling, oropharyngeal  exudate or uvula swelling.  Eyes:     General: No scleral icterus.       Right eye: No discharge.        Left eye: No discharge.     Extraocular Movements: Extraocular movements intact.     Conjunctiva/sclera: Conjunctivae normal.     Pupils: Pupils are equal, round, and reactive to light.  Cardiovascular:     Rate and Rhythm: Normal rate and regular rhythm.     Heart sounds: Normal heart sounds. No murmur. No friction rub. No gallop.   Pulmonary:     Effort: Pulmonary effort is normal. No respiratory distress.     Breath sounds: Normal breath sounds. No stridor. No wheezing, rhonchi or rales.  Musculoskeletal:     Cervical back: Normal range of motion and neck supple. No rigidity. No muscular tenderness.  Neurological:     General: No focal deficit present.     Mental Status: He is alert and oriented to person, place, and time.  Psychiatric:        Mood and Affect: Mood normal.        Behavior: Behavior normal.        Thought Content: Thought content normal.     Results for orders placed or performed during the hospital encounter of 10/15/19 (from the past 24 hour(s))  POCT rapid strep A [facility])  Status: None   Collection Time: 10/15/19 11:55 AM  Result Value Ref Range   Streptococcus, Group A Screen (Direct) NEGATIVE NEGATIVE    Assessment and Plan :   1. Sore throat   2. Sinus congestion   3. Sneezing   4. High risk sexual behavior, unspecified type   5. Exposure to COVID-19 virus   6. History of gonorrhea     Patient is very high risk for STIs, had extensive conversation with patient about safe sex. Will hold off on HIV and RPR tests today.  COVID 19 testing is pending. Recommended supportive care for possible concurrent viral illness including COVID 19, viral URI. Counseled patient on potential for adverse effects with medications prescribed/recommended today, ER and return-to-clinic precautions discussed, patient verbalized understanding.    Jaynee Eagles, PA-C 10/15/19 1308

## 2019-10-16 ENCOUNTER — Telehealth (HOSPITAL_COMMUNITY): Payer: Self-pay | Admitting: *Deleted

## 2019-10-16 LAB — CYTOLOGY, (ORAL, ANAL, URETHRAL) ANCILLARY ONLY
Chlamydia: NEGATIVE
Chlamydia: NEGATIVE
Comment: NEGATIVE
Comment: NEGATIVE
Comment: NEGATIVE
Comment: NEGATIVE
Comment: NORMAL
Comment: NORMAL
Neisseria Gonorrhea: NEGATIVE
Neisseria Gonorrhea: POSITIVE — AB
Trichomonas: NEGATIVE
Trichomonas: NEGATIVE

## 2019-10-16 LAB — SARS CORONAVIRUS 2 (TAT 6-24 HRS): SARS Coronavirus 2: POSITIVE — AB

## 2019-10-16 NOTE — Telephone Encounter (Signed)
Test for gonorrhea was positive. This was treated at the urgent care visit with IM rocephin 500mg . Please refrain from sexual intercourse for 7 days after treatment to give the medicine time to work. Sexual partners need to be notified and tested/treated. Condoms may reduce risk of reinfection. Recheck or followup with PCP for further evaluation if symptoms are not improving.  Patient will quarantine April 1-April 10. Will place work note in chart. All questions answered.

## 2019-10-17 LAB — CULTURE, GROUP A STREP (THRC)

## 2019-10-20 ENCOUNTER — Emergency Department (HOSPITAL_COMMUNITY)
Admission: EM | Admit: 2019-10-20 | Discharge: 2019-10-20 | Disposition: A | Discharge status: Home / Self Care | Payer: Medicaid Other | Attending: Emergency Medicine | Admitting: Emergency Medicine

## 2019-10-20 ENCOUNTER — Encounter (HOSPITAL_COMMUNITY): Payer: Self-pay

## 2019-10-20 ENCOUNTER — Other Ambulatory Visit: Payer: Self-pay

## 2019-10-20 DIAGNOSIS — R109 Unspecified abdominal pain: Secondary | ICD-10-CM | POA: Insufficient documentation

## 2019-10-20 DIAGNOSIS — Z5321 Procedure and treatment not carried out due to patient leaving prior to being seen by health care provider: Secondary | ICD-10-CM | POA: Diagnosis not present

## 2019-10-20 DIAGNOSIS — U071 COVID-19: Secondary | ICD-10-CM | POA: Insufficient documentation

## 2019-10-20 NOTE — ED Triage Notes (Signed)
Reports that he tested positive for COVID on 4/2. He states that he has been staying in a hotel room, but doesn't want to stay there by himself in case something happens to him. He also reports some abdominal discomfort, but is tolerating PO.

## 2019-10-20 NOTE — ED Notes (Signed)
Pt stated that he just wanted to go home and sleep.

## 2019-11-15 ENCOUNTER — Emergency Department (HOSPITAL_COMMUNITY)
Admission: EM | Admit: 2019-11-15 | Discharge: 2019-11-15 | Disposition: A | Discharge status: Home / Self Care | Payer: Medicaid Other | Attending: Emergency Medicine | Admitting: Emergency Medicine

## 2019-11-15 ENCOUNTER — Encounter (HOSPITAL_COMMUNITY): Payer: Self-pay | Admitting: Emergency Medicine

## 2019-11-15 ENCOUNTER — Other Ambulatory Visit: Payer: Self-pay

## 2019-11-15 DIAGNOSIS — J029 Acute pharyngitis, unspecified: Secondary | ICD-10-CM | POA: Insufficient documentation

## 2019-11-15 DIAGNOSIS — Z7722 Contact with and (suspected) exposure to environmental tobacco smoke (acute) (chronic): Secondary | ICD-10-CM | POA: Insufficient documentation

## 2019-11-15 DIAGNOSIS — Z202 Contact with and (suspected) exposure to infections with a predominantly sexual mode of transmission: Secondary | ICD-10-CM

## 2019-11-15 HISTORY — DX: Gonococcal infection, unspecified: A54.9

## 2019-11-15 LAB — RAPID HIV SCREEN (HIV 1/2 AB+AG)
HIV 1/2 Antibodies: NONREACTIVE
HIV-1 P24 Antigen - HIV24: NONREACTIVE

## 2019-11-15 LAB — GROUP A STREP BY PCR: Group A Strep by PCR: NOT DETECTED

## 2019-11-15 LAB — HIV ANTIBODY (ROUTINE TESTING W REFLEX): HIV Screen 4th Generation wRfx: NONREACTIVE

## 2019-11-15 MED ORDER — LIDOCAINE HCL (PF) 1 % IJ SOLN
INTRAMUSCULAR | Status: AC
  Administered 2019-11-15: 5 mL
  Filled 2019-11-15: qty 5

## 2019-11-15 MED ORDER — CEFTRIAXONE SODIUM 500 MG IJ SOLR
500.0000 mg | Freq: Once | INTRAMUSCULAR | Status: AC
  Administered 2019-11-15: 500 mg via INTRAMUSCULAR
  Filled 2019-11-15: qty 500

## 2019-11-15 NOTE — ED Provider Notes (Signed)
[facility] EMERGENCY DEPARTMENT Provider Note   CSN: [id number] Arrival date & time: 11/15/19  1615     History Chief Complaint  Patient presents with  . SEXUALLY TRANSMITTED DISEASE    throat    Dwayne Sandoval is a 24 y.o. male.  Patient thinks he has gonorrhea of the throat again.  The history is provided by the patient.  Sore Throat This is a recurrent problem. The current episode started more than 2 days ago. The problem occurs daily. The problem has not changed since onset.Pertinent negatives include no chest pain, no abdominal pain, no headaches and no shortness of breath. Nothing aggravates the symptoms. Nothing relieves the symptoms. He has tried nothing for the symptoms. The treatment provided no relief.       Past Medical History:  Diagnosis Date  . Gonorrhea   . 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 High risk sexual behavior 07/30/2018  . DGI (disseminated gonococcal infection) ([place])   . 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 No pertinent surgical history.     Family History  Problem Relation Age of Onset  . Diabetes Mother   . Healthy Father     Social History   Tobacco Use  . Smoking status: Passive Smoke Exposure - Never Smoker  . Smokeless tobacco: Never Used  Substance Use Topics  . Alcohol use: Yes    Comment: occ  . Drug use: Never    Home Medications Prior to Admission medications   Medication Sig Start Date End Date Taking? Authorizing Provider  doxycycline (VIBRAMYCIN) 100 MG capsule Take 1 capsule (100 mg total) by mouth 2 (two) times daily. 10/15/19   Jaynee Eagles, PA-C    Allergies    Patient has no known allergies.  Review of Systems   Review of Systems  Constitutional: Negative for chills and fever.  HENT: Positive for sore throat. Negative for ear pain.   Eyes: Negative for pain and visual disturbance.  Respiratory: Negative for  cough and shortness of breath.   Cardiovascular: Negative for chest pain and palpitations.  Gastrointestinal: Negative for abdominal pain and vomiting.  Genitourinary: Negative for dysuria and hematuria.  Musculoskeletal: Negative for arthralgias and back pain.  Skin: Negative for color change and rash.  Neurological: Negative for seizures, syncope and headaches.  All other systems reviewed and are negative.   Physical Exam Updated Vital Signs BP (!) 127/99 (BP Location: Right Arm)   Pulse 76   Temp 98.9 F (37.2 C) (Oral)   Resp 20   Ht 5\' 11"  (1.803 m)   Wt 68 kg   SpO2 98%   BMI 20.92 kg/m   Physical Exam Vitals and nursing note reviewed.  Constitutional:      General: He is not in acute distress.    Appearance: He is well-developed. He is not ill-appearing.  HENT:     Head: Normocephalic and atraumatic.     Mouth/Throat:     Mouth: Mucous membranes are moist.     Pharynx: No oropharyngeal exudate or posterior oropharyngeal erythema.  Eyes:     Conjunctiva/sclera: Conjunctivae normal.     Pupils: Pupils are equal, round, and reactive to light.  Cardiovascular:     Rate and Rhythm: Normal rate and regular rhythm.     Heart sounds: No murmur.  Pulmonary:     Effort: Pulmonary effort is normal. No respiratory distress.  Breath sounds: Normal breath sounds.  Abdominal:     Palpations: Abdomen is soft.     Tenderness: There is no abdominal tenderness.  Musculoskeletal:     Cervical back: Normal range of motion and neck supple.  Skin:    General: Skin is warm and dry.     Capillary Refill: Capillary refill takes less than 2 seconds.     Findings: No erythema or rash.  Neurological:     General: No focal deficit present.     Mental Status: He is alert.     ED Results / Procedures / Treatments   Labs (all labs ordered are listed, but only abnormal results are displayed) Labs Reviewed  GROUP A STREP BY PCR  RAPID HIV SCREEN (HIV 1/2 AB+AG)  HIV ANTIBODY  (ROUTINE TESTING W REFLEX)  RPR  GC/CHLAMYDIA PROBE AMP ([place]) NOT AT [facility]  GC/CHLAMYDIA PROBE AMP ([place]) NOT AT [facility]    EKG None  Radiology No results found.  Procedures Procedures (including critical care time)  Medications Ordered in ED Medications  cefTRIAXone (ROCEPHIN) injection 500 mg (500 mg Intramuscular Given 11/15/19 1856)  lidocaine (PF) (XYLOCAINE) 1 % injection (5 mLs  Given 11/15/19 1857)    ED Course  I have reviewed the triage vital signs and the nursing notes.  Pertinent labs & imaging results that were available during my care of the patient were reviewed by me and considered in my medical decision making (see chart for details).    MDM Rules/Calculators/A&P                      Dwayne Sandoval is a 24 year old male here for STD screening.  Has sore throat again.  Positive for gonorrhea in the past.  No joint pain, no rash.  No concern for disseminated gonorrhea.  Normal vitals.  No fever.  He would like to be tested for HIV as well.  No GU symptoms.  Empirically will treat with IM Rocephin.  Will test for strep pharyngitis as well.  Throat overall is clear.  No exudates, no abscess.  Strep negative.  Rapid HIV negative.  Patient given information to follow-up with infectious disease given recurrent gonorrhea and want for further education about disease process and may be HIV prophylaxis.  This chart was dictated using voice recognition software.  Despite best efforts to proofread,  errors can occur which can change the documentation meaning.     Final Clinical Impression(s) / ED Diagnoses Final diagnoses:  Possible exposure to STD    Rx / DC Orders ED Discharge Orders    None       Virgina Norfolk, DO 11/15/19 1947

## 2019-11-15 NOTE — ED Notes (Signed)
Patient verbalizes understanding of discharge instructions. Opportunity for questioning and answers were provided. Armband removed by staff, pt discharged from ED to home in POV, aware of need to abstain from sexual activity until results available.

## 2019-11-15 NOTE — Discharge Instructions (Addendum)
Please abstain from any sexual activity until you hear back from results.  Information has been provided for infectious disease follow-up with Dr. Algis Liming please call and schedule an appointment.

## 2019-11-15 NOTE — ED Triage Notes (Signed)
Pt states he was treated for gonorrhea in his throat in February.  States he feels like he still has it.  Denies sore throat.

## 2019-11-16 LAB — RPR: RPR Ser Ql: NONREACTIVE

## 2020-02-26 ENCOUNTER — Encounter (HOSPITAL_COMMUNITY): Payer: Self-pay | Admitting: Emergency Medicine

## 2020-02-26 ENCOUNTER — Emergency Department (HOSPITAL_COMMUNITY)
Admission: EM | Admit: 2020-02-26 | Discharge: 2020-02-26 | Discharge status: Against Medical Advice | Payer: Medicaid Other

## 2020-02-26 MED ORDER — ACETAMINOPHEN 325 MG PO TABS: 650.0000 mg | ORAL_TABLET | Freq: Once | ORAL | Status: DC | PRN

## 2020-02-27 ENCOUNTER — Other Ambulatory Visit: Payer: Self-pay

## 2020-02-27 ENCOUNTER — Ambulatory Visit (HOSPITAL_COMMUNITY)
Admission: EM | Admit: 2020-02-27 | Discharge: 2020-02-27 | Disposition: A | Discharge status: Home / Self Care | Payer: Medicaid Other | Attending: Family Medicine | Admitting: Family Medicine

## 2020-02-27 ENCOUNTER — Encounter (HOSPITAL_COMMUNITY): Payer: Self-pay

## 2020-02-27 DIAGNOSIS — Z1152 Encounter for screening for COVID-19: Secondary | ICD-10-CM | POA: Diagnosis present

## 2020-02-27 DIAGNOSIS — J029 Acute pharyngitis, unspecified: Secondary | ICD-10-CM | POA: Diagnosis not present

## 2020-02-27 DIAGNOSIS — Z113 Encounter for screening for infections with a predominantly sexual mode of transmission: Secondary | ICD-10-CM | POA: Insufficient documentation

## 2020-02-27 LAB — HIV ANTIBODY (ROUTINE TESTING W REFLEX): HIV Screen 4th Generation wRfx: NONREACTIVE

## 2020-02-27 LAB — POCT RAPID STREP A, ED / UC: Streptococcus, Group A Screen (Direct): NEGATIVE

## 2020-02-27 NOTE — Discharge Instructions (Addendum)
You have been tested for COVID-19 today. If your test returns positive, you will receive a phone call from [facility] regarding your results. Negative test results are not called. Both positive and negative results area always visible on MyChart. If you do not have a MyChart account, sign up instructions are provided in your discharge papers. Please do not hesitate to contact us should you have questions or concerns.  You may use over the counter ibuprofen or acetaminophen as needed.  For a sore throat, over the counter products such as [employer] Peroxyl Mouth Sore Rinse or Chloraseptic Sore Throat Spray may provide some temporary relief. Your rapid strep test was negative today. We have sent your throat swab for culture and will let you know of any positive results.  We have sent testing for sexually transmitted infections. We will notify you of any positive results once they are received. If required, we will prescribe any medications you might need.  Please refrain from all sexual activity for at least the next seven days.

## 2020-02-27 NOTE — ED Triage Notes (Signed)
Patient complains of sore throat that is worse when swallowing and periumbilical abdominal pain x3 days. Requesting covid testing as well as STD testing; denies STD exposure or COVID exposure.

## 2020-02-28 LAB — RPR: RPR Ser Ql: NONREACTIVE

## 2020-02-28 LAB — SARS CORONAVIRUS 2 (TAT 6-24 HRS): SARS Coronavirus 2: POSITIVE — AB

## 2020-02-29 ENCOUNTER — Telehealth: Payer: Self-pay | Admitting: *Deleted

## 2020-02-29 LAB — CYTOLOGY, (ORAL, ANAL, URETHRAL) ANCILLARY ONLY
Chlamydia: NEGATIVE
Chlamydia: NEGATIVE
Comment: NEGATIVE
Comment: NORMAL
Comment: NEGATIVE
Comment: NEGATIVE
Comment: NORMAL
Neisseria Gonorrhea: NEGATIVE
Neisseria Gonorrhea: NEGATIVE
Trichomonas: NEGATIVE

## 2020-02-29 LAB — CULTURE, GROUP A STREP (THRC)

## 2020-02-29 NOTE — Telephone Encounter (Signed)
Dwayne Sandoval presented to the ED and left before being seen by the provider on 02/26/20 The patient has been enrolled in an automated general discharge outreach program and 2 attempts to contact the patient will be made to follow up on their ED visit and subsequent needs. The care management team is available to provide assistance to this patient at any time.   Burnard Bunting, RN, BSN, CCRN Patient Engagement Center [phone]

## 2020-02-29 NOTE — ED Provider Notes (Signed)
[facility]   [id number] 02/27/20 Arrival Time: 1657  ASSESSMENT & PLAN:  1. Sore throat   2. Encounter for screening for COVID-19   3. Screening for STDs (sexually transmitted diseases)     No empiric tx desired. Await results of pending cytology. COVID testing sent.   Discharge Instructions     You have been tested for COVID-19 today. If your test returns positive, you will receive a phone call from [facility] regarding your results. Negative test results are not called. Both positive and negative results area always visible on MyChart. If you do not have a MyChart account, sign up instructions are provided in your discharge papers. Please do not hesitate to contact us should you have questions or concerns.   You may use over the counter ibuprofen or acetaminophen as needed.   For a sore throat, over the counter products such as [employer] Peroxyl Mouth Sore Rinse or Chloraseptic Sore Throat Spray may provide some temporary relief.  Your rapid strep test was negative today. We have sent your throat swab for culture and will let you know of any positive results.  We have sent testing for sexually transmitted infections. We will notify you of any positive results once they are received. If required, we will prescribe any medications you might need.  Please refrain from all sexual activity for at least the next seven days.      Will notify of any positive results. Instructed to refrain from sexual activity for at least seven days.  Reviewed expectations re: course of current medical issues. Questions answered. Outlined signs and symptoms indicating need for more acute intervention. Patient verbalized understanding. After Visit Summary given.   SUBJECTIVE:  Dwayne Sandoval is a 24 y.o. male who requests STD and COVID testing. Mild sore throat. Mild abdominal discomfort for 2-3 days. Normal PO intake without n/v/d. No penile discharge. Is sexually active. No known  COVID exposure. Has not received COVID vaccine.  OBJECTIVE:  Vitals:   02/27/20 1756  BP: 112/74  Pulse: 72  Resp: 14  Temp: 98.4 F (36.9 C)  SpO2: 99%     General appearance: alert, cooperative, appears stated age and no distress Throat: lips, mucosa, and tongue normal; teeth and gums normal Lungs: unlabored respirations; speaks full sentences without difficulty Back: no CVA tenderness; FROM at waist Abdomen: soft, non-tender GU: normal appearing genitalia Skin: warm and dry Psychological: alert and cooperative; normal mood and affect.    Labs Reviewed  SARS CORONAVIRUS 2 (TAT 6-24 HRS) -   CULTURE, GROUP A STREP ([facility])  HIV ANTIBODY (ROUTINE TESTING W REFLEX)  RPR  POCT RAPID STREP A, ED / UC  CYTOLOGY, (ORAL, ANAL, URETHRAL) ANCILLARY ONLY  CYTOLOGY, (ORAL, ANAL, URETHRAL) ANCILLARY ONLY    No Known Allergies  Past Medical History:  Diagnosis Date  . Gonorrhea   . 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 Diabetes Mother   . Healthy Father    Social History   Socioeconomic History  . Marital status: Single    Spouse name: Not on file  . Number of children: Not on file  . Years of education: Not on file  . Highest education level: Not on file  Occupational History  . Not on file  Tobacco Use  . Smoking status: Passive Smoke Exposure - Never Smoker  . Smokeless tobacco: Never Used  Vaping Use  . Vaping Use: Never used  Substance and Sexual  Activity  . Alcohol use: Yes    Comment: occ  . Drug use: Never  . Sexual activity: Not on file  Other Topics Concern  . Not on file  Social History Narrative  . Not on file   Social Determinants of Health   Financial Resource Strain:   . Difficulty of Paying Living Expenses:   Food Insecurity:   . Worried About Programme researcher, broadcasting/film/video in the Last Year:   . Barista in the Last Year:   Transportation Needs:   . Freight forwarder (Medical):   Marland Kitchen Lack of  Transportation (Non-Medical):   Physical Activity:   . Days of Exercise per Week:   . Minutes of Exercise per Session:   Stress:   . Feeling of Stress :   Social Connections:   . Frequency of Communication with Friends and Family:   . Frequency of Social Gatherings with Friends and Family:   . Attends Religious Services:   . Active Member of Clubs or Organizations:   . Attends Banker Meetings:   Marland Kitchen Marital Status:   Intimate Partner Violence:   . Fear of Current or Ex-Partner:   . Emotionally Abused:   Marland Kitchen Physically Abused:   . Sexually Abused:           Mardella Layman, MD 02/29/20 [phone]

## 2020-03-16 ENCOUNTER — Encounter (HOSPITAL_COMMUNITY): Payer: Self-pay

## 2020-03-16 ENCOUNTER — Other Ambulatory Visit: Payer: Self-pay

## 2020-03-16 ENCOUNTER — Ambulatory Visit (HOSPITAL_COMMUNITY)
Admission: EM | Admit: 2020-03-16 | Discharge: 2020-03-16 | Disposition: A | Discharge status: Home / Self Care | Payer: Medicaid Other | Attending: Family Medicine | Admitting: Family Medicine

## 2020-03-16 DIAGNOSIS — L089 Local infection of the skin and subcutaneous tissue, unspecified: Secondary | ICD-10-CM

## 2020-03-16 DIAGNOSIS — L609 Nail disorder, unspecified: Secondary | ICD-10-CM

## 2020-03-16 DIAGNOSIS — B9689 Other specified bacterial agents as the cause of diseases classified elsewhere: Secondary | ICD-10-CM

## 2020-03-16 MED ORDER — DOXYCYCLINE HYCLATE 100 MG PO CAPS
100.0000 mg | ORAL_CAPSULE | Freq: Two times a day (BID) | ORAL | 0 refills | Status: DC
Start: 2020-03-16 — End: 2020-06-29

## 2020-03-16 NOTE — ED Triage Notes (Signed)
Pt is here with left big toe nail pain that started last night, pt has not taken anything to relieve discomfort.

## 2020-03-17 NOTE — ED Provider Notes (Signed)
[facility]   [id number] 03/16/20 Arrival Time: 1950  ASSESSMENT & PLAN:  1. Bacterial skin infection   2. Loose toenail     Really needs podiatry evaluation. Question infection around proximal toenail; no paronychia.  Meds ordered this encounter  Medications  . doxycycline (VIBRAMYCIN) 100 MG capsule    Sig: Take 1 capsule (100 mg total) by mouth 2 (two) times daily.    Dispense:  14 capsule    Refill:  0      Follow-up Information    Schedule an appointment as soon as possible for a visit  with [facility]).   Contact information: [address] [place],  [place]  [address]  [phone]              Reviewed expectations re: course of current medical issues. Questions answered. Outlined signs and symptoms indicating need for more acute intervention. Understanding verbalized. After Visit Summary given.   SUBJECTIVE: History from: patient. Dwayne Sandoval is a 24 y.o. male who reports L great toenail "is loose and maybe infected; smells with some drainage". Some tenderness. Normal ambulation. Has acrylic toenails. No bleeding. Afebrile.     OBJECTIVE:  Vitals:   03/16/20 2037  BP: 123/79  Pulse: (!) 110  Resp: 17  Temp: 98.2 F (36.8 C)  TempSrc: Oral  SpO2: 96%    General appearance: alert; no distress Extremities: no edema; L great toenail is loose; mild foul odor present; no drainage or bleeding Skin: warm and dry Neurologic: normal gait Psychological: alert and cooperative; normal mood and affect    No Known Allergies  Past Medical History:  Diagnosis Date  . Gonorrhea   . 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 Marital status: Single    Spouse name: Not on file  . Number of children: Not on file  . Years of education: Not on file  . Highest education level: Not on file  Occupational History  . Not on file  Tobacco Use  . Smoking status: Passive Smoke  Exposure - Never Smoker  . Smokeless tobacco: Never Used  Vaping Use  . Vaping Use: Never used  Substance and Sexual Activity  . Alcohol use: Yes    Comment: occ  . Drug use: Never  . Sexual activity: Yes  Other Topics Concern  . Not on file  Social History Narrative  . Not on file   Social Determinants of Health   Financial Resource Strain:   . Difficulty of Paying Living Expenses: Not on file  Food Insecurity:   . Worried About Programme researcher, broadcasting/film/video in the Last Year: Not on file  . Ran Out of Food in the Last Year: Not on file  Transportation Needs:   . Lack of Transportation (Medical): Not on file  . Lack of Transportation (Non-Medical): Not on file  Physical Activity:   . Days of Exercise per Week: Not on file  . Minutes of Exercise per Session: Not on file  Stress:   . Feeling of Stress : Not on file  Social Connections:   . Frequency of Communication with Friends and Family: Not on file  . Frequency of Social Gatherings with Friends and Family: Not on file  . Attends Religious Services: Not on file  . Active Member of Clubs or Organizations: Not on file  . Attends Banker Meetings: Not on file  . Marital Status: Not on file  Intimate Partner Violence:   . Fear of Current or Ex-Partner: Not on file  . Emotionally Abused: Not on file  . Physically Abused: Not on file  . Sexually Abused: Not on file   Family History  Problem Relation Age of Onset  . Diabetes Mother   . Healthy Father    History reviewed. No pertinent surgical history.   Mardella Layman, MD 03/17/20 [phone]

## 2020-06-10 ENCOUNTER — Encounter (HOSPITAL_COMMUNITY): Payer: Self-pay | Admitting: Emergency Medicine

## 2020-06-10 ENCOUNTER — Other Ambulatory Visit: Payer: Self-pay

## 2020-06-10 ENCOUNTER — Emergency Department (HOSPITAL_COMMUNITY)
Admission: EM | Admit: 2020-06-10 | Discharge: 2020-06-10 | Disposition: A | Discharge status: Home / Self Care | Payer: Medicaid Other | Attending: Emergency Medicine | Admitting: Emergency Medicine

## 2020-06-10 DIAGNOSIS — T304 Corrosion of unspecified body region, unspecified degree: Secondary | ICD-10-CM

## 2020-06-10 DIAGNOSIS — T23401A Corrosion of unspecified degree of right hand, unspecified site, initial encounter: Secondary | ICD-10-CM | POA: Diagnosis not present

## 2020-06-10 DIAGNOSIS — T6591XA Toxic effect of unspecified substance, accidental (unintentional), initial encounter: Secondary | ICD-10-CM | POA: Insufficient documentation

## 2020-06-10 DIAGNOSIS — T310 Burns involving less than 10% of body surface: Secondary | ICD-10-CM | POA: Diagnosis not present

## 2020-06-10 NOTE — ED Provider Notes (Signed)
[facility] EMERGENCY DEPARTMENT Provider Note  CSN: [id number] Arrival date & time: 06/10/20 0304  Chief Complaint(s) Rash  HPI Dwayne Sandoval is a 24 y.o. male here after mixing Kaboom with Mr[employer] Brigid [employer] chemicals and noticing white residue on his right hand. He reports feeling itching. He rinsed his hands with soap and water which did not remove the white spots. Since arriving here, the spots have almost cleared up. He denies any redness, eye irritation, SOB. No other physical complaints.  Additionally, he is requesting "routine" STD check stating that he had intercourse last month. Denies symptoms.   HPI  Past Medical History Past Medical History:  Diagnosis Date  . Gonorrhea   . 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 High risk sexual behavior 07/30/2018  . DGI (disseminated gonococcal infection) (HCC)   . 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 Authorizing Provider  doxycycline (VIBRAMYCIN) 100 MG capsule Take 1 capsule (100 mg total) by mouth 2 (two) times daily. 03/16/20   Mardella Layman, MD                                                                                                                                    Past Surgical History History reviewed. No pertinent surgical history. Family History Family History  Problem Relation Age of Onset  . Diabetes Mother   . Healthy Father     Social History Social History   Tobacco Use  . Smoking status: Passive Smoke Exposure - Never Smoker  . Smokeless tobacco: Never Used  Vaping Use  . Vaping Use: Never used  Substance Use Topics  . Alcohol use: Yes    Comment: occ  . Drug use: Never   Allergies Patient has no known allergies.  Review of Systems Review of Systems All other systems are reviewed and are negative for acute change except as noted in  the HPI  Physical Exam Vital Signs  I have reviewed the triage vital signs BP 131/84 (BP Location: Right Arm)   Pulse 68   Temp 98.1 F (36.7 C) (Oral)   Resp 16   SpO2 100%   Physical Exam Vitals reviewed.  Constitutional:      General: He is not in acute distress.    Appearance: He is well-developed. He is not diaphoretic.  HENT:     Head: Normocephalic and atraumatic.     Jaw: No trismus.     Right Ear: External ear normal.     Left Ear: External ear normal.     Nose: Nose normal.  Eyes:     General: No scleral icterus.    Conjunctiva/sclera: Conjunctivae normal.  Neck:     Trachea: Phonation normal.  Cardiovascular:     Rate and Rhythm: Normal rate and regular  rhythm.  Pulmonary:     Effort: Pulmonary effort is normal. No respiratory distress.     Breath sounds: No stridor.  Abdominal:     General: There is no distension.  Musculoskeletal:        General: Normal range of motion.       Hands:     Cervical back: Normal range of motion.     Comments: Acrylic fingernails present  Neurological:     Mental Status: He is alert and oriented to person, place, and time.  Psychiatric:        Behavior: Behavior normal.     ED Results and Treatments Labs (all labs ordered are listed, but only abnormal results are displayed) Labs Reviewed - No data to display                                                                                                                       EKG  EKG Interpretation  Date/Time:    Ventricular Rate:    PR Interval:    QRS Duration:   QT Interval:    QTC Calculation:   R Axis:     Text Interpretation:        Radiology No results found.  Pertinent labs & imaging results that were available during my care of the patient were reviewed by me and considered in my medical decision making (see chart for details).  Medications Ordered in ED Medications - No data to display                                                                                                                                   Procedures Procedures  (including critical care time)  Medical Decision Making / ED Course I have reviewed the nursing notes for this encounter and the patient's prior records (if available in EHR or on provided paperwork).   Dwayne Sandoval was evaluated in Emergency Department on 06/10/2020 for the symptoms described in the history of present illness. He was evaluated in the context of the global COVID-19 pandemic, which necessitated consideration that the patient might be at risk for infection with the SARS-CoV-2 virus that causes COVID-19. Institutional protocols and algorithms that pertain to the evaluation of patients at risk for COVID-19 are in a state of rapid change based on information released by regulatory bodies including the CDC and federal and state organizations. These policies and algorithms were followed  during the patient's care in the ED.  Based on history, this is likely a chemical residue or mild burn from mixing chemical.  After looking up ingredients possible that mixing of the chemicals resulting in sodium peroxide, which is caustic and can produce these white markings.  Patient was instructed to continue rinsing.  This appears to be of a minor encounter.   After rinsing for an additional 15 minutes, all residue was gone. No burning sensation.   Instructed to follow up with PCP for routine STD check since he is asymptomatic.      Final Clinical Impression(s) / ED Diagnoses Final diagnoses:  Chemical burn    The patient appears reasonably screened and/or stabilized for discharge and I doubt any other medical condition or other [facility] requiring further screening, evaluation, or treatment in the ED at this time prior to discharge. Safe for discharge with strict return precautions.  Disposition: Discharge  Condition: Good  I have discussed the results, Dx and Tx plan with the patient/family who  expressed understanding and agree(s) with the plan. Discharge instructions discussed at length. The patient/family was given strict return precautions who verbalized understanding of the instructions. No further questions at time of discharge.    ED Discharge Orders    None     Follow Up: Peds, [facility]  To schedule an appointment for close follow up     This chart was dictated using voice recognition software.  Despite best efforts to proofread,  errors can occur which can change the documentation meaning.   Nira Conn, MD 06/10/20 Jeralyn Bennett

## 2020-06-10 NOTE — ED Notes (Signed)
Pt reports itching to right hand went away on its own. Provider again at bedside instructing pt to wash hands for .

## 2020-06-10 NOTE — ED Notes (Signed)
ED Provider at bedside. 

## 2020-06-10 NOTE — ED Triage Notes (Signed)
Pt was cleaning his bathroom w/ several different chemicals, noticed a white rash on his hands, it also felt as if it was burning.    Requested an STD test as well.

## 2020-06-10 NOTE — ED Notes (Signed)
Discharge instructions gone over with patient with no questions asked.

## 2020-06-13 ENCOUNTER — Telehealth: Payer: Self-pay

## 2020-06-13 NOTE — Telephone Encounter (Signed)
Transition Care Management Follow-up Telephone Call  Date of discharge and from where: 06/10/2020 [facility]  How have you been since you were released from the hospital? Doing a lot better.  Any questions or concerns? No  Items Reviewed:  Did the pt receive and understand the discharge instructions provided? Yes   Medications obtained and verified? na  Other? No   Any new allergies since your discharge? No   Dietary orders reviewed? Yes  Do you have support at home? Yes   Home Care and Equipment/Supplies: Were home health services ordered? not applicable If so, what is the name of the agency? na  Has the agency set up a time to come to the patient's home? not applicable Were any new equipment or medical supplies ordered?  No What is the name of the medical supply agency? na Were you able to get the supplies/equipment? not applicable Do you have any questions related to the use of the equipment or supplies? No  Functional Questionnaire: (I = Independent and D = Dependent) ADLs: I  Bathing/Dressing- I  Meal Prep- I  Eating- I  Maintaining continence- I  Transferring/Ambulation- I  Managing Meds- I  Follow up appointments reviewed:   PCP Hospital f/u appt confirmed? Yes  Scheduled to see Thad Ranger, NP on 06/22/2020 @ 2pm.  Specialist [facility] f/u appt confirmed? No   Are transportation arrangements needed? No   If their condition worsens, is the pt aware to call PCP or go to the Emergency Dept.? Yes  Was the patient provided with contact information for the PCP's office or ED? Yes  Was to pt encouraged to call back with questions or concerns? Yes

## 2020-06-22 ENCOUNTER — Ambulatory Visit (INDEPENDENT_AMBULATORY_CARE_PROVIDER_SITE_OTHER): Payer: Medicaid Other | Admitting: Nurse Practitioner

## 2020-06-22 ENCOUNTER — Other Ambulatory Visit: Payer: Self-pay

## 2020-06-22 ENCOUNTER — Encounter: Payer: Self-pay | Admitting: Nurse Practitioner

## 2020-06-22 VITALS — BP 125/74 | HR 64 | Temp 98.2°F | Resp 16 | Ht 70.47 in | Wt 165.4 lb

## 2020-06-22 DIAGNOSIS — Z113 Encounter for screening for infections with a predominantly sexual mode of transmission: Secondary | ICD-10-CM

## 2020-06-22 DIAGNOSIS — Z7689 Persons encountering health services in other specified circumstances: Secondary | ICD-10-CM | POA: Diagnosis not present

## 2020-06-22 LAB — POCT URINALYSIS DIP (CLINITEK)
Bilirubin, UA: NEGATIVE
Glucose, UA: NEGATIVE mg/dL
Ketones, POC UA: NEGATIVE mg/dL
Leukocytes, UA: NEGATIVE
Nitrite, UA: NEGATIVE
POC PROTEIN,UA: NEGATIVE
Spec Grav, UA: 1.025 (ref 1.010–1.025)
Urobilinogen, UA: 2 E.U./dL — AB
pH, UA: 7 (ref 5.0–8.0)

## 2020-06-22 LAB — POCT GLYCOSYLATED HEMOGLOBIN (HGB A1C): Hemoglobin A1C: 5.4 % (ref 4.0–5.6)

## 2020-06-22 NOTE — Patient Instructions (Signed)

## 2020-06-22 NOTE — Progress Notes (Signed)
[facility] Patient [facility] [address] [place], [place]  [address] Phone:  [phone]   Fax:  [phone]   New Patient Office Visit  Subjective:  Patient ID: Dwayne Sandoval, male    DOB: 1995-11-17  Age: 24 y.o. MRN: [id number]  CC:  Chief Complaint  Patient presents with  . Establish Care    HPI Dwayne Sandoval presents to establish care and  has a past medical history of Gonorrhea and Dwayne Sandoval syndrome (HCC).  Denies headache, dizziness, visual changes, shortness of breath, dyspnea on exertion, chest pain, nausea, vomiting or edema ,oral lesions,, sore throat, pelvic pain, penile discharge, dysuria, nocturia any visible ulcers or lesions    Past Medical History:  Diagnosis Date  . Gonorrhea   . 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  Family History  Problem Relation Age of Onset  . Diabetes Mother   . Healthy Father     Social History   Socioeconomic History  . Marital status: Single    Spouse name: Not on file  . Number of children: Not on file  . Years of education: Not on file  . Highest education level: Not on file  Occupational History  . Not on file  Tobacco Use  . Smoking status: Passive Smoke Exposure - Never Smoker  . Smokeless tobacco: Never Used  Vaping Use  . Vaping Use: Never used  Substance and Sexual Activity  . Alcohol use: Yes    Comment: occ  . Drug use: Never  . Sexual activity: Yes  Other Topics Concern  . Not on file  Social History Narrative  . Not on file   Social Determinants of Health   Financial Resource Strain: Not on file  Food Insecurity: Not on file  Transportation Needs: Not on file  Physical Activity: Not on file  Stress: Not on file  Social Connections: Not on file  Intimate Partner Violence: Not on file    ROS Review of Systems  Constitutional: Negative.   HENT: Negative.   Eyes: Negative.   Respiratory: Negative.   Cardiovascular: Negative.   Gastrointestinal:  Negative.   Endocrine: Negative.   Genitourinary: Negative.   Musculoskeletal: Negative.   Skin: Negative.   Allergic/Immunologic: Negative.   Neurological: Negative.   Hematological: Negative.   Psychiatric/Behavioral: Negative.        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     Nose: Nose normal.     Mouth/Throat:     Mouth: Mucous membranes are moist.  Cardiovascular:     Rate and Rhythm: Normal rate and regular rhythm.     Pulses: Normal pulses.     Heart sounds: Normal heart sounds.  Pulmonary:     Effort: Pulmonary effort is normal.     Breath sounds: Normal breath sounds.  Abdominal:     General: Bowel sounds are normal.     Palpations: Abdomen is soft.  Musculoskeletal:        General: Normal range of motion.  Cervical back: Normal range of motion.  Skin:    General: Skin is warm and dry.     Capillary Refill: Capillary refill takes less than 2 seconds.  Neurological:     General: No focal deficit present.     Mental Status: He is alert and oriented to person, place, and time.  Psychiatric:        Mood and Affect: Mood normal.        Behavior: Behavior normal.        Thought Content: Thought content normal.        Judgment: Judgment normal.     Assessment & Plan:   Problem List Items Addressed This Visit   None   Visit Diagnoses    Encounter to establish care    -  Primary Health maintenance and overall safety    Relevant Orders   HgB A1c (Completed)   POCT URINALYSIS DIP (CLINITEK) (Completed)   Screening for STD (sexually transmitted disease)       Relevant Orders   STD Screen (8) (Completed)    Chlamydia/Gonococcus/Trichomonas, NAA (Completed)      Outpatient Encounter Medications as of 06/22/2020  Medication Sig  . [DISCONTINUED] doxycycline (VIBRAMYCIN) 100 MG capsule Take 1 capsule (100 mg total) by mouth 2 (two) times daily. (Patient not taking: Reported on 06/22/2020)   No facility-administered encounter medications on file as of 06/22/2020.    Follow-up: Return in about 1 year (around 06/22/2021) for follow up as needed.   Barbette Merino, NP

## 2020-06-23 LAB — STD SCREEN (8)
HIV Screen 4th Generation wRfx: NONREACTIVE
HSV 1 Glycoprotein G Ab, IgG: <0.91 index (ref 0.00–0.90)
HSV 2 IgG, Type Spec: <0.91 index (ref 0.00–0.90)
Hep A IgM: NEGATIVE
Hep B C IgM: NEGATIVE
Hep C Virus Ab: <0.1 s/co ratio (ref 0.0–0.9)
Hepatitis B Surface Ag: NEGATIVE
RPR Ser Ql: NONREACTIVE

## 2020-06-26 LAB — CHLAMYDIA/GONOCOCCUS/TRICHOMONAS, NAA
Chlamydia by NAA: NEGATIVE
Gonococcus by NAA: NEGATIVE
Trich vag by NAA: NEGATIVE

## 2020-06-29 ENCOUNTER — Encounter: Payer: Self-pay | Admitting: Nurse Practitioner

## 2020-08-18 ENCOUNTER — Telehealth: Payer: Self-pay

## 2020-08-18 NOTE — Telephone Encounter (Signed)
Called and spoke Pt wants letter stated that her can have emotional  support dog.

## 2020-08-18 NOTE — Telephone Encounter (Signed)
Called and spoke with patient he would like for letter emotional support day.

## 2020-08-31 ENCOUNTER — Telehealth: Payer: Self-pay

## 2020-08-31 NOTE — Telephone Encounter (Signed)
Called and spoke w/ [facility] to get patient an appt for 09/05/2020  @ 1pm  Called and spoke with patient give patient the name , address and phone

## 2020-09-05 DIAGNOSIS — F411 Generalized anxiety disorder: Secondary | ICD-10-CM | POA: Diagnosis not present

## 2020-09-21 ENCOUNTER — Encounter: Payer: Self-pay | Admitting: Nurse Practitioner

## 2020-09-21 ENCOUNTER — Ambulatory Visit (INDEPENDENT_AMBULATORY_CARE_PROVIDER_SITE_OTHER): Payer: Medicaid Other | Admitting: Nurse Practitioner

## 2020-09-21 ENCOUNTER — Ambulatory Visit: Payer: Medicaid Other | Admitting: Nurse Practitioner

## 2020-09-21 ENCOUNTER — Other Ambulatory Visit: Payer: Self-pay

## 2020-09-21 VITALS — BP 126/57 | HR 77 | Ht 70.0 in | Wt 167.0 lb

## 2020-09-21 DIAGNOSIS — Z113 Encounter for screening for infections with a predominantly sexual mode of transmission: Secondary | ICD-10-CM

## 2020-09-21 NOTE — Progress Notes (Signed)
[facility] Patient [facility] [address] [place], [place]  [address] Phone:  [phone]   Fax:  [phone]   Established Patient Office Visit  Subjective:  Patient ID: Dwayne Sandoval, male    DOB: [birth year]  Age: 25 y.o. MRN: [id number]  CC:  Chief Complaint  Patient presents with  . Screening for STD    HPI Dwayne Sandoval presents for follow up. Dwayne is in today for screening for STDs. Denies any symptoms. Denies oral lesions,, sore throat, pelvic pain, penile discharge, dysuria, nocturia any visible ulcers or lesions  Past Medical History:  Diagnosis Date  . Gonorrhea   . 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 No pertinent surgical history.  Family History  Problem Relation Age of Onset  . Diabetes Mother   . Healthy Father     Social History   Socioeconomic History  . Marital status: Single    Spouse name: Not on file  . Number of children: Not on file  . Years of education: Not on file  . Highest education level: Not on file  Occupational History  . Not on file  Tobacco Use  . Smoking status: Passive Smoke Exposure - Never Smoker  . Smokeless tobacco: Never Used  Vaping Use  . Vaping Use: Never used  Substance and Sexual Activity  . Alcohol use: Yes    Comment: occ  . Drug use: Never  . Sexual activity: Yes  Other Topics Concern  . Not on file  Social History Narrative  . Not on file   Social Determinants of Health   Financial Resource Strain: Not on file  Food Insecurity: Not on file  Transportation Needs: Not on file  Physical Activity: Not on file  Stress: Not on file  Social Connections: Not on file  Intimate Partner Violence: Not on file    No outpatient medications prior to visit.   No facility-administered medications prior to visit.    No Known Allergies  ROS Review of Systems    Objective:    Physical Exam Constitutional:      General: He is not in acute distress.    Appearance: He is normal  weight. He is not ill-appearing, toxic-appearing or diaphoretic.  HENT:     Head: Normocephalic and atraumatic.  Cardiovascular:     Rate and Rhythm: Normal rate and regular rhythm.     Pulses: Normal pulses.     Heart sounds: Normal heart sounds.  Pulmonary:     Effort: Pulmonary effort is normal.     Breath sounds: Normal breath sounds.  Musculoskeletal:        General: Normal range of motion.  Skin:    General: Skin is warm and dry.     Capillary Refill: Capillary refill takes less than 2 seconds.  Neurological:     General: No focal deficit present.     Mental Status: He is alert.  Psychiatric:        Mood and Affect: Mood normal.        Behavior: Behavior normal.        Thought Content: Thought content normal.        Judgment: Judgment normal.     BP (!) 126/57   Pulse 77   Ht 5\' 10"  (1.778 m)   Wt 167 lb (75.8 kg)   SpO2 98%   BMI 23.96 kg/m  Wt Readings from Last 3 Encounters:  09/21/20 167 lb (75.8 kg)  06/22/20 165  lb 6.4 oz (75 kg)  06/10/20 149 lb 14.6 oz (68 kg)     Health Maintenance Due  Topic Date Due  . HPV VACCINES (1 - Male 2-dose series) Never done       Topic Date Due  . HPV VACCINES (1 - Male 2-dose series) Never done    No results found for: TSH Lab Results  Component Value Date   WBC 6.6 06/20/2019   HGB 15.6 06/20/2019   HCT 45.6 06/20/2019   MCV 89.1 06/20/2019   PLT 336 06/20/2019   Lab Results  Component Value Date   NA 141 06/20/2019   K 4.5 06/20/2019   CO2 27 06/20/2019   GLUCOSE 114 (H) 06/20/2019   BUN 10 06/20/2019   CREATININE 1.03 06/20/2019   BILITOT 0.9 07/20/2018   ALKPHOS 92 07/20/2018   AST 37 07/20/2018   ALT 31 07/20/2018   PROT 7.1 07/20/2018   ALBUMIN 3.1 (L) 07/20/2018   CALCIUM 9.6 06/20/2019   ANIONGAP 10 06/20/2019   No results found for: CHOL No results found for: HDL No results found for: LDLCALC No results found for: TRIG No results found for: CHOLHDL Lab Results  Component Value Date    HGBA1C 5.4 06/22/2020      Assessment & Plan:   Problem List Items Addressed This Visit   None   Visit Diagnoses    Screening for STD (sexually transmitted disease)    -  Primary Discussed HPV vaccination patient was provided with reading material   Relevant Orders   Chlamydia/Gonococcus/Trichomonas, NAA   STD Screen (8)      No orders of the defined types were placed in this encounter.   Follow-up: Return for follow up as needed.    Barbette Merino, NP

## 2020-09-21 NOTE — Patient Instructions (Addendum)
HPV Vaccine Information for Parents  HPV (human papillomavirus) is a common virus that spreads easily from person to person through skin-to-skin or sexual contact. There are many types of HPV viruses. They can cause warts in the genitals (genital or mucosal HPV), or on the hands or feet (cutaneous or nonmucosal HPV). Some genital HPV types are considered high-risk and may cause cancer. Your child can get a vaccination to help prevent certain HPV infections that can cause cancer as well as those types that cause genital and anal warts. The vaccine is safe and effective. It is recommended for boys and girls at about 23-12 years of age. Getting the vaccine at this age (before he or she is sexually active) gives your child the best protection from HPV infection through adulthood. How can HPV affect my child? HPV infection can cause:  Genital warts.  Mouth or throat cancer.  Anal cancer.  Cervical, vulvar, or vaginal cancer.  Penile cancer. During pregnancy, HPV infection can be passed to the baby. This infection can cause warts to develop in a baby's throat and windpipe. What actions can I take to lower my child's risk for HPV? To lower your child's risk for genital HPV infection, have him or her get the HPV vaccine before becoming sexually active. The best time for vaccination is between ages 23 and 12, though it can be given to children as young as 23 years old. If your child gets the first dose before age 23, the vaccination can be given as 2 shots, 6-12 months apart. In some situations, 3 doses are needed.  If your child starts the vaccine before age 23 but does not have a second dose within 6-12 months after the first dose, he or she will need 3 doses to complete the vaccination. When your child has the first dose, it is important to make an appointment for the next shot and keep the appointment.  Teens who are not vaccinated before age 15 will need 3 doses, within six months of the first  dose.  If your child has a weak immune system, he or she may need 3 doses. Young adults can also get the vaccination, even if they are already sexually active and even if they have already been infected with HPV. The vaccination can still help prevent the types of cancer-causing HPV that a person has not been infected with. What are the risks and benefits of the HPV vaccine? Benefits The main benefit of getting vaccinated is to prevent certain cancers, including:  Cervical, vulvar, and vaginal cancer in females.  Penile cancer in males.  Oral and anal cancer in both males and females. The risk of these cancers is lower if your child gets vaccinated before he or she becomes sexually active. The vaccine also prevents genital warts caused by HPV. Risks The risks, although low, include side effects or reactions to the vaccine. Very few reactions have been reported, but they can include:  Soreness, redness, or swelling at the injection site.  Dizziness or headache.  Fever. Who should not get the HPV vaccine or should wait to get it? Some children should not get the HPV vaccine or should wait. Discuss the risks and benefits of the vaccine with your child's health care provider if your child:  Has had a severe allergic reaction to other vaccinations.  Is allergic to yeast.  Has a fever.  Has had a recent illness.  Is pregnant or may be pregnant. Where to find more information    Centers for Disease Control and Prevention: cdc.gov/hpv  American Academy of Pediatrics: healthychildren.org Summary  HPV (human papillomavirus) is a common virus that spreads from person to person through skin-to-skin or sexual contact. It can spread during vaginal, anal, or oral sex.  Your child can get a vaccination to prevent HPV infection and cancer. It is best to get the vaccination before becoming sexually active.  The HPV vaccine can protect your child from genital warts and certain types of  cancer, including cancer of the cervix, throat, mouth, vulva, vagina, anus, and penis.  The HPV vaccine is both safe and effective.  The best time for boys and girls to get the vaccination is when they are between ages 23 and 12. This information is not intended to replace advice given to you by your health care provider. Make sure you discuss any questions you have with your health care provider. Document Revised: 03/08/2020 Document Reviewed: 02/16/2020 Elsevier Patient Education  2021 Elsevier Inc.  

## 2020-09-22 LAB — STD SCREEN (8)
HIV Screen 4th Generation wRfx: NONREACTIVE
HSV 1 Glycoprotein G Ab, IgG: <0.91 index (ref 0.00–0.90)
HSV 2 IgG, Type Spec: <0.91 index (ref 0.00–0.90)
Hep A IgM: NEGATIVE
Hep B C IgM: NEGATIVE
Hep C Virus Ab: <0.1 s/co ratio (ref 0.0–0.9)
Hepatitis B Surface Ag: NEGATIVE
RPR Ser Ql: NONREACTIVE

## 2020-09-23 LAB — CHLAMYDIA/GONOCOCCUS/TRICHOMONAS, NAA
Chlamydia by NAA: NEGATIVE
Gonococcus by NAA: NEGATIVE
Trich vag by NAA: NEGATIVE

## 2020-10-03 DIAGNOSIS — F411 Generalized anxiety disorder: Secondary | ICD-10-CM | POA: Diagnosis not present

## 2020-10-30 ENCOUNTER — Other Ambulatory Visit: Payer: Self-pay

## 2020-10-30 ENCOUNTER — Emergency Department (HOSPITAL_COMMUNITY)
Admission: EM | Admit: 2020-10-30 | Discharge: 2020-10-31 | Disposition: A | Discharge status: Home / Self Care | Payer: Medicaid Other | Attending: Emergency Medicine | Admitting: Emergency Medicine

## 2020-10-30 ENCOUNTER — Encounter (HOSPITAL_COMMUNITY): Payer: Self-pay | Admitting: Emergency Medicine

## 2020-10-30 DIAGNOSIS — Z20822 Contact with and (suspected) exposure to covid-19: Secondary | ICD-10-CM | POA: Diagnosis not present

## 2020-10-30 DIAGNOSIS — R509 Fever, unspecified: Secondary | ICD-10-CM | POA: Insufficient documentation

## 2020-10-30 DIAGNOSIS — R0981 Nasal congestion: Secondary | ICD-10-CM | POA: Insufficient documentation

## 2020-10-30 DIAGNOSIS — Z202 Contact with and (suspected) exposure to infections with a predominantly sexual mode of transmission: Secondary | ICD-10-CM | POA: Diagnosis not present

## 2020-10-30 DIAGNOSIS — R5383 Other fatigue: Secondary | ICD-10-CM | POA: Insufficient documentation

## 2020-10-30 DIAGNOSIS — Z7722 Contact with and (suspected) exposure to environmental tobacco smoke (acute) (chronic): Secondary | ICD-10-CM | POA: Insufficient documentation

## 2020-10-30 DIAGNOSIS — Z113 Encounter for screening for infections with a predominantly sexual mode of transmission: Secondary | ICD-10-CM | POA: Diagnosis not present

## 2020-10-30 LAB — URINALYSIS, ROUTINE W REFLEX MICROSCOPIC
Bilirubin Urine: NEGATIVE
Glucose, UA: NEGATIVE mg/dL
Hgb urine dipstick: NEGATIVE
Ketones, ur: NEGATIVE mg/dL
Leukocytes,Ua: NEGATIVE
Nitrite: NEGATIVE
Protein, ur: NEGATIVE mg/dL
Specific Gravity, Urine: 1.003 — ABNORMAL LOW (ref 1.005–1.030)
pH: 6 (ref 5.0–8.0)

## 2020-10-30 MED ORDER — ACETAMINOPHEN 325 MG PO TABS
650.0000 mg | ORAL_TABLET | Freq: Once | ORAL | Status: AC
  Administered 2020-10-30: 650 mg via ORAL
  Filled 2020-10-30: qty 2

## 2020-10-30 NOTE — ED Triage Notes (Signed)
Patient states he got a new partner a month ago and since then has not felt well since. After sleeping with this partner on Thursday, he began having flue-like symptoms. Patient states highest fever at home 101.4. Patient would like to be STD checked.

## 2020-10-30 NOTE — ED Provider Notes (Signed)
MSE was initiated and I personally evaluated the patient and placed orders (if any) at  10:55 PM on October 30, 2020.  Patient here for flulike symptoms since Thursday, fever at home.  Also requesting STD check.  No known Covid or flu exposures.  BP 134/75 (BP Location: Left Arm)   Pulse (!) 110   Temp 99.4 F (37.4 C) (Oral)   Resp 16   Ht 5\' 11"  (1.803 m)   Wt 76.2 kg   SpO2 96%   BMI 23.43 kg/m   No acute distress.  Speaking in full sentences.  Steady gait.  The patient appears stable so that the remainder of the MSE may be completed by another provider.   Danaysha Kirn, N, PA-C 10/30/20 2256    11/01/20, MD 11/02/20 [phone]

## 2020-10-31 LAB — RESP PANEL BY RT-PCR (FLU A&B, COVID) ARPGX2
Influenza A by PCR: POSITIVE — AB
Influenza B by PCR: NEGATIVE
SARS Coronavirus 2 by RT PCR: NEGATIVE

## 2020-10-31 LAB — RPR: RPR Ser Ql: NONREACTIVE

## 2020-10-31 LAB — HIV ANTIBODY (ROUTINE TESTING W REFLEX): HIV Screen 4th Generation wRfx: NONREACTIVE

## 2020-10-31 NOTE — Discharge Instructions (Signed)
You were seen in the emergency department today for fever as well as for STD testing.  We suspect that your symptoms may be related to a virus, we have tested for Covid and the flu, will call you if these results are positive you may also see them in my chart.  Your STD test will take a few days to come back, if these are positive we will call you or you may see the results on MyChart.  Please take ibuprofen and or Tylenol per over-the-counter dosing to help with fevers and/or discomfort.  If your Covid test is positive please follow current CDC guidelines for isolation.  Follow-up with primary care within 3 days for reevaluation of your symptoms.  Return to the ER for any new or worsening symptoms including but not limited to inability to keep fluids down, chest pain, trouble breathing, abdominal pain, passing out, or any other concerns

## 2020-10-31 NOTE — ED Provider Notes (Signed)
[facility]-EMERGENCY DEPT Provider Note   CSN: [id number] Arrival date & time: 10/30/20  2212     History Chief Complaint  Patient presents with  . Fever  . Fatigue    Dwayne Sandoval is a 25 y.o. male who presents to the ED with complaints of fever x 3-4 days and with request for STD testing. Patient reports fever to 101.4, chills, body aches, fatigue, nasal congestion and upset stomach x 3-4 days. Feels as though sxs started after intercourse, had some nasal congestion issues a few weeks ago after first intercourse with same partner whom is new. No alleviating/aggravating factors.  No known STD exposure.  Denies chest pain, cough, dyspnea, vomiting, diarrhea, melena, medic easy, dysuria, penile discharge, or pain with bowel movements. No tic bites.   HPI     Past Medical History:  Diagnosis Date  . Gonorrhea   . 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 High risk sexual behavior 07/30/2018  . DGI (disseminated gonococcal infection) (HCC)   . 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 No pertinent surgical history.     Family History  Problem Relation Age of Onset  . Diabetes Mother   . Healthy Father     Social History   Tobacco Use  . Smoking status: Passive Smoke Exposure - Never Smoker  . Smokeless tobacco: Never Used  Vaping Use  . Vaping Use: Never used  Substance Use Topics  . Alcohol use: Yes    Comment: occ  . Drug use: Never    Home Medications Prior to Admission medications   Not on File    Allergies    Patient has no known allergies.  Review of Systems   Review of Systems  Constitutional: Positive for chills, fatigue and fever.  HENT: Positive for congestion. Negative for ear pain and sore throat.   Respiratory: Negative for cough and shortness of breath.   Cardiovascular: Negative for chest pain.  Gastrointestinal: Positive for abdominal pain  ("upset"). Negative for diarrhea and vomiting.  Genitourinary: Negative for dysuria, penile discharge, scrotal swelling and testicular pain.  Skin: Negative for rash.  Neurological: Negative for syncope.  All other systems reviewed and are negative.   Physical Exam Updated Vital Signs BP 124/72 (BP Location: Left Arm)   Pulse 96   Temp 99.4 F (37.4 C) (Oral)   Resp 16   Ht 5\' 11"  (1.803 m)   Wt 76.2 kg   SpO2 98%   BMI 23.43 kg/m   Physical Exam Vitals and nursing note reviewed.  Constitutional:      General: He is not in acute distress.    Appearance: He is well-developed. He is not toxic-appearing.  HENT:     Head: Normocephalic and atraumatic.     Right Ear: Ear canal normal. Tympanic membrane is not perforated, erythematous, retracted or bulging.     Left Ear: Ear canal normal. Tympanic membrane is not perforated, erythematous, retracted or bulging.     Ears:     Comments: No mastoid erythema/swellng/tenderness.     Nose:     Right Sinus: No maxillary sinus tenderness or frontal sinus tenderness.     Left Sinus: No maxillary sinus tenderness or frontal sinus tenderness.     Mouth/Throat:     Pharynx: Oropharynx is clear. Uvula midline. No oropharyngeal exudate or posterior oropharyngeal erythema.     Comments: Posterior oropharynx is symmetric appearing.  Patient tolerating own secretions without difficulty. No trismus. No drooling. No hot potato voice. No swelling beneath the tongue, submandibular compartment is soft.  Eyes:     General:        Right eye: No discharge.        Left eye: No discharge.     Conjunctiva/sclera: Conjunctivae normal.  Cardiovascular:     Rate and Rhythm: Normal rate and regular rhythm.  Pulmonary:     Effort: Pulmonary effort is normal. No respiratory distress.     Breath sounds: Normal breath sounds. No wheezing, rhonchi or rales.  Abdominal:     General: There is no distension.     Palpations: Abdomen is soft.     Tenderness: There  is no abdominal tenderness. There is no guarding or rebound.  Musculoskeletal:     Cervical back: Neck supple. No rigidity.  Lymphadenopathy:     Cervical: No cervical adenopathy.  Skin:    General: Skin is warm and dry.     Findings: No rash.  Neurological:     Mental Status: He is alert.  Psychiatric:        Behavior: Behavior normal.     ED Results / Procedures / Treatments   Labs (all labs ordered are listed, but only abnormal results are displayed) Labs Reviewed  URINALYSIS, ROUTINE W REFLEX MICROSCOPIC - Abnormal; Notable for the following components:      Result Value   Color, Urine STRAW (*)    Specific Gravity, Urine 1.003 (*)    All other components within normal limits  RESP PANEL BY RT-PCR (FLU A&B, COVID) ARPGX2  RPR  HIV ANTIBODY (ROUTINE TESTING W REFLEX)  GC/CHLAMYDIA PROBE AMP ([place]) NOT AT [facility]    EKG None  Radiology No results found.  Procedures Procedures   Medications Ordered in ED Medications  acetaminophen (TYLENOL) tablet 650 mg (650 mg Oral Given 10/30/20 2358)    ED Course  I have reviewed the triage vital signs and the nursing notes.  Pertinent labs & imaging results that were available during my care of the patient were reviewed by me and considered in my medical decision making (see chart for details).  Dwayne Sandoval was evaluated in Emergency Department on 10/31/2020 for the symptoms described in the history of present illness. He/she was evaluated in the context of the global COVID-19 pandemic, which necessitated consideration that the patient might be at risk for infection with the SARS-CoV-2 virus that causes COVID-19. Institutional protocols and algorithms that pertain to the evaluation of patients at risk for COVID-19 are in a state of rapid change based on information released by regulatory bodies including the CDC and federal and state organizations. These policies and algorithms were followed during the patient's care in the  ED.    MDM Rules/Calculators/A&P                         Patient presents to the ED with complaints of fever x 3-4 days also with request for STI testing.  Patient is nontoxic, in no acute distress, vitals notable for initial tachycardia resolved on my exam.   Additional history obtained:  Additional history obtained from chart review & nursing note review.   Lab Tests:  I Ordered, reviewed, and interpreted labs, which included:  UA: No UTI COVID/flu: Pending.  GC/chlamydia/RPR/HIV: Pending.   ED Course:  Exam is without signs of AOM, AOE, or mastoiditis. Oropharyngeal exam is benign.. No sinus tenderness. No  meningeal signs. Lungs are CTA without focal adventitious sounds, no signs of increased work of breathing, , doubt CAP. Abdomen nontender w/o peritoneal signs- do not suspect acute surgical process. No rashes. No history components to raise concern for orchitis, epididymitis, prostatitis, additionally urinalysis is unremarkable.. Overall suspect viral. COVID/influenza testing obtained & pending.  STI testing also obtained and pending.  Recommended supportive care. I discussed results, treatment plan, need for follow-up, and return precautions with the patient.. Provided opportunity for questions, patient confirmed understanding and is in agreement with plan.    Portions of this note were generated with Scientist, clinical (histocompatibility and immunogenetics). Dictation errors may occur despite best attempts at proofreading.   Final Clinical Impression(s) / ED Diagnoses Final diagnoses:  Screen for STD (sexually transmitted disease)  Fever, unspecified fever cause    Rx / DC Orders ED Discharge Orders    None       Cherly Anderson, PA-C 10/31/20 0103    Molpus, Jonny Ruiz, MD 10/31/20 [phone]

## 2020-10-31 NOTE — ED Notes (Signed)
Discharged no concerns at this time.  °

## 2020-11-01 ENCOUNTER — Telehealth: Payer: Self-pay

## 2020-11-01 NOTE — Telephone Encounter (Signed)
Transition Care Management Unsuccessful Follow-up Telephone Call  Date of discharge and from where:  10/31/2020 from [facility]  Attempts:  1st Attempt  Reason for unsuccessful TCM follow-up call:  Unable to leave message

## 2020-11-02 NOTE — Telephone Encounter (Signed)
Transition Care Management Unsuccessful Follow-up Telephone Call  Date of discharge and from where:  10/31/2020 from [facility]  Attempts:  2nd Attempt  Reason for unsuccessful TCM follow-up call:  Unable to leave message

## 2020-11-03 NOTE — Telephone Encounter (Signed)
Transition Care Management Unsuccessful Follow-up Telephone Call  Date of discharge and from where:  10/31/2020 from [facility]  Attempts:  3rd Attempt  Reason for unsuccessful TCM follow-up call:  Unable to reach patient

## 2020-12-26 ENCOUNTER — Ambulatory Visit: Payer: Medicaid Other | Admitting: Nurse Practitioner

## 2021-01-13 ENCOUNTER — Other Ambulatory Visit: Payer: Self-pay

## 2021-01-13 ENCOUNTER — Telehealth (INDEPENDENT_AMBULATORY_CARE_PROVIDER_SITE_OTHER): Payer: Medicaid Other | Admitting: Nurse Practitioner

## 2021-01-13 ENCOUNTER — Encounter: Payer: Self-pay | Admitting: Nurse Practitioner

## 2021-01-13 DIAGNOSIS — Z113 Encounter for screening for infections with a predominantly sexual mode of transmission: Secondary | ICD-10-CM | POA: Diagnosis not present

## 2021-01-13 NOTE — Progress Notes (Signed)
   [facility] [address] [place], [place]  [address] Phone:  [phone]   Fax:  [phone] Virtual Visit via Video Note  I connected with Dwayne Sandoval on 01/13/21 at  3:00 PM EDT by video and verified that I am speaking with the correct person using two identifiers.   I discussed the limitations, risks, security and privacy concerns of performing an evaluation and management service by video and the availability of in person appointments. I also discussed with the patient that there may be a patient responsible charge related to this service. The patient expressed understanding and agreed to proceed.  Patient home Provider Office  History of Present Illness:  He  has a past medical history of Gonorrhea and [name] syndrome (HCC).  He is seen today for a virtual visit for 37-month follow-up. Denies headache, dizziness, visual changes, shortness of breath, dyspnea on exertion, chest pain, nausea, vomiting or any edema.  However would like STD screenings. Denies oral lesions,, sore throat, pelvic pain, penile discharge, dysuria, nocturia any visible ulcers or lesions  Observations/Objective: Virtual visit virtual visit no exam.  Patient in no visible distress  Assessment and Plan: Assessment  Primary Diagnosis & Pertinent Problem List: The encounter diagnosis was Screening for STD (sexually transmitted disease).  Visit Diagnosis: 1. Screening for STD (sexually transmitted disease)    Follow Up Instructions: Lab only in 1 week 62-month follow-up   I discussed the assessment and treatment plan with the patient. The patient was provided an opportunity to ask questions and all were answered. The patient agreed with the plan and demonstrated an understanding of the instructions.   The patient was advised to call back or seek an in-person evaluation if the symptoms worsen or if the condition fails to improve as anticipated.  I provided 5 minutes of video-  visit time during this encounter.   Barbette Merino, NP

## 2021-01-20 ENCOUNTER — Other Ambulatory Visit: Payer: Medicaid Other

## 2021-01-20 ENCOUNTER — Other Ambulatory Visit: Payer: Self-pay

## 2021-01-20 DIAGNOSIS — Z113 Encounter for screening for infections with a predominantly sexual mode of transmission: Secondary | ICD-10-CM

## 2021-01-21 LAB — HEPATITIS C ANTIBODY: Hep C Virus Ab: <0.1 s/co ratio (ref 0.0–0.9)

## 2021-01-21 LAB — HSV(HERPES SIMPLEX VRS) I + II AB-IGG
HSV 1 Glycoprotein G Ab, IgG: <0.91 index (ref 0.00–0.90)
HSV 2 IgG, Type Spec: <0.91 index (ref 0.00–0.90)

## 2021-01-21 LAB — HIV ANTIBODY (ROUTINE TESTING W REFLEX): HIV Screen 4th Generation wRfx: NONREACTIVE

## 2021-01-21 LAB — RPR: RPR Ser Ql: NONREACTIVE

## 2021-01-24 LAB — CHLAMYDIA/GONOCOCCUS/TRICHOMONAS, NAA
Chlamydia by NAA: NEGATIVE
Gonococcus by NAA: NEGATIVE
Trich vag by NAA: NEGATIVE

## 2021-03-05 ENCOUNTER — Encounter (HOSPITAL_COMMUNITY): Payer: Self-pay | Admitting: *Deleted

## 2021-03-05 ENCOUNTER — Ambulatory Visit (HOSPITAL_COMMUNITY)
Admission: EM | Admit: 2021-03-05 | Discharge: 2021-03-05 | Disposition: A | Discharge status: Home / Self Care | Payer: Medicaid Other | Attending: Emergency Medicine | Admitting: Emergency Medicine

## 2021-03-05 ENCOUNTER — Other Ambulatory Visit: Payer: Self-pay

## 2021-03-05 DIAGNOSIS — J392 Other diseases of pharynx: Secondary | ICD-10-CM

## 2021-03-05 MED ORDER — LIDOCAINE VISCOUS HCL 2 % MT SOLN: 15.0000 mL | OROMUCOSAL | 0 refills | Status: DC | PRN

## 2021-03-05 MED ORDER — GUAIFENESIN ER 600 MG PO TB12: 600.0000 mg | ORAL_TABLET | Freq: Two times a day (BID) | ORAL | 0 refills | Status: DC

## 2021-03-05 NOTE — ED Provider Notes (Signed)
[facility]    CSN: [id number] Arrival date & time: 03/05/21  1355      History   Chief Complaint Chief Complaint  Patient presents with   Pt reports mucous in throat.    HPI Dwayne Sandoval is a 25 y.o. male.   Patient presents with throat irritation, describes as feeling like there is mucus sitting in the throat.  Unable to cough up.  Denies fever chills, body aches, nasal congestion, rhinorrhea, sore throat, ear pain or fullness, headaches, chest pain, shortness of breath, wheezing, abdominal pain, nausea, vomiting, diarrhea.  No known sick contacts.  Works at a [employer].  Not currently sexually active, last encounter April.  Current every day smoker of Black and milds.  Use of naproxen which did provide relief.  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 No pertinent surgical history.     Home Medications    Prior to Admission medications   Medication Sig Start Date End Date Taking? Authorizing Provider  guaiFENesin (MUCINEX) 600 MG 12 hr tablet Take 1 tablet (600 mg total) by mouth 2 (two) times daily. 03/05/21  Yes Antoni Stefan R, NP  lidocaine (XYLOCAINE) 2 % solution Use as directed 15 mLs in the mouth or throat as needed for mouth pain. 03/05/21  Yes Rashee Marschall, Elita Boone, NP    Family History Family History  Problem Relation Age of Onset   Diabetes Mother    Healthy Father     Social History Social History   Tobacco Use   Smoking status: Passive Smoke Exposure - Never Smoker   Smokeless tobacco: Never  Vaping Use   Vaping Use: Never used  Substance Use Topics   Alcohol use: Yes    Comment: occ   Drug use: Never     Allergies   Patient has no known allergies.   Review of  Systems Review of Systems Defer to HPI    Physical Exam Triage Vital Signs ED Triage Vitals  Enc Vitals Group     BP 03/05/21 1457 118/67     Pulse Rate 03/05/21 1457 (!) 58     Resp 03/05/21 1457 18     Temp 03/05/21 1457 (!) 97.3 F (36.3 C)     Temp src --      SpO2 03/05/21 1457 100 %     Weight --      Height --      Head Circumference --      Peak Flow --      Pain Score 03/05/21 1454 0     Pain Loc --      Pain Edu? --      Excl. in GC? --    No data found.  Updated Vital Signs BP 118/67   Pulse (!) 58   Temp (!) 97.3 F (36.3 C)   Resp 18   SpO2 100%   Visual Acuity Right Eye Distance:   Left Eye Distance:   Bilateral Distance:    Right Eye Near:   Left Eye Near:    Bilateral Near:     Physical Exam Constitutional:      Appearance: Normal appearance. He is normal weight.  HENT:  Head: Normocephalic.     Right Ear: Tympanic membrane, ear canal and external ear normal.     Left Ear: Tympanic membrane, ear canal and external ear normal.     Nose: Nose normal.     Mouth/Throat:     Mouth: Mucous membranes are moist.     Pharynx: Oropharynx is clear. No oropharyngeal exudate or posterior oropharyngeal erythema.  Eyes:     Extraocular Movements: Extraocular movements intact.  Cardiovascular:     Rate and Rhythm: Normal rate and regular rhythm.     Pulses: Normal pulses.     Heart sounds: Normal heart sounds.  Pulmonary:     Effort: Pulmonary effort is normal.     Breath sounds: Normal breath sounds.  Musculoskeletal:     Cervical back: Normal range of motion and neck supple. No tenderness.  Lymphadenopathy:     Cervical: No cervical adenopathy.  Skin:    General: Skin is warm.  Neurological:     Mental Status: He is alert and oriented to person, place, and time. Mental status is at baseline.  Psychiatric:        Mood and Affect: Mood normal.        Behavior: Behavior normal.     UC Treatments / Results  Labs (all labs ordered are  listed, but only abnormal results are displayed) Labs Reviewed - No data to display  EKG   Radiology No results found.  Procedures Procedures (including critical care time)  Medications Ordered in UC Medications - No data to display  Initial Impression / Assessment and Plan / UC Course  I have reviewed the triage vital signs and the nursing notes.  Pertinent labs & imaging results that were available during my care of the patient were reviewed by me and considered in my medical decision making (see chart for details).  Throat irritation  Symptom most likely caused by congestion, etiology either viral or related to smoking, discussed with patient, will conservatively treat for both  1.  Mucinex 600 mg twice a day. 2.  Lidocaine viscous 2% 15 mils every 4 hours as needed for comfort 3.  May continue use of naproxen if helpful 4.  Advised patient to cease smoking until symptoms resolve or completely as possible 5.  Return precautions given for signs of infection, difficulty swallowing or throat swelling for follow-up in urgent care Final Clinical Impressions(s) / UC Diagnoses   Final diagnoses:  Throat irritation     Discharge Instructions      Your symptoms are most likely coming from a virus or irritation from smoking  Please refrain from smoking until symptoms resolve  You may use of Mucinex twice a day as needed to help with possible congestion that is irritating throat  Also use lidocaine solution every 4 hours to help with irritation, be mindful that this medicine will numb everything that it touches be careful eating after use  You can continue use of naproxen if it provides you comfort  At any point if your symptoms worsen, you have difficulty swallowing you feel like your throat swelling you may follow-up as soon as possible     ED Prescriptions     Medication Sig Dispense Auth. Provider   lidocaine (XYLOCAINE) 2 % solution Use as directed 15 mLs in the  mouth or throat as needed for mouth pain. 100 mL Shakiera Edelson R, NP   guaiFENesin (MUCINEX) 600 MG 12 hr tablet Take 1 tablet (600 mg total) by mouth 2 (two)  times daily. 20 tablet Valinda Hoar, NP      PDMP not reviewed this encounter.   Valinda Hoar, NP 03/05/21 1540

## 2021-03-05 NOTE — ED Triage Notes (Signed)
Pt reports her throat feels like there is a lot of mucous in it.

## 2021-03-05 NOTE — Discharge Instructions (Addendum)
Your symptoms are most likely coming from a virus or irritation from smoking  Please refrain from smoking until symptoms resolve  You may use of Mucinex twice a day as needed to help with possible congestion that is irritating throat  Also use lidocaine solution every 4 hours to help with irritation, be mindful that this medicine will numb everything that it touches be careful eating after use  You can continue use of naproxen if it provides you comfort  At any point if your symptoms worsen, you have difficulty swallowing you feel like your throat swelling you may follow-up as soon as possible

## 2021-03-31 ENCOUNTER — Encounter (HOSPITAL_COMMUNITY): Payer: Self-pay

## 2021-03-31 ENCOUNTER — Encounter (HOSPITAL_BASED_OUTPATIENT_CLINIC_OR_DEPARTMENT_OTHER): Payer: Self-pay | Admitting: Emergency Medicine

## 2021-03-31 ENCOUNTER — Other Ambulatory Visit: Payer: Self-pay

## 2021-03-31 ENCOUNTER — Emergency Department (HOSPITAL_COMMUNITY)
Admission: EM | Admit: 2021-03-31 | Discharge: 2021-03-31 | Disposition: A | Discharge status: Home / Self Care | Payer: Medicaid Other | Attending: Emergency Medicine | Admitting: Emergency Medicine

## 2021-03-31 ENCOUNTER — Emergency Department (HOSPITAL_BASED_OUTPATIENT_CLINIC_OR_DEPARTMENT_OTHER)
Admission: EM | Admit: 2021-03-31 | Discharge: 2021-03-31 | Disposition: A | Discharge status: Home / Self Care | Payer: Medicaid Other | Attending: Emergency Medicine | Admitting: Emergency Medicine

## 2021-03-31 DIAGNOSIS — Z7722 Contact with and (suspected) exposure to environmental tobacco smoke (acute) (chronic): Secondary | ICD-10-CM | POA: Diagnosis not present

## 2021-03-31 DIAGNOSIS — Z5321 Procedure and treatment not carried out due to patient leaving prior to being seen by health care provider: Secondary | ICD-10-CM | POA: Insufficient documentation

## 2021-03-31 DIAGNOSIS — R21 Rash and other nonspecific skin eruption: Secondary | ICD-10-CM | POA: Diagnosis not present

## 2021-03-31 DIAGNOSIS — A510 Primary genital syphilis: Secondary | ICD-10-CM | POA: Insufficient documentation

## 2021-03-31 DIAGNOSIS — N4889 Other specified disorders of penis: Secondary | ICD-10-CM | POA: Diagnosis present

## 2021-03-31 DIAGNOSIS — A64 Unspecified sexually transmitted disease: Secondary | ICD-10-CM | POA: Insufficient documentation

## 2021-03-31 LAB — HIV ANTIBODY (ROUTINE TESTING W REFLEX): HIV Screen 4th Generation wRfx: NONREACTIVE

## 2021-03-31 MED ORDER — PENICILLIN G BENZATHINE 1200000 UNIT/2ML IM SUSY
2.4000 10*6.[IU] | PREFILLED_SYRINGE | Freq: Once | INTRAMUSCULAR | Status: AC
  Administered 2021-03-31: 2.4 10*6.[IU] via INTRAMUSCULAR
  Filled 2021-03-31: qty 4

## 2021-03-31 NOTE — ED Provider Notes (Signed)
[facility] Provider Note: [name], MD, FACEP  CSN: [id number] MRN: [id number] ARRIVAL: 03/31/21 at 0500 ROOM: DB015/DB015   CHIEF COMPLAINT  Rash   HISTORY OF PRESENT ILLNESS  03/31/21 5:15 AM Dwayne Sandoval is a 25 y.o. male who is a normally receptive homosexual.  About 2 weeks ago he tried insertive intercourse for the first time during a threesome.  About 3 days ago he noticed a firm, painless sore on the shaft of his penis.  There is no itchiness or drainage.  He denies dysuria or discharge.  He gets routine STD testing but this occurred since his last checkup.   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 No pertinent surgical history.  Family History  Problem Relation Age of Onset   Diabetes Mother    Healthy Father     Social History   Tobacco Use   Smoking status: Passive Smoke Exposure - Never Smoker   Smokeless tobacco: Never  Vaping Use   Vaping Use: Never used  Substance Use Topics   Alcohol use: Yes    Comment: occ   Drug use: Never    Prior to Admission medications   Medication Sig Start Date End Date Taking? Authorizing Provider  guaiFENesin (MUCINEX) 600 MG 12 hr tablet Take 1 tablet (600 mg total) by mouth 2 (two) times daily. 03/05/21   White, Elita Boone, NP  lidocaine (XYLOCAINE) 2 % solution Use as directed 15 mLs in the mouth or throat as needed for mouth pain. 03/05/21   Valinda Hoar, NP    Allergies Patient has no known allergies.   REVIEW OF SYSTEMS  Negative except as noted here or in the History of Present Illness.   PHYSICAL EXAMINATION  Initial Vital Signs Blood pressure (!) 136/91, pulse 67, temperature 98.9 F (37.2 C), temperature source Oral, resp. rate 18, SpO2 100 %.  Examination General: Well-developed, well-nourished male in no acute distress; appearance consistent with age of record HENT: normocephalic; atraumatic Eyes: Normal  appearance Neck: supple Heart: regular rate and rhythm Lungs: clear to auscultation bilaterally Abdomen: soft; nondistended; nontender; bowel sounds present GU: Tanner V male, circumcised; no urethral discharge; indurated, nontender sore on penile shaft consistent with chancre:    Extremities: No deformity; full range of motion Neurologic: Awake, alert and oriented; motor function intact in all extremities and symmetric; no facial droop Skin: Warm and dry Psychiatric: Normal mood and affect   RESULTS  Summary of this visit's results, reviewed and interpreted by myself:   EKG Interpretation  Date/Time:    Ventricular Rate:    PR Interval:    QRS Duration:   QT Interval:    QTC Calculation:   R Axis:     Text Interpretation:         Laboratory Studies: No results found for this or any previous visit (from the past 24 hour(s)). Imaging Studies: No results found.  ED COURSE and MDM  Nursing notes, initial and subsequent vitals signs, including pulse oximetry, reviewed and interpreted by myself.  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  HIV and GC/chlamydia testing also pending.  Will  refer to infectious diseases for follow-up.   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

## 2021-03-31 NOTE — ED Triage Notes (Signed)
Pt arrived via POV, c/o concern for monkey pox, states noticed lump on penis a couple days ago. No known exposure. No discharge, or dysuria.

## 2021-03-31 NOTE — ED Triage Notes (Signed)
Pt presents to ED POV. Pt c/o bump on penis. Pt reports that he noticed it a couple of days ago. Pt denies itchiness, denies pain.

## 2021-04-01 ENCOUNTER — Telehealth: Payer: Self-pay

## 2021-04-01 LAB — RPR: RPR Ser Ql: NONREACTIVE

## 2021-04-01 NOTE — Telephone Encounter (Signed)
Transition Care Management Follow-up Telephone Call Date of discharge and from where: 03/31/2021 from [facility] How have you been since you were released from the hospital? Pt stated that he is feeling well. Pt stated that there is some injection site soreness and I informed that it is to be expected.  Any questions or concerns? No  Items Reviewed: Did the pt receive and understand the discharge instructions provided? Yes  Medications obtained and verified? Yes  Other? No  Any new allergies since your discharge? No  Dietary orders reviewed? No Do you have support at home? Yes   Functional Questionnaire: (I = Independent and D = Dependent) ADLs: I  Bathing/Dressing- I  Meal Prep- I  Eating- I  Maintaining continence- I  Transferring/Ambulation- I  Managing Meds- I   Follow up appointments reviewed:  PCP Hospital f/u appt confirmed? No   Specialist Hospital f/u appt confirmed? No   Are transportation arrangements needed? No  If their condition worsens, is the pt aware to call PCP or go to the Emergency Dept.? Yes Was the patient provided with contact information for the PCP's office or ED? Yes Was to pt encouraged to call back with questions or concerns? Yes

## 2021-04-03 LAB — GC/CHLAMYDIA PROBE AMP (~~LOC~~) NOT AT ARMC
Chlamydia: NEGATIVE
Comment: NEGATIVE
Comment: NORMAL
Neisseria Gonorrhea: NEGATIVE

## 2021-04-05 ENCOUNTER — Encounter (HOSPITAL_BASED_OUTPATIENT_CLINIC_OR_DEPARTMENT_OTHER): Payer: Self-pay

## 2021-04-05 ENCOUNTER — Emergency Department (HOSPITAL_BASED_OUTPATIENT_CLINIC_OR_DEPARTMENT_OTHER): Payer: Medicaid Other

## 2021-04-05 ENCOUNTER — Emergency Department (HOSPITAL_BASED_OUTPATIENT_CLINIC_OR_DEPARTMENT_OTHER)
Admission: EM | Admit: 2021-04-05 | Discharge: 2021-04-05 | Disposition: A | Discharge status: Home / Self Care | Payer: Medicaid Other | Attending: Emergency Medicine | Admitting: Emergency Medicine

## 2021-04-05 ENCOUNTER — Other Ambulatory Visit: Payer: Self-pay

## 2021-04-05 DIAGNOSIS — A539 Syphilis, unspecified: Secondary | ICD-10-CM

## 2021-04-05 DIAGNOSIS — N4889 Other specified disorders of penis: Secondary | ICD-10-CM | POA: Insufficient documentation

## 2021-04-05 DIAGNOSIS — R519 Headache, unspecified: Secondary | ICD-10-CM | POA: Diagnosis not present

## 2021-04-05 DIAGNOSIS — Z8616 Personal history of COVID-19: Secondary | ICD-10-CM | POA: Diagnosis not present

## 2021-04-05 DIAGNOSIS — Z20822 Contact with and (suspected) exposure to covid-19: Secondary | ICD-10-CM | POA: Diagnosis not present

## 2021-04-05 LAB — CBC WITH DIFFERENTIAL/PLATELET
Abs Immature Granulocytes: 0.02 10*3/uL (ref 0.00–0.07)
Basophils Absolute: 0.1 10*3/uL (ref 0.0–0.1)
Basophils Relative: 1 %
Eosinophils Absolute: 0.4 10*3/uL (ref 0.0–0.5)
Eosinophils Relative: 5 %
HCT: 44.3 % (ref 39.0–52.0)
Hemoglobin: 15.2 g/dL (ref 13.0–17.0)
Immature Granulocytes: 0 %
Lymphocytes Relative: 44 %
Lymphs Abs: 3.1 10*3/uL (ref 0.7–4.0)
MCH: 29.3 pg (ref 26.0–34.0)
MCHC: 34.3 g/dL (ref 30.0–36.0)
MCV: 85.4 fL (ref 80.0–100.0)
Monocytes Absolute: 0.3 10*3/uL (ref 0.1–1.0)
Monocytes Relative: 5 %
Neutro Abs: 3.1 10*3/uL (ref 1.7–7.7)
Neutrophils Relative %: 45 %
Platelets: 316 10*3/uL (ref 150–400)
RBC: 5.19 MIL/uL (ref 4.22–5.81)
RDW: 12 % (ref 11.5–15.5)
WBC: 7 10*3/uL (ref 4.0–10.5)
nRBC: 0 % (ref 0.0–0.2)

## 2021-04-05 LAB — COMPREHENSIVE METABOLIC PANEL
ALT: 10 U/L (ref 0–44)
AST: 14 U/L — ABNORMAL LOW (ref 15–41)
Albumin: 4.2 g/dL (ref 3.5–5.0)
Alkaline Phosphatase: 84 U/L (ref 38–126)
Anion gap: 8 (ref 5–15)
BUN: 13 mg/dL (ref 6–20)
CO2: 27 mmol/L (ref 22–32)
Calcium: 9.2 mg/dL (ref 8.9–10.3)
Chloride: 104 mmol/L (ref 98–111)
Creatinine, Ser: 0.92 mg/dL (ref 0.61–1.24)
GFR, Estimated: >60 mL/min (ref >60)
Glucose, Bld: 81 mg/dL (ref 70–99)
Potassium: 3.9 mmol/L (ref 3.5–5.1)
Sodium: 139 mmol/L (ref 135–145)
Total Bilirubin: 0.8 mg/dL (ref 0.3–1.2)
Total Protein: 7.7 g/dL (ref 6.5–8.1)

## 2021-04-05 LAB — CSF CELL COUNT WITH DIFFERENTIAL
RBC Count, CSF: 0 /mm3
RBC Count, CSF: 0 /mm3
Tube #: 1
Tube #: 4
WBC, CSF: 0 /mm3 (ref 0–5)
WBC, CSF: 3 /mm3 (ref 0–5)

## 2021-04-05 LAB — CRYPTOCOCCAL ANTIGEN, CSF: Crypto Ag: NEGATIVE

## 2021-04-05 LAB — PROTEIN, CSF: Total  Protein, CSF: 27 mg/dL (ref 15–45)

## 2021-04-05 LAB — SARS CORONAVIRUS 2 (TAT 6-24 HRS): SARS Coronavirus 2: NEGATIVE

## 2021-04-05 LAB — GLUCOSE, CSF: Glucose, CSF: 63 mg/dL (ref 40–70)

## 2021-04-05 MED ORDER — LIDOCAINE-EPINEPHRINE 2 %-1:100000 IJ SOLN
10.0000 mL | Freq: Once | INTRAMUSCULAR | Status: AC
  Administered 2021-04-05: 10 mL via INTRADERMAL

## 2021-04-05 NOTE — ED Provider Notes (Signed)
[facility] EMERGENCY DEPT Provider Note  CSN: [id number] Arrival date & time: 04/05/21 0046  Chief Complaint(s) Headache and Penile Sore  HPI Dwayne Sandoval is a 25 y.o. 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 Authorizing Provider  guaiFENesin (MUCINEX) 600 MG 12 hr tablet Take 1 tablet (600 mg total) by mouth 2 (two) times daily. 03/05/21   White, Elita Boone, NP  lidocaine (XYLOCAINE) 2 % solution Use as directed 15 mLs in the mouth or throat as needed for mouth pain. 03/05/21   Valinda Hoar, NP                                                                                                                                    Past Surgical History History reviewed. No pertinent surgical history. Family History Family History  Problem Relation Age of Onset   Diabetes Mother    Healthy Father     Social History Social History   Tobacco  Use   Smoking status: Never    Passive exposure: Yes   Smokeless tobacco: Never  Vaping Use   Vaping Use: Never used  Substance Use Topics   Alcohol use: Yes    Comment: occ   Drug use: Not Currently    Types: Marijuana    Comment: Occasionally   Allergies Patient has no known allergies.  Review of Systems Review of Systems  Constitutional:  Negative for fatigue and fever.  HENT:  Negative for congestion and sinus pressure.   Respiratory:  Negative for cough.   Gastrointestinal:  Negative for nausea and vomiting.  Musculoskeletal:  Negative for myalgias, neck pain and neck stiffness.  Neurological:  Positive for headaches. Negative for dizziness, weakness, numbness, paresthesias and loss of balance.  All other systems are reviewed and are negative for acute change except as noted in the HPI  Physical Exam Vital Signs  I have reviewed the triage vital signs BP 111/70   Pulse 78   Temp 99.3 F (37.4 C) (Oral)   Resp 16   Ht 5\' 11"  (1.803 m)   Wt 75.8 kg   SpO2 100%   BMI 23.29 kg/m   Physical Exam Vitals reviewed.  Constitutional:      General: He is not in acute distress.    Appearance: He is well-developed. He is not diaphoretic.  HENT:     Head: Normocephalic and atraumatic.     Right Ear: External ear normal.     Left Ear: External ear normal.     Nose: Nose normal.     Mouth/Throat:     Mouth: Mucous membranes are moist.  Eyes:     General: No scleral icterus.    Conjunctiva/sclera: Conjunctivae normal.  Neck:     Trachea: Phonation normal.  Cardiovascular:     Rate and Rhythm: Normal rate and regular rhythm.  Pulmonary:     Effort: Pulmonary effort is normal. No respiratory distress.     Breath sounds: No stridor.  Abdominal:     General: There is no distension.  Musculoskeletal:        General: Normal range of motion.     Cervical back: Normal range of motion.  Skin:    Comments: Penile ulcers coalescing. Left inguinal LAD, nontender Isolated  papules with central ulceration to back and chest.  Isolated papules to BUE including right palm.  Neurological:     Mental Status: He is alert and oriented to person, place, and time.     Comments: Mental Status:  Alert and oriented to person, place, and time.  Attention and concentration normal.  Speech clear.  Recent memory is intact  Cranial Nerves:  II Visual Fields: Intact to confrontation. Visual fields intact. III, IV, VI: Pupils equal and reactive to light and near. Full eye movement without nystagmus  V Facial Sensation: Normal. No weakness of masticatory muscles  VII: No facial weakness or asymmetry  VIII Auditory Acuity: Grossly normal  IX/X: The uvula is midline; the palate elevates symmetrically  XI: Normal sternocleidomastoid and trapezius strength  XII: The tongue is midline. No atrophy or fasciculations.   Motor System: Muscle Strength: 5/5 and symmetric in the upper and lower extremities. No pronation or drift.  Muscle Tone: Tone and muscle bulk are normal in the upper and lower extremities.  Coordination: No tremor.  Sensation: Intact to light touch. Gait: Routine gait normal.   Psychiatric:        Behavior: Behavior normal.    ED Results and Treatments Labs (all labs ordered are listed, but only abnormal results are displayed) Labs Reviewed  COMPREHENSIVE METABOLIC PANEL - Abnormal; Notable for the following components:      Result Value   AST 14 (*)    All other components within normal limits  CSF CULTURE W GRAM STAIN  GRAM STAIN  MONKEYPOX VIRUS DNA, QUALITATIVE REAL-TIME PCR  SARS CORONAVIRUS 2 (TAT 6-24 HRS)  CBC WITH DIFFERENTIAL/PLATELET  GLUCOSE, CSF  PROTEIN, CSF  CSF CELL COUNT WITH DIFFERENTIAL  CSF CELL COUNT WITH DIFFERENTIAL  VDRL, CSF  CRYPTOCOCCAL ANTIGEN, CSF  HSV 1/2 PCR, CSF  EKG  EKG  Interpretation  Date/Time:    Ventricular Rate:    PR Interval:    QRS Duration:   QT Interval:    QTC Calculation:   R Axis:     Text Interpretation:         Radiology CT HEAD WO CONTRAST ( )  Result Date: 04/05/2021 CLINICAL DATA:  25 year old male with headache, possible syphilis. EXAM: CT HEAD WITHOUT CONTRAST TECHNIQUE: Contiguous axial images were obtained from the base of the skull through the vertex without intravenous contrast. COMPARISON:  Brain MRI 12/13/2006. head CT 09/15/2007. FINDINGS: Brain: Normal cerebral volume. No midline shift, ventriculomegaly, mass effect, evidence of mass lesion, intracranial hemorrhage or evidence of cortically based acute infarction. Gray-white matter differentiation is within normal limits throughout the brain. Vascular: No suspicious intracranial vascular hyperdensity. Skull: Negative. Sinuses/Orbits: Visualized paranasal sinuses and mastoids are clear. Other: Leftward gaze, but otherwise negative orbit and scalp soft tissues. IMPRESSION: Normal noncontrast Head CT. Electronically Signed   By: Odessa Fleming M.D.   On: 04/05/2021 04:41    Pertinent labs & imaging results that were available during my care of the patient were reviewed by me and considered in my medical decision making (see MDM for details).  Medications Ordered in ED Medications  lidocaine-EPINEPHrine (XYLOCAINE W/EPI) 2 %-1:100000 (with pres) injection 10 mL (10 mLs Intradermal Given by Other 04/05/21 0556)                                                                                                                                     Procedures .Lumbar Puncture  Date/Time: 04/05/2021 5:58 AM Performed by: Nira Conn, MD Authorized by: Nira Conn, MD   Consent:    Consent obtained:  Verbal   Consent given by:  Patient   Risks discussed:  Bleeding, infection, headache and nerve damage   Alternatives discussed:  Delayed treatment Universal protocol:     Imaging studies available: yes     Site/side marked: yes     Patient identity confirmed:  Arm band Pre-procedure details:    Procedure purpose:  Diagnostic   Preparation: Patient was prepped and draped in usual sterile fashion   Anesthesia:    Anesthesia method:  Local infiltration   Local anesthetic:  Lidocaine 1% w/o epi Procedure details:    Lumbar space:  L4-L5 interspace   Patient position:  L lateral decubitus   Needle gauge:  18   Needle type:  Spinal needle - Quincke tip   Needle length (in):  3.5   Number of attempts:  1   Opening pressure (cm H2O):  19   Fluid appearance:  Clear   Tubes of fluid:  4   Total volume (ml):  6 Post-procedure details:    Puncture site:  Adhesive bandage applied   Procedure completion:  Tolerated well, no immediate complications  (including critical care time)  Medical Decision Making / ED Course  I have reviewed the nursing notes for this encounter and the patient's prior records (if available in EHR or on provided paperwork).  Adebayo Ensminger was evaluated in Emergency Department on 04/05/2021 for the symptoms described in the history of present illness. He was evaluated in the context of the global COVID-19 pandemic, which necessitated consideration that the patient might be at risk for infection with the SARS-CoV-2 virus that causes COVID-19. Institutional protocols and algorithms that pertain to the evaluation of patients at risk for COVID-19 are in a state of rapid change based on information released by regulatory bodies including the CDC and federal and state organizations. These policies and algorithms were followed during the patient's care in the ED.  Clinical Course as of 04/05/21 0730  Wed Apr 05, 2021  0345 Known syphilis. Treated with bicillin. Lesions spreading - possible secondary syphilis. Also considering pox virus (Monkeypox)  Now with headache. No other neuro symptoms. Will need to rule neurosyphilis     [PC]  0456  Consulted ID and spoke with Dr. Gershon Mussel.  He recommended that if the CSF is reassuring with no leukocytosis or elevated protein, that the patient can follow-up in the infectious disease clinic given his mild symptoms of headache without any other neurologic deficit or symptoms.  [PC]  0601 After LP, headache resolved without other intervention. [PC]    Clinical Course User Index [PC] Nealie Mchatton, Amadeo Garnet, MD     Pertinent labs & imaging results that were available during my care of the patient were reviewed by me and considered in my medical decision making:  Patient care turned over to Dr Lynelle Doctor. Patient case and results discussed in detail; please see their note for further ED managment.     Final Clinical Impression(s) / ED Diagnoses Final diagnoses:  None     This chart was dictated using voice recognition software.  Despite best efforts to proofread,  errors can occur which can change the documentation meaning.    Nira Conn, MD 04/07/21 2209

## 2021-04-05 NOTE — Discharge Instructions (Addendum)
Your ulcerations and lesions will likely take 4 to 6 weeks to resolve.  They will likely spread before they began to improve.  If you develop a fever with worsening headache, eye pain, nausea vomiting, persistent numbness/tingling or weakness in the lower extremities, difficulty walking, or incontinence please return to the emergency department.

## 2021-04-05 NOTE — ED Notes (Signed)
Patient verbalizes understanding of discharge instructions. Opportunity for questioning and answers were provided. Patient discharged from ED.  °

## 2021-04-05 NOTE — ED Provider Notes (Signed)
Pt seen by [facility], please see his note.  Plan is to follow up on LP results.  If cell count, protein normal.  Ok for outpatient management. Protein level is normal range  Preliminary CSF results do not suggest meningitis.  Patient is stable for discharge with outpatient infectious disease follow-up   Linwood Dibbles, MD 04/05/21 1250

## 2021-04-05 NOTE — ED Triage Notes (Signed)
Patient here POV from Home with Headache and Penile Sore.  Patient states he was here a few days PTA for a Penicillin Shot due to possible Syphilis Infection. Patient states he is now having a Headache and states his Sore is worsening.   No Fevers, NAD Noted during Triage. A&Ox4. GCS 15.

## 2021-04-06 ENCOUNTER — Telehealth: Payer: Self-pay

## 2021-04-06 LAB — MONKEYPOX VIRUS DNA, QUALITATIVE REAL-TIME PCR: Orthopoxvirus DNA, QL PCR: DETECTED — AB

## 2021-04-06 LAB — VDRL, CSF: VDRL Quant, CSF: NONREACTIVE

## 2021-04-06 NOTE — Telephone Encounter (Signed)
Transition Care Management Follow-up Telephone Call Date of discharge and from where: 04/05/2021-[facility] How have you been since you were released from the hospital? Patient stated he is doing ok. Any questions or concerns? No  Items Reviewed: Did the pt receive and understand the discharge instructions provided? Yes  Medications obtained and verified?  No medications sent to pharmacy Other? No  Any new allergies since your discharge? No  Dietary orders reviewed? N/A Do you have support at home? Yes   Home Care and Equipment/Supplies: Were home health services ordered? not applicable If so, what is the name of the agency? N/A  Has the agency set up a time to come to the patient's home? not applicable Were any new equipment or medical supplies ordered?  No What is the name of the medical supply agency? N/A Were you able to get the supplies/equipment? not applicable Do you have any questions related to the use of the equipment or supplies? No  Functional Questionnaire: (I = Independent and D = Dependent) ADLs: I  Bathing/Dressing- I  Meal Prep- I  Eating- I  Maintaining continence- I  Transferring/Ambulation- I  Managing Meds- I  Follow up appointments reviewed:  PCP Hospital f/u appt confirmed? Yes  Scheduled to see Crystal King,NP on 04/17/2021 @ 1:05pm  Specialist Hospital f/u appt confirmed? Yes  Scheduled to see Dr. Elinor Parkinson on 04/12/2021 @ 2pm. Are transportation arrangements needed? No  If their condition worsens, is the pt aware to call PCP or go to the Emergency Dept.? Yes Was the patient provided with contact information for the PCP's office or ED? Yes Was to pt encouraged to call back with questions or concerns? Yes

## 2021-04-06 NOTE — Telephone Encounter (Signed)
Spoke with patient on the phone.  He is concern about possible monkeypox vs. Syphilis. Patient was informed that he could have a coinfection.  Labs results are pending.  He already has an apt with ID. Will check in with patient later for reevaluation.  Questions asked and answered.

## 2021-04-07 LAB — HSV 1/2 PCR, CSF
HSV-1 DNA: NEGATIVE
HSV-2 DNA: NEGATIVE

## 2021-04-08 LAB — CSF CULTURE W GRAM STAIN: Culture: NO GROWTH

## 2021-04-11 ENCOUNTER — Other Ambulatory Visit (HOSPITAL_COMMUNITY): Payer: Self-pay

## 2021-04-12 ENCOUNTER — Ambulatory Visit: Payer: Medicaid Other | Admitting: Infectious Diseases

## 2021-04-13 ENCOUNTER — Other Ambulatory Visit: Payer: Self-pay

## 2021-04-13 ENCOUNTER — Encounter: Payer: Self-pay | Admitting: Infectious Diseases

## 2021-04-13 ENCOUNTER — Ambulatory Visit (INDEPENDENT_AMBULATORY_CARE_PROVIDER_SITE_OTHER): Payer: Medicaid Other | Admitting: Infectious Diseases

## 2021-04-13 VITALS — BP 121/83 | HR 65 | Temp 97.5°F | Wt 155.6 lb

## 2021-04-13 DIAGNOSIS — Z7252 High risk homosexual behavior: Secondary | ICD-10-CM | POA: Diagnosis not present

## 2021-04-13 DIAGNOSIS — Z8619 Personal history of other infectious and parasitic diseases: Secondary | ICD-10-CM | POA: Diagnosis not present

## 2021-04-13 DIAGNOSIS — B04 Monkeypox: Secondary | ICD-10-CM

## 2021-04-13 NOTE — Progress Notes (Signed)
[facility]                                                             [place], [place], [place], [address]                                                                  Phn. [phone]; Fax: [phone]                                                                             Date: 04/13/21  Reason for Referral: Monkey Pox  Requesting  Provider:Crystal Edison Pace   Assessment Problem List Items Addressed This Visit       Other   High risk sexual behavior   Monkeypox - Primary   H/O gonorrhea    Monkey Pox Positive ( multiples lesion as in pictures including penile shaft) H/o Disseminated Gonorrhea s/p tx High Risk Sexual behavior ( MSM with multiple partners)  Plan Tecovirmat 655m PO BID for 14 days. Pharmacy met with patient and instructed with dosing schedule/administration Consent form filled  Isolation precautions discussed - covering the lesions, isolate and avoid social contact until all lesions have healed/scabbed with no new lesions Follow up in 1 week for mid treatment  evaluation Will also need to discuss PrEP given his high risk sexual behavior at some point in his future visit.   All questions and concerns were discussed and addressed. Patient verbalized understanding of the plan. 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 Patient tells me he was seen in the ED on 916 when he had noticed a firm painless sore on the shaft of his penis 3 days ago ( had attempted an insertive intercourse first time during a threesome). He was though to have syphilis and was given a shot of 2.4 million units of  benzathine penicillin in his buttock. HIV and RPR during that visit was negative. However, he returned to ED on 9/21 with new lesions developing in his upper and lower extremities and back/chest. Also had headache. LP was done for concerns for Neurosyphilis but unremarkable. A lesion from his rt hand was swabbed for concern of Monkey pox which came back positive.   Patient denies any prodromal symptoms like fever, chills, malaise, lymph node swelling, sore throat. Headache resolved, Denies any numbness/weakness/tingling. Denies blurry vision, neck and back pain.   He tells me he was working in [employer] was fired few days ago as he did not show up to his work due to his ER visits. He VAPES, alcohol is occasionally, denies  any IVDU. He has not been sexually active since diagnosed with Monkey pox. He was sexually active with 2 males prior to monkey pox diagnosis and has broke up with one of the two. He denies any pain in the lesions, the lesions in his back,chest, rt hand, left hand, rt thigh are already starting to heal. However, the lesion in the penile shaft has not still epithelialized. He denies any new lesions. Denies any Lymph node swelling.   ROS: Constitutional: Negative for fever, chills, activity change, appetite change, fatigue and unexpected weight change.  Respiratory: Negative for cough, shortness of breath Cardiovascular: Negative for chest pain, palpitations and leg swelling.  Gastrointestinal: Negative for nausea, vomiting, abdominal pain, diarrhea/constipation, .  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  No Known Allergies  Social History   Socioeconomic History    Marital status: Single    Spouse name: Not on file   Number of children: Not on file   Years of education: Not on file   Highest education level: Not on file  Occupational History   Not on file  Tobacco Use   Smoking status: Never    Passive exposure: Yes   Smokeless tobacco: Never  Vaping Use   Vaping Use: Never used  Substance and Sexual Activity   Alcohol use: Yes    Comment: occ   Drug use: Not Currently    Types: Marijuana    Comment: Occasionally   Sexual activity: Yes  Other Topics Concern   Not on file  Social History Narrative   Not on file   Social Determinants of Health   Financial Resource Strain: Not on file  Food Insecurity: Not on file  Transportation Needs: Not on file  Physical Activity: Not on file  Stress: Not on file  Social Connections: Not on file  Intimate Partner Violence: Not on file   Family History  Problem Relation Age of Onset   Diabetes Mother    Healthy Father      Vitals BP 121/83   Pulse 65   Temp (!) 97.5 F (36.4 C) (Oral)   Wt 155 lb 9.6 oz (70.6 kg)   BMI 21.70 kg/m    Examination  General - not in acute distress, comfortably sitting in chair HEENT - PEERLA, no pallor and no icterus No lymphadenopathy in the cervical, axillary and bilateral groin regions  Chest - b/l clear air entry, no additional sounds CVS- Normal s1s2, RRR Abdomen - Soft, Non tender , non distended Ext- no pedal edema Neuro: grossly normal Psych : calm and cooperative Skin                            Recent labs CBC Latest Ref Rng & Units 04/05/2021 06/20/2019 09/30/2018  WBC 4.0 - 10.5 K/uL 7.0 6.6 7.9  Hemoglobin 13.0 - 17.0 g/dL 15.2 15.6 14.9  Hematocrit 39.0 - 52.0 % 44.3 45.6 45.3  Platelets 150 - 400 K/uL 316 336 275   CMP Latest Ref Rng & Units 04/05/2021 06/20/2019 09/30/2018  Glucose 70 - 99 mg/dL 81 114(H) 108(H)  BUN 6 - 20 mg/dL _0 Creatinine 0.61 - 1.24 mg/dL 0.92 1.03  0.93  Sodium 135 - 145 mmol/L  139 141 137  Potassium 3.5 - 5.1 mmol/L 3.9 4.5 3.9  Chloride 98 - 111 mmol/L 104 104 104  CO2 22 - 32 mmol/L _0 Calcium 8.9 - 10.3 mg/dL 9.2 9.6 9.3  Total Protein 6.5 - 8.1 g/dL 7.7 - -  Total Bilirubin 0.3 - 1.2 mg/dL 0.8 - -  Alkaline Phos 38 - 126 U/L 84 - -  AST 15 - 41 U/L 14(L) - -  ALT 0 - 44 U/L 10 - -     Pertinent Microbiology Results for orders placed or performed during the hospital encounter of 04/05/21  SARS CORONAVIRUS 2 (TAT 6-24 HRS)     Status: None   Collection Time: 04/05/21  3:45 AM  Result Value Ref Range Status   SARS Coronavirus 2 NEGATIVE NEGATIVE Final    Comment: (NOTE) SARS-CoV-2 target nucleic acids are NOT DETECTED.  The SARS-CoV-2 RNA is generally detectable in upper and lower respiratory specimens during the acute phase of infection. Negative results do not preclude SARS-CoV-2 infection, do not rule out co-infections with other pathogens, and should not be used as the sole basis for treatment or other patient management decisions. Negative results must be combined with clinical observations, patient history, and epidemiological information. The expected result is Negative.  Fact Sheet for Patients: SugarRoll.be  Fact Sheet for Healthcare Providers: https://www.woods-mathews.com/  This test is not yet approved or cleared by the [place] FDA and  has been authorized for detection and/or diagnosis of SARS-CoV-2 by FDA under an Emergency Use Authorization (EUA). This EUA will remain  in effect (meaning this test can be used) for the duration of the COVID-19 declaration under Se ction 564(b)(1) of the Act, 21 U.S.C. section 360bbb-3(b)(1), unless the authorization is terminated or revoked sooner.  Performed at [facility], [place] [address] [place], [place] [address]   Monkeypox Virus DNA, Qualitative Real-Time PCR     Status: Abnormal   Collection Time: 04/05/21  4:29 AM    Specimen: Sterile Swab  Result Value Ref Range Status   Orthopoxvirus DNA, QL PCR DETECTED (A) NOT DETECTED Final    Comment: (NOTE) Non-variola Orthopoxvirus DNA detected by real-time PCR. Performed At: [facility] [place], [place] [id number] Rush Farmer MD [id number]   CSF culture     Status: None   Collection Time: 04/05/21  5:55 AM   Specimen: CSF; Cerebrospinal Fluid  Result Value Ref Range Status   Specimen Description CSF  Final   Special Requests BACK  Final   Gram Stain CYTOSPIN SMEAR NO WBC SEEN NO ORGANISMS SEEN   Final   Culture   Final    NO GROWTH 3 DAYS Performed at [facility], [address] [place], [place] [address]    Report Status 04/08/2021 FINAL  Final   Pertinent Imaging All pertinent labs/Imagings/notes reviewed. All pertinent plain films and CT images have been personally visualized and interpreted; radiology reports have been reviewed. Decision making incorporated into the Impression / Recommendations.  I have spent a total of 60 minutes of face-to-face and non-face-to-face time, excluding clinical staff time, preparing to see patient, ordering tests and/or medications, and provide counseling the patient    Electronically signed by:  Rosiland Oz, MD Infectious Disease Physician [facility] [address] [place],  [address] Phone: [phone]  Fax: [phone]

## 2021-04-17 ENCOUNTER — Ambulatory Visit: Payer: Medicaid Other | Admitting: Nurse Practitioner

## 2021-04-17 ENCOUNTER — Telehealth: Payer: Self-pay

## 2021-04-17 NOTE — Telephone Encounter (Signed)
Attempted  to call patient to follow up and see how he is doing since last visit. Will also need to inform patient of upcoming appt on 10/6 for follow up on mpx treatment. Will send mychart message to confirm appt.  Not able to reach him via phone. Voicemail is full. Will send mychart message. Juanita Laster, RMA

## 2021-04-20 ENCOUNTER — Ambulatory Visit: Payer: Medicaid Other | Admitting: Family

## 2021-05-18 ENCOUNTER — Ambulatory Visit: Payer: Medicaid Other | Admitting: Nurse Practitioner

## 2021-08-18 ENCOUNTER — Other Ambulatory Visit: Payer: Self-pay

## 2021-08-18 ENCOUNTER — Ambulatory Visit (INDEPENDENT_AMBULATORY_CARE_PROVIDER_SITE_OTHER): Payer: Medicaid Other | Admitting: Nurse Practitioner

## 2021-08-18 ENCOUNTER — Encounter: Payer: Self-pay | Admitting: Nurse Practitioner

## 2021-08-18 VITALS — BP 126/67 | HR 75 | Temp 98.3°F | Ht 70.47 in | Wt 152.2 lb

## 2021-08-18 DIAGNOSIS — Z113 Encounter for screening for infections with a predominantly sexual mode of transmission: Secondary | ICD-10-CM

## 2021-08-18 DIAGNOSIS — R634 Abnormal weight loss: Secondary | ICD-10-CM | POA: Diagnosis not present

## 2021-08-18 NOTE — Patient Instructions (Addendum)

## 2021-08-18 NOTE — Progress Notes (Signed)
[facility] Patient [facility] [address] [place], [place]  [address] Phone:  [phone]   Fax:  [phone]   Established Patient Office Visit  Subjective:  Patient ID: Dwayne Sandoval, male    DOB: Sep 08, 1995  Age: 26 y.o. MRN: [id number]  CC:  Chief Complaint  Patient presents with   Follow-up    Pt is here today to get his STD testing done with no concerns or issues to discuss.    HPI Dwayne Sandoval presents for follow up. Dwayne Sandoval  has a past medical history of Gonorrhea and Dwayne Sandoval (HCC).   Dwayne Sandoval is in today for a STD check. 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 This was due to fear of additional lesions progressing as per side effect of treatment. He was treated for Syphilis.   Denies oral lesions,, sore throat, pelvic pain, penile discharge, dysuria, nocturia any visible ulcers or lesions.  A poor appetite is reported; daily meal; weight down 15 pounds in 5 months.  Past Medical History:  Diagnosis Date   Gonorrhea    Dwayne Sandoval [facility])     History reviewed. No pertinent surgical history.  Family History  Problem Relation Age of Onset   Diabetes Mother    Healthy Father     Social History   Socioeconomic History   Marital status: Single    Spouse name: Not on file   Number of children: Not on file   Years of education: Not on file   Highest education level: Not on file  Occupational History   Not on file  Tobacco Use   Smoking status: Never    Passive exposure: Yes   Smokeless tobacco: Never  Vaping Use   Vaping Use: Never used  Substance and Sexual Activity   Alcohol use: Yes    Comment: occ   Drug use: Not Currently    Types: Marijuana    Comment: Occasionally   Sexual activity: Yes  Other Topics Concern   Not on file  Social History Narrative   Not on file   Social Determinants of Health   Financial Resource Strain: Not on file  Food Insecurity: Not on file  Transportation Needs: Not on file  Physical  Activity: Not on file  Stress: Not on file  Social Connections: Not on file  Intimate Partner Violence: Not on file    Outpatient Medications Prior to Visit  Medication Sig Dispense Refill   guaiFENesin (MUCINEX) 600 MG 12 hr tablet Take 1 tablet (600 mg total) by mouth 2 (two) times daily. (Patient not taking: Reported on 04/13/2021) 20 tablet 0   lidocaine (XYLOCAINE) 2 % solution Use as directed 15 mLs in the mouth or throat as needed for mouth pain. (Patient not taking: Reported on 04/13/2021) 100 mL 0   No facility-administered medications prior to visit.    No Known Allergies  ROS Review of Systems  Constitutional: Negative.   HENT: Negative.    Eyes: Negative.   Respiratory: Negative.    Cardiovascular: Negative.   Gastrointestinal: Negative.   Endocrine: Negative.   Genitourinary: Negative.   Musculoskeletal: Negative.   Skin: Negative.   Neurological:  Negative for dizziness and headaches.  Psychiatric/Behavioral: Negative.       Objective:    Physical Exam Constitutional:      General: He is not in acute distress.    Appearance: He is normal weight.  HENT:     Head: Normocephalic and atraumatic.     Nose: Nose  normal.     Mouth/Throat:     Mouth: Mucous membranes are moist.  Cardiovascular:     Rate and Rhythm: Normal rate.  Pulmonary:     Effort: Pulmonary effort is normal.  Musculoskeletal:        General: Normal range of motion.     Cervical back: Normal range of motion.  Skin:    General: Skin is warm and dry.     Capillary Refill: Capillary refill takes less than 2 seconds.  Neurological:     General: No focal deficit present.     Mental Status: He is alert.  Psychiatric:        Mood and Affect: Mood normal.        Behavior: Behavior normal.        Thought Content: Thought content normal.        Judgment: Judgment normal.    BP 126/67    Pulse 75    Temp 98.3 F (36.8 C)    Ht 5' 10.47" (1.79 m)    Wt 152 lb 3.2 oz (69 kg)    SpO2 100%     BMI 21.55 kg/m  Wt Readings from Last 3 Encounters:  08/18/21 152 lb 3.2 oz (69 kg)  04/13/21 155 lb 9.6 oz (70.6 kg)  04/05/21 167 lb (75.8 kg)     There are no preventive care reminders to display for this patient.   There are no preventive care reminders to display for this patient.   Lab Results  Component Value Date   TSH 1.390 08/18/2021   Lab Results  Component Value Date   WBC 7.0 04/05/2021   HGB 15.2 04/05/2021   HCT 44.3 04/05/2021   MCV 85.4 04/05/2021   PLT 316 04/05/2021   Lab Results  Component Value Date   NA 139 04/05/2021   K 3.9 04/05/2021   CO2 27 04/05/2021   GLUCOSE 81 04/05/2021   BUN 13 04/05/2021   CREATININE 0.92 04/05/2021   BILITOT 0.8 04/05/2021   ALKPHOS 84 04/05/2021   AST 14 (L) 04/05/2021   ALT 10 04/05/2021   PROT 7.7 04/05/2021   ALBUMIN 4.2 04/05/2021   CALCIUM 9.2 04/05/2021   ANIONGAP 8 04/05/2021   No results found for: CHOL No results found for: HDL No results found for: LDLCALC No results found for: TRIG No results found for: CHOLHDL Lab Results  Component Value Date   HGBA1C 5.4 06/22/2020      Assessment & Plan:   Problem List Items Addressed This Visit   None Visit Diagnoses     Screening for STD (sexually transmitted disease)    -  Primary   Relevant Orders   HIV Antibody (routine testing w rflx) (Completed)   Chlamydia/Gonococcus/Trichomonas, NAA   RPR (Completed)   Hepatitis C antibody (Completed)   HSV(herpes simplex vrs) 1+2 ab-IgG (Completed)   Unintentional weight loss     Education provided along with written information   Relevant Orders   TSH (Completed)   Comp. Metabolic Panel (12) (Completed)       No orders of the defined types were placed in this encounter.   Follow-up: Return in about 3 months (around 11/15/2021) for 350093 STD testing.    Barbette Merino, NP

## 2021-08-19 ENCOUNTER — Encounter: Payer: Self-pay | Admitting: Nurse Practitioner

## 2021-08-21 LAB — CHLAMYDIA/GONOCOCCUS/TRICHOMONAS, NAA
Chlamydia by NAA: NEGATIVE
Gonococcus by NAA: NEGATIVE
Trich vag by NAA: NEGATIVE

## 2021-08-22 ENCOUNTER — Telehealth: Payer: Self-pay

## 2021-08-22 LAB — COMP. METABOLIC PANEL (12)
AST: 18 IU/L (ref 0–40)
Albumin/Globulin Ratio: 1.7 (ref 1.2–2.2)
Albumin: 4.7 g/dL (ref 4.1–5.2)
Alkaline Phosphatase: 107 IU/L (ref 44–121)
BUN/Creatinine Ratio: 6 — ABNORMAL LOW (ref 9–20)
BUN: 6 mg/dL (ref 6–20)
Bilirubin Total: 1.2 mg/dL (ref 0.0–1.2)
Calcium: 9.6 mg/dL (ref 8.7–10.2)
Chloride: 103 mmol/L (ref 96–106)
Creatinine, Ser: 0.93 mg/dL (ref 0.76–1.27)
Globulin, Total: 2.7 g/dL (ref 1.5–4.5)
Glucose: 88 mg/dL (ref 70–99)
Potassium: 4.3 mmol/L (ref 3.5–5.2)
Sodium: 140 mmol/L (ref 134–144)
Total Protein: 7.4 g/dL (ref 6.0–8.5)
eGFR: 118 mL/min/{1.73_m2} (ref >59)

## 2021-08-22 LAB — HIV ANTIBODY (ROUTINE TESTING W REFLEX): HIV Screen 4th Generation wRfx: NONREACTIVE

## 2021-08-22 LAB — HEPATITIS C ANTIBODY: Hep C Virus Ab: <0.1 s/co ratio (ref 0.0–0.9)

## 2021-08-22 LAB — TSH: TSH: 1.39 u[IU]/mL (ref 0.450–4.500)

## 2021-08-22 LAB — HSV(HERPES SIMPLEX VRS) I + II AB-IGG
HSV 1 Glycoprotein G Ab, IgG: <0.91 index (ref 0.00–0.90)
HSV 2 IgG, Type Spec: <0.91 index (ref 0.00–0.90)

## 2021-08-22 LAB — RPR: RPR Ser Ql: REACTIVE — AB

## 2021-08-22 LAB — RPR, QUANT+TP ABS (REFLEX)
Rapid Plasma Reagin, Quant: 1:4 {titer} — ABNORMAL HIGH
T Pallidum Abs: REACTIVE — AB

## 2021-08-22 NOTE — Telephone Encounter (Signed)
[phone] [facility]  About his std test

## 2021-08-23 NOTE — Telephone Encounter (Signed)
Called.

## 2021-09-05 DIAGNOSIS — Z114 Encounter for screening for human immunodeficiency virus [HIV]: Secondary | ICD-10-CM | POA: Diagnosis not present

## 2021-09-05 DIAGNOSIS — A630 Anogenital (venereal) warts: Secondary | ICD-10-CM | POA: Diagnosis not present

## 2021-09-05 DIAGNOSIS — A515 Early syphilis, latent: Secondary | ICD-10-CM | POA: Diagnosis not present

## 2021-09-05 DIAGNOSIS — Z113 Encounter for screening for infections with a predominantly sexual mode of transmission: Secondary | ICD-10-CM | POA: Diagnosis not present

## 2021-11-02 ENCOUNTER — Other Ambulatory Visit: Payer: Self-pay

## 2021-11-02 ENCOUNTER — Emergency Department (HOSPITAL_COMMUNITY)
Admission: EM | Admit: 2021-11-02 | Discharge: 2021-11-02 | Disposition: A | Discharge status: Home / Self Care | Payer: Medicaid Other | Attending: Emergency Medicine | Admitting: Emergency Medicine

## 2021-11-02 ENCOUNTER — Encounter (HOSPITAL_COMMUNITY): Payer: Self-pay | Admitting: Emergency Medicine

## 2021-11-02 DIAGNOSIS — Z113 Encounter for screening for infections with a predominantly sexual mode of transmission: Secondary | ICD-10-CM | POA: Insufficient documentation

## 2021-11-02 DIAGNOSIS — R590 Localized enlarged lymph nodes: Secondary | ICD-10-CM | POA: Insufficient documentation

## 2021-11-02 DIAGNOSIS — Z20822 Contact with and (suspected) exposure to covid-19: Secondary | ICD-10-CM | POA: Insufficient documentation

## 2021-11-02 DIAGNOSIS — R0981 Nasal congestion: Secondary | ICD-10-CM | POA: Insufficient documentation

## 2021-11-02 DIAGNOSIS — I10 Essential (primary) hypertension: Secondary | ICD-10-CM | POA: Diagnosis not present

## 2021-11-02 LAB — GC/CHLAMYDIA PROBE AMP (~~LOC~~) NOT AT ARMC
Chlamydia: NEGATIVE
Comment: NEGATIVE
Comment: NORMAL
Neisseria Gonorrhea: NEGATIVE

## 2021-11-02 LAB — URINALYSIS, ROUTINE W REFLEX MICROSCOPIC
Bacteria, UA: NONE SEEN
Bilirubin Urine: NEGATIVE
Glucose, UA: NEGATIVE mg/dL
Hgb urine dipstick: NEGATIVE
Ketones, ur: NEGATIVE mg/dL
Leukocytes,Ua: NEGATIVE
Nitrite: NEGATIVE
Protein, ur: 30 mg/dL — AB
Specific Gravity, Urine: 1.025 (ref 1.005–1.030)
pH: 7 (ref 5.0–8.0)

## 2021-11-02 LAB — RESP PANEL BY RT-PCR (FLU A&B, COVID) ARPGX2
Influenza A by PCR: NEGATIVE
Influenza B by PCR: NEGATIVE
SARS Coronavirus 2 by RT PCR: NEGATIVE

## 2021-11-02 LAB — HIV ANTIBODY (ROUTINE TESTING W REFLEX): HIV Screen 4th Generation wRfx: NONREACTIVE

## 2021-11-02 NOTE — Discharge Instructions (Signed)
'  s areYou are seen in the ER today for screening for STIs.  You may follow these test results in your MyChart account.  You also been tested for COVID and the flu.  Please secondary to allergies given the ongoing nature since onset of pollen season.  You may begin to take over-the-counter allergy medication called cetirizine (Zyrtec).  Follow-up with your primary care doctor and return to the ER with any new severe symptoms, or if you test positive for any STIs, so that you may be treated. ?

## 2021-11-02 NOTE — ED Triage Notes (Signed)
Patient wants std testing and is congested. Patient states that they do not feel good. ?

## 2021-11-02 NOTE — ED Provider Notes (Signed)
?[facility]-EMERGENCY DEPT ?Provider Note ? ? ?CSN: [id number] ?Arrival date & time: 11/02/21  0243 ? ?  ? ?History ?Chief Complaint  ?Patient presents with  ? SEXUALLY TRANSMITTED DISEASE  ? ? ?Dwayne Sandoval is a 26 y.o. male who is sexually active with male partners without protection who presents desiring STI screening. 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  ? ?He states he has been experiencing nasal congestion for the last couple of weeks without significant history of allergies in the past.  No fever, chills, nausea, vomiting, difficulty swallowing, or cough.  No shortness of breath.  ? ?Patient denies any hematuria, urinary frequency or urgency, denies any penile discharge or skin changes in the genitals. ? ?I personally reviewed his medical records.  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 ? ?HPI ? ?  ? ?Home Medications ?Prior to Admission medications   ?Medication Sig Start Date End Date Taking? Authorizing Provider  ?guaiFENesin (MUCINEX) 600 MG 12 hr tablet Take 1 tablet (600 mg total) by mouth 2 (two) times daily. ?Patient not taking: Reported on 04/13/2021 03/05/21   Valinda Hoar, NP  ?lidocaine (XYLOCAINE) 2 % solution Use as directed 15 mLs in the mouth or throat as needed for mouth pain. ?Patient not taking: Reported on 04/13/2021 03/05/21   Valinda Hoar, NP  ?   ? ?Allergies    ?Patient has no known allergies.   ? ?Review of Systems   ?Review of Systems  ?HENT:  Positive for congestion.   ?Gastrointestinal: Negative.   ?Genitourinary: Negative.  Negative for decreased urine volume, flank pain, frequency, hematuria, penile discharge, penile pain, penile swelling, scrotal swelling, testicular pain and urgency.  ? ?Physical Exam ?Updated Vital Signs ?BP (!) 125/93 (BP Location: Left Arm)   Pulse 76   Temp 98.9 ?F (37.2 ?C)  (Oral)   Resp 16   Ht 5\' 10"  (1.778 m)   Wt 68 kg   SpO2 100%   BMI 21.52 kg/m?  ?Physical Exam ?Vitals and nursing note reviewed.  ?Constitutional:   ?   Appearance: He is not ill-appearing or toxic-appearing.  ?HENT:  ?   Head: Normocephalic and atraumatic.  ?   Nose: Congestion present. No rhinorrhea.  ?   Mouth/Throat:  ?   Mouth: Mucous membranes are moist.  ?   Pharynx: Oropharynx is clear. Uvula midline. No oropharyngeal exudate or posterior oropharyngeal erythema.  ?Eyes:  ?   General:     ?   Right eye: No discharge.     ?   Left eye: No discharge.  ?   Conjunctiva/sclera: Conjunctivae normal.  ?Neck:  ?   Trachea: Trachea and phonation normal.  ?   Meningeal: Brudzinski's sign and Kernig's sign absent.  ?Cardiovascular:  ?   Rate and Rhythm: Normal rate and regular rhythm.  ?   Pulses: Normal pulses.  ?   Heart sounds: Normal heart sounds. No murmur heard. ?Pulmonary:  ?   Effort: Pulmonary effort is normal. No respiratory distress.  ?   Breath sounds: Normal breath sounds. No wheezing or rales.  ?Abdominal:  ?   General: Bowel sounds are normal. There is no distension.  ?   Palpations: Abdomen is soft.  ?   Tenderness: There is no abdominal tenderness. There is no guarding or rebound.  ?Genitourinary: ?  Comments: Deferred as patient is asymptomatic ?Musculoskeletal:     ?   General: No deformity.  ?   Cervical back: Neck supple. No rigidity or crepitus. No pain with movement or spinous process tenderness. Normal range of motion.  ?Lymphadenopathy:  ?   Cervical: Cervical adenopathy present.  ?   Right cervical: No superficial cervical adenopathy. ?   Left cervical: Superficial cervical adenopathy present.  ?Skin: ?   General: Skin is warm and dry.  ?   Capillary Refill: Capillary refill takes less than 2 seconds.  ?Neurological:  ?   General: No focal deficit present.  ?   Mental Status: He is alert and oriented to person, place, and time. Mental status is at baseline.  ?Psychiatric:     ?   Mood  and Affect: Mood normal.  ? ? ?ED Results / Procedures / Treatments   ?Labs ?(all labs ordered are listed, but only abnormal results are displayed) ?Labs Reviewed  ?RESP PANEL BY RT-PCR (FLU A&B, COVID) ARPGX2  ? ? ?EKG ?None ? ?Radiology ?No results found. ? ?Procedures ?Procedures  ? ? ?Medications Ordered in ED ?Medications - No data to display ? ?ED Course/ Medical Decision Making/ A&P ?  ?                        ?Medical Decision Making ?26 year old male with history of multiple STIs in the past who presents with request for STI screening and evaluation of congestion x3 weeks. ? ?Mildly hypertensive on intake, vital signs otherwise normal.  Cardiopulmonary abdominal exams are benign.  Patient with mild nasal congestion without evidence of oropharyngeal erythema or abscess.  Shotty bilateral anterior cervical lymphadenopathy.  GU exam deferred.   ? ?Amount and/or Complexity of Data Reviewed ?Labs: ordered. ?   Details: STI screening pending. Respiratory pathogen panel negative.  UA negative for infection. ? ? ?STI panels pending.  Patient to follow-up his results in the MyChart account.  May follow-up in the outpatient setting with PCP or return to the ER for any positive test results or new severe symptoms. ? ?Lantz voiced understanding of his medical evaluation and treatment plan. Each of their questions answered to their expressed satisfaction.  Return precautions were given.  Patient is well-appearing, stable, and was discharged in good condition. ?This chart was dictated using voice recognition software, Dragon. Despite the best efforts of this provider to proofread and correct errors, errors may still occur which can change documentation meaning. ? ? ?Final Clinical Impression(s) / ED Diagnoses ?Final diagnoses:  ?None  ? ? ?Rx / DC Orders ?ED Discharge Orders   ? ? None  ? ?  ? ? ?  ?Paris Lore, PA-C ?11/02/21 0505 ? ?  ?Marily Memos, MD ?11/02/21 0510 ? ?

## 2021-11-03 ENCOUNTER — Telehealth: Payer: Self-pay

## 2021-11-03 NOTE — Telephone Encounter (Signed)
Transition Care Management Follow-up Telephone Call ?Date of discharge and from where: 11/02/2021 from [facility] ?How have you been since you were released from the hospital? Patient stated that he feeling like he is sick. Patient encourage to reach out [facility] for follow up. Patient was previously followed by Dwayne Sandoval.  ?Any questions or concerns? No ? ?Items Reviewed: ?Did the pt receive and understand the discharge instructions provided? Yes  ?Medications obtained and verified? Yes  ?Other? No  ?Any new allergies since your discharge? No  ?Dietary orders reviewed? No ?Do you have support at home? Yes  ? ?Functional Questionnaire: (I = Independent and D = Dependent) ?ADLs: I ? ?Bathing/Dressing- I ? ?Meal Prep- I ? ?Eating- I ? ?Maintaining continence- I ? ?Transferring/Ambulation- I ? ?Managing Meds- I ? ? ?Follow up appointments reviewed: ? ?PCP Hospital f/u appt confirmed? No   ?Specialist Hospital f/u appt confirmed? No   ?Are transportation arrangements needed? No  ?If their condition worsens, is the pt aware to call PCP or go to the Emergency Dept.? Yes ?Was the patient provided with contact information for the PCP's office or ED? Yes ?Was to pt encouraged to call back with questions or concerns? Yes ? ?

## 2021-11-05 LAB — RPR
RPR Ser Ql: REACTIVE — AB
RPR Titer: 1:4 {titer}

## 2021-11-08 LAB — T.PALLIDUM AB, TOTAL: T Pallidum Abs: REACTIVE — AB

## 2021-11-09 ENCOUNTER — Other Ambulatory Visit: Payer: Self-pay

## 2021-11-09 ENCOUNTER — Emergency Department (HOSPITAL_COMMUNITY)
Admission: EM | Admit: 2021-11-09 | Discharge: 2021-11-09 | Disposition: A | Discharge status: Home / Self Care | Payer: Medicaid Other | Attending: Emergency Medicine | Admitting: Emergency Medicine

## 2021-11-09 ENCOUNTER — Encounter (HOSPITAL_COMMUNITY): Payer: Self-pay

## 2021-11-09 DIAGNOSIS — A530 Latent syphilis, unspecified as early or late: Secondary | ICD-10-CM

## 2021-11-09 DIAGNOSIS — Z202 Contact with and (suspected) exposure to infections with a predominantly sexual mode of transmission: Secondary | ICD-10-CM | POA: Insufficient documentation

## 2021-11-09 DIAGNOSIS — R768 Other specified abnormal immunological findings in serum: Secondary | ICD-10-CM | POA: Diagnosis not present

## 2021-11-09 DIAGNOSIS — A539 Syphilis, unspecified: Secondary | ICD-10-CM | POA: Diagnosis not present

## 2021-11-09 MED ORDER — PENICILLIN G BENZATHINE 1200000 UNIT/2ML IM SUSY
2.4000 10*6.[IU] | PREFILLED_SYRINGE | Freq: Once | INTRAMUSCULAR | Status: AC
  Administered 2021-11-09: 2.4 10*6.[IU] via INTRAMUSCULAR
  Filled 2021-11-09: qty 4

## 2021-11-09 NOTE — Discharge Instructions (Addendum)
It was our pleasure to provide your ER care today - we hope that you feel better. ? ?Follow up with [facility] Dept in the next 1-2 weeks. ? ?Return to ER if worse, new symptoms, fevers, rash or other concern.  ?

## 2021-11-09 NOTE — ED Triage Notes (Signed)
Pt states that his syphilis test came back positive and he needs treatment.  ?

## 2021-11-09 NOTE — ED Provider Notes (Signed)
?[facility]-EMERGENCY DEPT ?Provider Note ? ? ?CSN: [id number] ?Arrival date & time: 11/09/21  0248 ? ?  ? ?History ? ?Chief Complaint  ?Patient presents with  ? Exposure to STD  ? ? ?Dwayne Sandoval is a 26 y.o. male. ? ?Patient indicates was treated for +syphilis test a few months ago, and his partner has been 'messing around', and states had recent repeat test positive and is requesting tx. Denies fever/chills/sweats. No rash or skin lesions.  ? ?The history is provided by the patient and medical records.  ?Exposure to STD ?Pertinent negatives include no chest pain, no abdominal pain, no headaches and no shortness of breath.  ? ?  ? ?Home Medications ?Prior to Admission medications   ?Medication Sig Start Date End Date Taking? Authorizing Provider  ?guaiFENesin (MUCINEX) 600 MG 12 hr tablet Take 1 tablet (600 mg total) by mouth 2 (two) times daily. ?Patient not taking: Reported on 04/13/2021 03/05/21   Valinda Hoar, NP  ?lidocaine (XYLOCAINE) 2 % solution Use as directed 15 mLs in the mouth or throat as needed for mouth pain. ?Patient not taking: Reported on 04/13/2021 03/05/21   Valinda Hoar, NP  ?   ? ?Allergies    ?Patient has no known allergies.   ? ?Review of Systems   ?Review of Systems  ?Constitutional:  Negative for chills and fever.  ?HENT:  Negative for sore throat.   ?Eyes:  Negative for redness.  ?Respiratory:  Negative for shortness of breath.   ?Cardiovascular:  Negative for chest pain.  ?Gastrointestinal:  Negative for abdominal pain and vomiting.  ?Genitourinary:  Negative for flank pain.  ?Musculoskeletal:  Negative for back pain and neck pain.  ?Skin:  Negative for rash.  ?Neurological:  Negative for headaches.  ?Hematological:  Does not bruise/bleed easily.  ?Psychiatric/Behavioral:  Negative for confusion.   ? ?Physical Exam ?Updated Vital Signs ?BP 138/83   Pulse (!) 58   Resp 20   SpO2 99%  ?Physical Exam ?Vitals and nursing note reviewed.  ?Constitutional:   ?    Appearance: Normal appearance. He is well-developed.  ?HENT:  ?   Head: Atraumatic.  ?   Nose: Nose normal.  ?   Mouth/Throat:  ?   Mouth: Mucous membranes are moist.  ?Eyes:  ?   General: No scleral icterus. ?   Conjunctiva/sclera: Conjunctivae normal.  ?Neck:  ?   Trachea: No tracheal deviation.  ?Cardiovascular:  ?   Rate and Rhythm: Normal rate.  ?   Pulses: Normal pulses.  ?Pulmonary:  ?   Effort: Pulmonary effort is normal. No accessory muscle usage or respiratory distress.  ?Abdominal:  ?   General: There is no distension.  ?Genitourinary: ?   Comments: No cva tenderness. ?Musculoskeletal:     ?   General: No swelling.  ?   Cervical back: Neck supple.  ?Skin: ?   General: Skin is warm and dry.  ?   Findings: No rash.  ?Neurological:  ?   Mental Status: He is alert.  ?   Comments: Alert, speech clear.   ?Psychiatric:     ?   Mood and Affect: Mood normal.  ? ? ?ED Results / Procedures / Treatments   ?Labs ?(all labs ordered are listed, but only abnormal results are displayed) ?Labs Reviewed - No data to display ? ?EKG ?None ? ?Radiology ?No results found. ? ?Procedures ?Procedures  ? ? ?Medications Ordered in ED ?Medications  ?penicillin g benzathine (BICILLIN LA) 1200000  UNIT/2ML injection 2.4 Million Units (2.4 Million Units Intramuscular Given 11/09/21 0541)  ? ? ?ED Course/ Medical Decision Making/ A&P ?  ?                        ?Medical Decision Making ?Problems Addressed: ?Exposure to syphilis: acute illness or injury that poses a threat to life or bodily functions ?Positive RPR test: acute illness or injury ? ?Amount and/or Complexity of Data Reviewed ?External Data Reviewed: labs and notes. ? ?Risk ?Prescription drug management. ? ? ?Reviewed nursing notes and prior charts for additional history. Discussed w pt, that ab test will stay + and that rpr also may give + result even if tx for next 1-2 months. Pt requests tx in ED, says possible re-exposure.  ? ?Pt requests tx for +syphilis test.  ? ?Bez pcn g  2.4 mu im. ? ?No adverse rxn noted.  ? ?[facility]iscussed w pt[facility]. ? ? ? ? ? ? ? ?Final Clinical Impression(s) / ED Diagnoses ?Final diagnoses:  ?Positive RPR test  ?Exposure to syphilis  ? ? ?Rx / DC Orders ?ED Discharge Orders   ? ? None  ? ?  ? ? ?  ?Cathren Laine, MD ?11/09/21 [phone] ? ?

## 2021-11-10 ENCOUNTER — Telehealth: Payer: Self-pay

## 2021-11-10 NOTE — Telephone Encounter (Signed)
Transition Care Management Unsuccessful Follow-up Telephone Call ? ?Date of discharge and from where:  11/09/2021 from [facility] ? ?Attempts:  1st Attempt ? ?Reason for unsuccessful TCM follow-up call:  Unable to leave message ? ? ? ?

## 2021-11-14 NOTE — Telephone Encounter (Signed)
Transition Care Management Follow-up Telephone Call ?Date of discharge and from where: 11/09/2021 from [facility] ?How have you been since you were released from the hospital? Patient stated that he is feeling well and did not have any questions or concerns.  ?Any questions or concerns? No ? ?Items Reviewed: ?Did the pt receive and understand the discharge instructions provided? Yes  ?Medications obtained and verified? Yes  ?Other? No  ?Any new allergies since your discharge? No  ?Dietary orders reviewed? No ?Do you have support at home? Yes  ? ?Functional Questionnaire: (I = Independent and D = Dependent) ?ADLs: I ? ?Bathing/Dressing- I ? ?Meal Prep- I ? ?Eating- I ? ?Maintaining continence- I ? ?Transferring/Ambulation- I ? ?Managing Meds- I ? ? ?Follow up appointments reviewed: ? ?PCP Hospital f/u appt confirmed? No   ?Specialist Hospital f/u appt confirmed? No   ?Are transportation arrangements needed? No  ?If their condition worsens, is the pt aware to call PCP or go to the Emergency Dept.? Yes ?Was the patient provided with contact information for the PCP's office or ED? Yes ?Was to pt encouraged to call back with questions or concerns? Yes ? ?

## 2021-11-17 ENCOUNTER — Ambulatory Visit (INDEPENDENT_AMBULATORY_CARE_PROVIDER_SITE_OTHER): Payer: Medicaid Other | Admitting: Nurse Practitioner

## 2021-11-17 ENCOUNTER — Encounter: Payer: Self-pay | Admitting: Nurse Practitioner

## 2021-11-17 ENCOUNTER — Ambulatory Visit: Payer: Medicaid Other | Admitting: Nurse Practitioner

## 2021-11-17 VITALS — BP 121/83 | HR 98 | Temp 97.9°F | Ht 70.47 in | Wt 151.8 lb

## 2021-11-17 DIAGNOSIS — Z113 Encounter for screening for infections with a predominantly sexual mode of transmission: Secondary | ICD-10-CM

## 2021-11-17 NOTE — Progress Notes (Signed)
? ?[place]  [address] ?Phone:  [phone]   Fax:  [phone] ?Subjective:  ? Patient ID: Dwayne Sandoval, male    DOB: 10-10-1995, 26 y.o.   MRN: [id number] ? ?Chief Complaint  ?Patient presents with  ? Follow-up  ?  Patient is here for STD testing with no concerns or issues.  ? ?HPI ?Dwayne Sandoval 26 y.o. 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 To the [facility] for STI screening. ? ?Patient states that he has had one partner in the past 6 mths. 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 Denies any symptoms. Denies any other concerns today. States that he is no longer with previous partner.  ? ?Denies any fatigue, chest pain, shortness of breath, HA or dizziness. Denies any blurred vision, numbness or tingling. ? ? ?Past Medical History:  ?Diagnosis Date  ? Gonorrhea   ? 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 ? ?History reviewed. No pertinent surgical history. ? ?Family History  ?Problem Relation Age of Onset  ? Diabetes Mother   ? Healthy Father   ? ? ?Social History  ? ?Socioeconomic History  ? Marital status: Single  ?  Spouse name: Not on file  ? Number of children: Not on file  ? Years of education: Not on file  ? Highest education level: Not on file  ?Occupational History  ? Not on file  ?Tobacco Use  ? Smoking status: Never  ?  Passive exposure: Yes  ? Smokeless tobacco: Never  ?Vaping Use  ? Vaping Use: Never used  ?Substance and Sexual Activity  ? Alcohol use: Yes  ?  Comment: occ  ? Drug use: Not Currently  ?  Types: Marijuana  ?  Comment: Occasionally  ? Sexual activity: Yes  ?Other Topics Concern  ? Not on file  ?Social History Narrative  ? Not on file  ? ?Social Determinants of Health  ? ?Financial Resource Strain: Not on file  ?Food Insecurity: Not on file  ?Transportation Needs: Not on file  ?Physical Activity: Not on file  ?Stress: Not on file   ?Social Connections: Not on file  ?Intimate Partner Violence: Not on file  ? ? ?Outpatient Medications Prior to Visit  ?Medication Sig Dispense Refill  ? guaiFENesin (MUCINEX) 600 MG 12 hr tablet Take 1 tablet (600 mg total) by mouth 2 (two) times daily. (Patient not taking: Reported on 04/13/2021) 20 tablet 0  ? lidocaine (XYLOCAINE) 2 % solution Use as directed 15 mLs in the mouth or throat as needed for mouth pain. (Patient not taking: Reported on 04/13/2021) 100 mL 0  ? ?No facility-administered medications prior to visit.  ? ? ?No Known Allergies ? ?Review of Systems  ?Constitutional: Negative.  Negative for chills, fever and malaise/fatigue.  ?Respiratory: Negative.  Negative for cough and shortness of breath.   ?Cardiovascular: Negative.  Negative for chest pain, palpitations and leg swelling.  ?Gastrointestinal:  Negative for abdominal pain, blood in stool, constipation, diarrhea, nausea and vomiting.  ?Skin: Negative.   ?Neurological: Negative.   ?Psychiatric/Behavioral: Negative.  Negative for depression. The patient is not nervous/anxious.   ?All other systems reviewed and are negative. ? ?   ?Objective:  ?  ?Physical Exam ?Constitutional:   ?   General: He is not in acute distress. ?   Appearance: Normal appearance. He is normal weight.  ?HENT:  ?  Head: Normocephalic.  ?Neck:  ?   Vascular: No carotid bruit.  ?Cardiovascular:  ?   Rate and Rhythm: Normal rate and regular rhythm.  ?   Pulses: Normal pulses.  ?   Heart sounds: Normal heart sounds.  ?   Comments: No obvious peripheral edema ?Pulmonary:  ?   Effort: Pulmonary effort is normal.  ?   Breath sounds: Normal breath sounds.  ?Musculoskeletal:  ?   Cervical back: Normal range of motion and neck supple. No rigidity or tenderness.  ?Lymphadenopathy:  ?   Cervical: No cervical adenopathy.  ?Skin: ?   General: Skin is warm and dry.  ?   Capillary Refill: Capillary refill takes less than 2 seconds.  ?Neurological:  ?   General: No focal deficit  present.  ?   Mental Status: He is alert and oriented to person, place, and time.  ?Psychiatric:     ?   Mood and Affect: Mood normal.     ?   Behavior: Behavior normal.     ?   Thought Content: Thought content normal.     ?   Judgment: Judgment normal.  ? ? ?BP 121/83   Pulse 98   Temp 97.9 ?F (36.6 ?C)   Ht 5' 10.47" (1.79 m)   Wt 151 lb 12.8 oz (68.9 kg)   SpO2 99%   BMI 21.49 kg/m?  ?Wt Readings from Last 3 Encounters:  ?11/17/21 151 lb 12.8 oz (68.9 kg)  ?11/02/21 150 lb (68 kg)  ?08/18/21 152 lb 3.2 oz (69 kg)  ? ? ? ?There is no immunization history on file for this patient. ? ?Diabetic Foot Exam - Simple   ?No data filed ?  ? ? ?Lab Results  ?Component Value Date  ? TSH 1.390 08/18/2021  ? ?Lab Results  ?Component Value Date  ? WBC 7.0 04/05/2021  ? HGB 15.2 04/05/2021  ? HCT 44.3 04/05/2021  ? MCV 85.4 04/05/2021  ? PLT 316 04/05/2021  ? ?Lab Results  ?Component Value Date  ? NA 140 08/18/2021  ? K 4.3 08/18/2021  ? CO2 27 04/05/2021  ? GLUCOSE 88 08/18/2021  ? BUN 6 08/18/2021  ? CREATININE 0.93 08/18/2021  ? BILITOT 1.2 08/18/2021  ? ALKPHOS 107 08/18/2021  ? AST 18 08/18/2021  ? ALT 10 04/05/2021  ? PROT 7.4 08/18/2021  ? ALBUMIN 4.7 08/18/2021  ? CALCIUM 9.6 08/18/2021  ? ANIONGAP 8 04/05/2021  ? EGFR 118 08/18/2021  ? ?No results found for: CHOL ?No results found for: HDL ?No results found for: [place] ?No results found for: TRIG ?No results found for: CHOLHDL ?Lab Results  ?Component Value Date  ? HGBA1C 5.4 06/22/2020  ? ? ?   ?Assessment & Plan:  ? ?Problem List Items Addressed This Visit   ?None ?Visit Diagnoses   ? ? Routine screening for STI (sexually transmitted infection)    -  Primary  ? Relevant Orders  ? RPR+HIV+GC+CT Panel (Completed)  ? HSV(herpes simplex vrs) 1+2 ab-IgG (Completed) ?Discussed and encouraged continued safe sex practices   ? ?Maintain upcoming follow up with PCP, sooner as needed   ? ? ?I am having Dwayne Sandoval maintain his lidocaine and guaiFENesin. ? ?No orders of  the defined types were placed in this encounter. ? ? ? ?Teena Dunk, NP ?  ?

## 2021-11-17 NOTE — Patient Instructions (Signed)
You were seen today in the [facility] for STI screening . Labs were collected, results will be available via MyChart or, if abnormal, you will be contacted by clinic staff. Follow up as needed.  ?

## 2021-11-18 LAB — HSV(HERPES SIMPLEX VRS) I + II AB-IGG
HSV 1 Glycoprotein G Ab, IgG: <0.91 index (ref 0.00–0.90)
HSV 2 IgG, Type Spec: <0.91 index (ref 0.00–0.90)

## 2021-11-20 LAB — RPR+HIV+GC+CT PANEL
Chlamydia trachomatis, NAA: NEGATIVE
HIV Screen 4th Generation wRfx: NONREACTIVE
Neisseria Gonorrhoeae by PCR: NEGATIVE
RPR Ser Ql: REACTIVE — AB

## 2021-11-20 LAB — RPR, QUANT. (REFLEX): Rapid Plasma Reagin, Quant: 1:2 {titer} — ABNORMAL HIGH

## 2021-11-21 LAB — RPR, QUANT+TP ABS (REFLEX)
Rapid Plasma Reagin, Quant: 1:2 {titer} — ABNORMAL HIGH
T Pallidum Abs: REACTIVE — AB

## 2021-11-21 LAB — RPR: RPR Ser Ql: REACTIVE — AB

## 2022-01-04 IMAGING — CT CT HEAD W/O CM
4 series · 16 of 47 positions shown, 18 images · non-contrast
Comparison: Brain MRI 12/13/2006. head CT 09/15/2007.

CLINICAL DATA: 24-year-old male with headache, possible syphilis.

EXAM:
CT HEAD WITHOUT CONTRAST
TECHNIQUE: Contiguous axial images were obtained from the base of the skull
through the vertex without intravenous contrast.

[Series 2: head wo · axial · 0.44mm/px · z∈[-112,+3]mm · 7 of 31 slices shown, 9 images]
[im 4/31  brain]
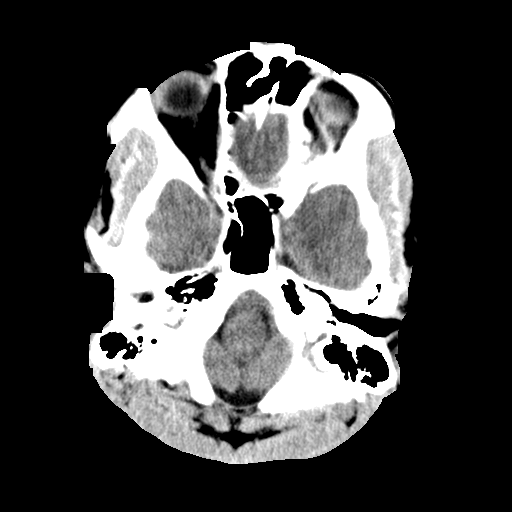
[im 4/31  bone]
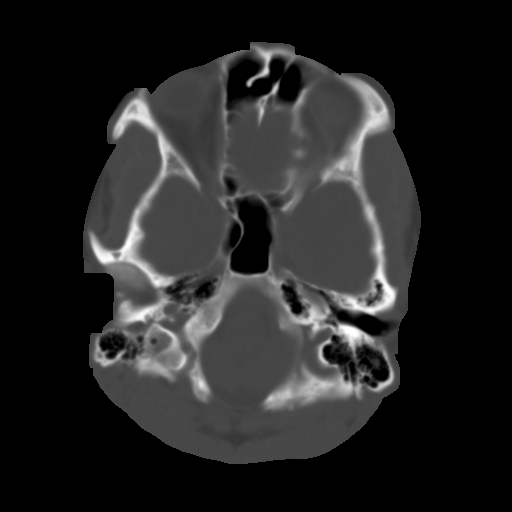
[im 8/31  brain]
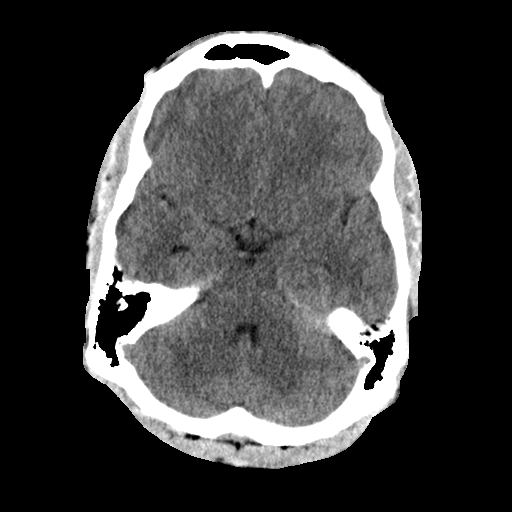
[im 12/31  brain]
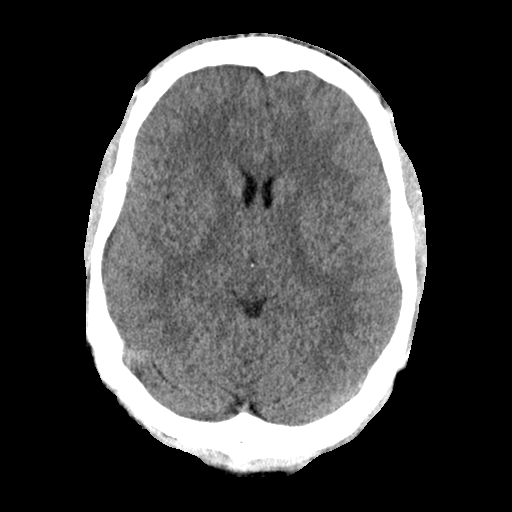
[im 16/31  brain]
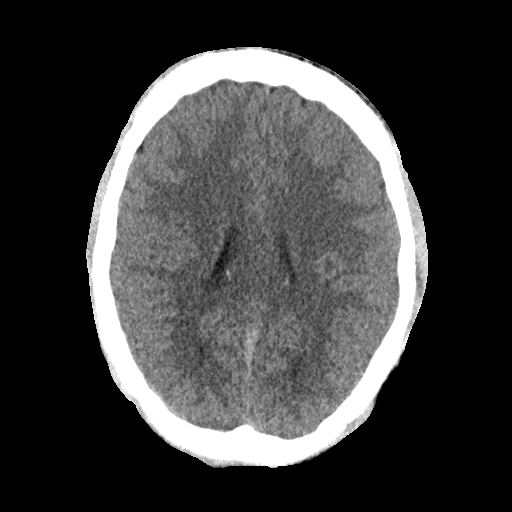
[im 19/31  brain]
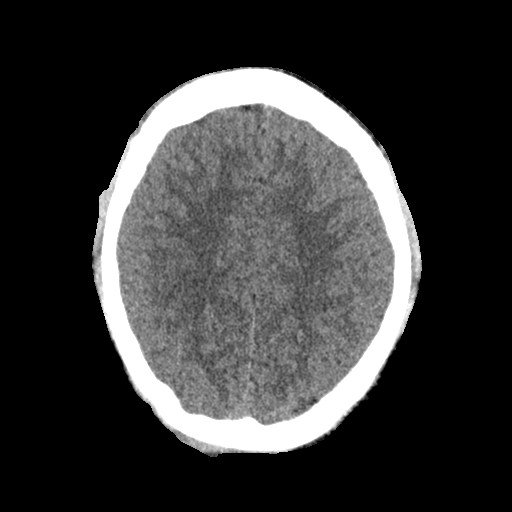
[im 19/31  bone]
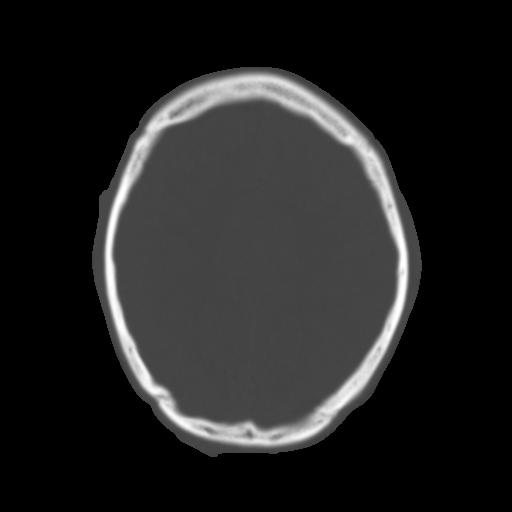
[im 23/31  brain]
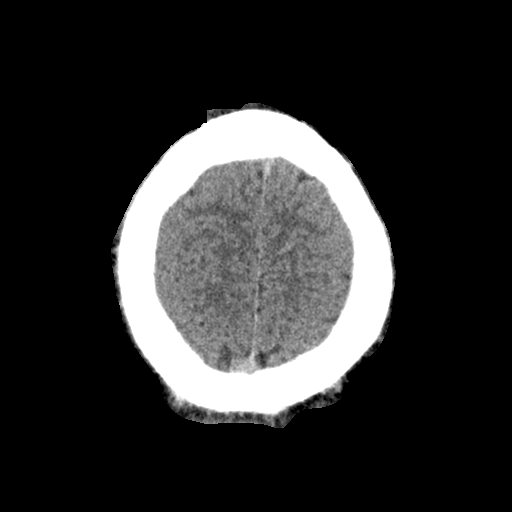
[im 27/31  brain]
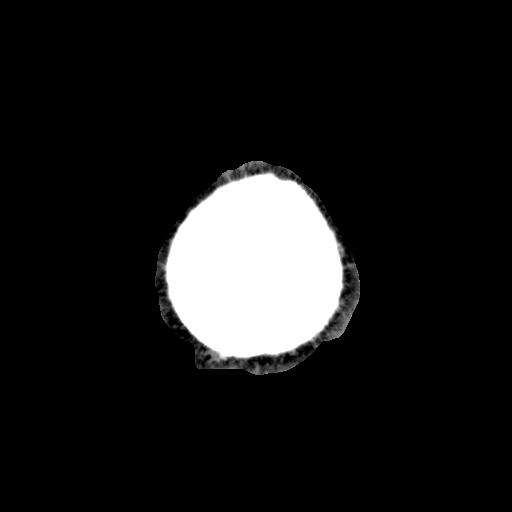

[Series 3: head bone · axial · 0.44mm/px · z∈[-113,-83]mm · 3 of 76 slices shown]
[im 8/76  bone]
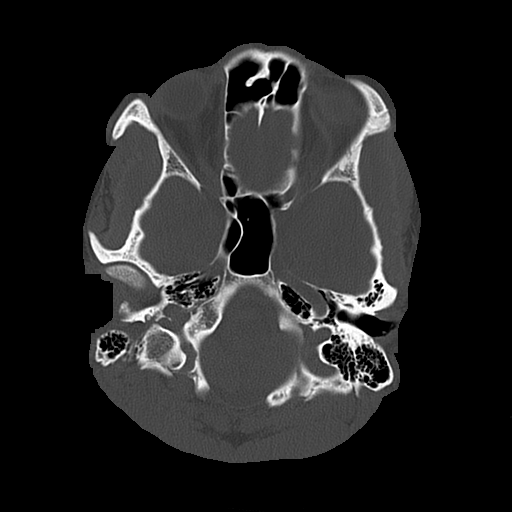
[im 16/76  bone]
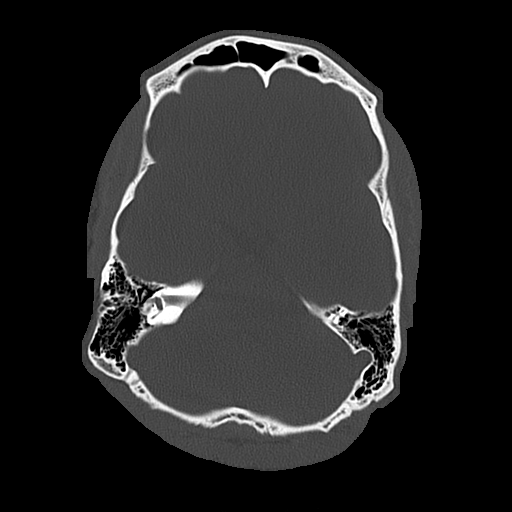
[im 23/76  bone]
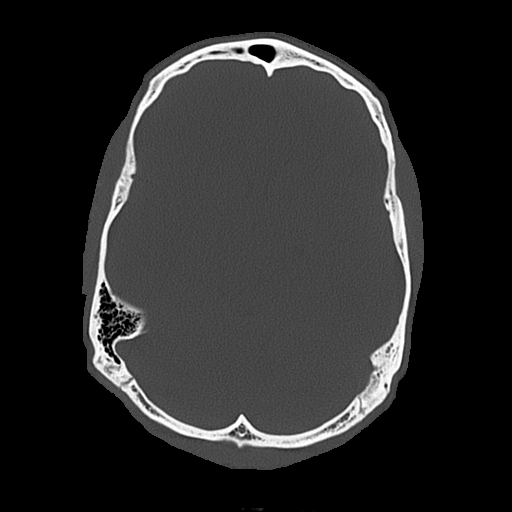

[Series 4: coronal soft · coronal · 0.33mm/px · 3 of 68 slices shown]
[im 23/68  brain]
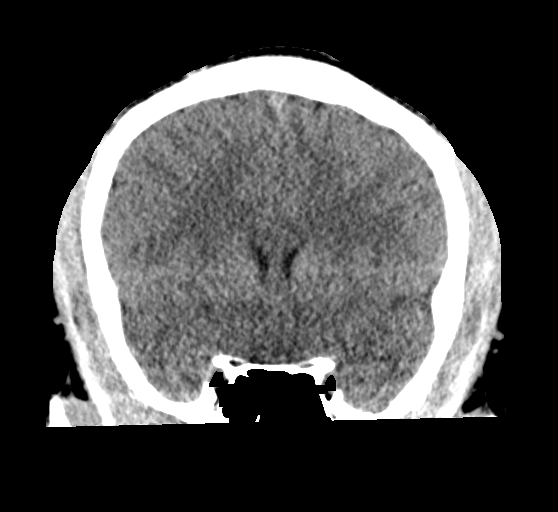
[im 30/68  brain]
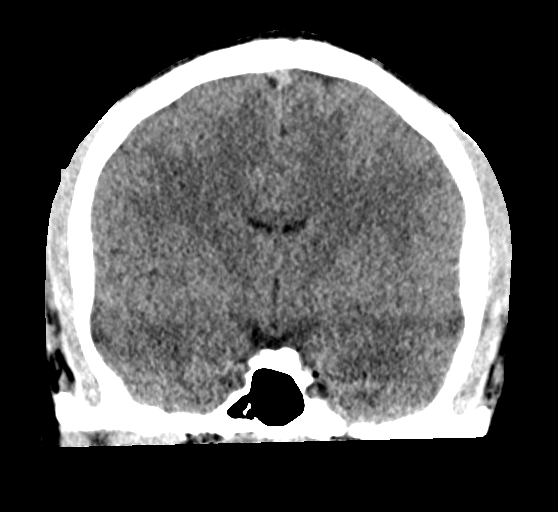
[im 38/68  brain]
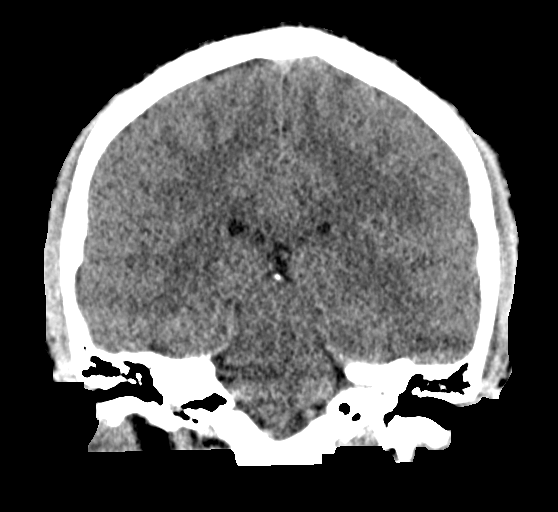

[Series 5: sagittal soft · sagittal · 0.33mm/px · 3 of 57 slices shown]
[im 19/57  brain]
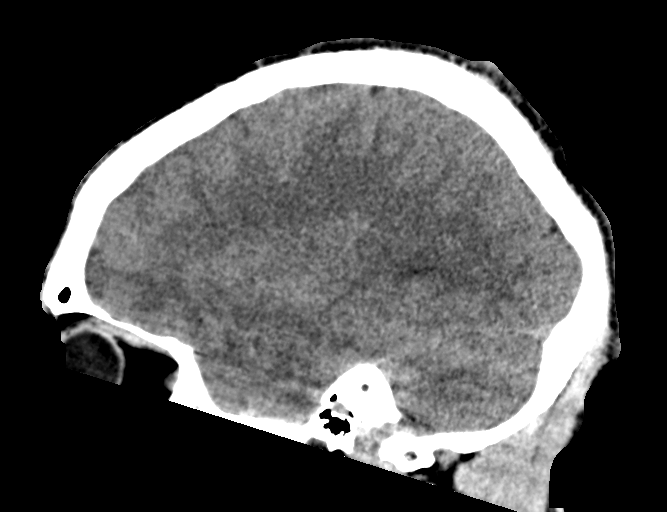
[im 29/57  brain]
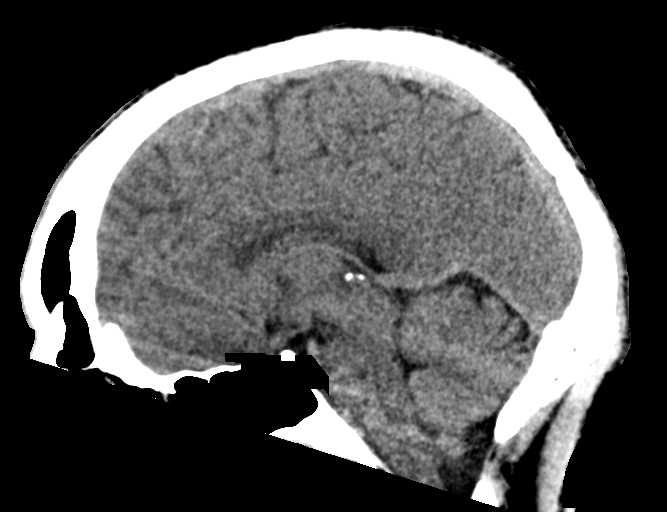
[im 38/57  brain]
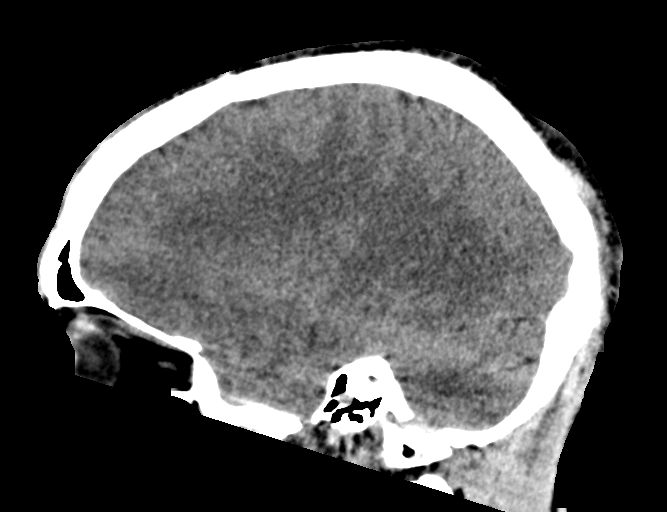

[16 of 47 positions shown; findings below may reference images not displayed]

FINDINGS: Brain: Normal cerebral volume. No midline shift, ventriculomegaly,
mass effect, evidence of mass lesion, intracranial hemorrhage or
evidence of cortically based acute infarction. Gray-white matter
differentiation is within normal limits throughout the brain.

Vascular: No suspicious intracranial vascular hyperdensity.

Skull: Negative.

Sinuses/Orbits: Visualized paranasal sinuses and mastoids are clear.

Other: Leftward gaze, but otherwise negative orbit and scalp soft
tissues.
IMPRESSION: Normal noncontrast Head CT.

## 2022-02-16 ENCOUNTER — Ambulatory Visit (INDEPENDENT_AMBULATORY_CARE_PROVIDER_SITE_OTHER): Payer: Medicaid Other | Admitting: Nurse Practitioner

## 2022-02-16 ENCOUNTER — Encounter: Payer: Self-pay | Admitting: Nurse Practitioner

## 2022-02-16 VITALS — BP 117/62 | HR 68 | Temp 98.1°F | Ht 70.47 in | Wt 152.2 lb

## 2022-02-16 DIAGNOSIS — Z113 Encounter for screening for infections with a predominantly sexual mode of transmission: Secondary | ICD-10-CM | POA: Diagnosis not present

## 2022-02-16 LAB — POCT URINALYSIS DIP (CLINITEK)
Bilirubin, UA: NEGATIVE
Blood, UA: NEGATIVE
Glucose, UA: NEGATIVE mg/dL
Ketones, POC UA: NEGATIVE mg/dL
Leukocytes, UA: NEGATIVE
Nitrite, UA: NEGATIVE
POC PROTEIN,UA: NEGATIVE
Spec Grav, UA: 1.025 (ref 1.010–1.025)
Urobilinogen, UA: 0.2 E.U./dL
pH, UA: 7 (ref 5.0–8.0)

## 2022-02-16 NOTE — Patient Instructions (Signed)
1. Routine screening for STI (sexually transmitted infection)  - POCT URINALYSIS DIP (CLINITEK) - RPR+HIV+GC+CT Panel - HSV(herpes simplex vrs) 1+2 ab-IgG  Follow up:  Follow up as needed  Preventing Sexually Transmitted Infections, Adult Sexually transmitted infections (STIs) are spread from person to person (are contagious). They are spread, or transmitted, through bodily fluids exchanged during sex or sexual contact. These bodily fluids include saliva, semen, blood, vaginal mucus, and urine. STIs are very common and can occur among people of all ages. Some common STIs include: Herpes. Hepatitis B. Chlamydia. Gonorrhea. Syphilis. HPV (human papillomavirus). HIV, also called the human immunodeficiency virus. This is the virus that can cause AIDS (acquired immunodeficiency syndrome). Often, people who have these STIs do not have symptoms. Even without symptoms, these infections can be spread from person to person and require treatment. How can these conditions affect me? STIs can be treated, and many STIs can be cured. However, some STIs cannot be cured and will affect you for the rest of your life. Certain STIs may: Require you to take medicine for the rest of your life. Affect your ability to have children (your fertility). Increase your risk for developing another STI or certain serious health conditions. These may include: Cervical cancer. Head and neck cancer. Pelvic inflammatory disease (PID), in women. Organ damage or damage to other parts of your body, if the infection spreads. Cause problems during pregnancy and may be transmitted to the baby during the pregnancy or childbirth. What can increase my risk? You may have an increased risk for developing an STI if: You have unprotected sex or sexual contact. You have more than one sex partner. You have a sex partner who has multiple sex partners. You have sexual contact with someone who has an STI. You have an STI or you had  an STI before. You inject drugs or have a sex partner or partners who inject drugs. What actions can I take to prevent STIs? The only way to completely prevent STIs is not to have sex of any kind. This is called practicing abstinence. If you are sexually active, you can protect yourself and others by taking these actions to lower your risk of getting an STI: Lifestyle Avoid mixing alcohol, drugs, and sex. Alcohol and drug use can affect your ability to make good decisions and can lead to risky sexual behaviors. Medicines Ask your health care provider about taking pre-exposure prophylaxis (PrEP) to prevent HIV infection. General information  Stay up to date on vaccinations. Certain vaccines can lower your risk of getting certain STIs, such as: Hepatitis A and hepatitis B vaccines. HPV (human papillomavirus) vaccine. Have only one sex partner (be monogamous) or limit the number of sex partners you have. Use methods that prevent the exchange of body fluids between partners (barrier protection) correctly every time you have sexual contact. Barrier protection can be used during oral, vaginal, or anal sex. Commonly used barrier methods include: External (male) condom. Internal (male) condom. Dental dam. Use a new barrier method for every sex act from start to finish. Get tested for STIs. Have your partners get tested, too. If you test positive for an STI, follow recommendations from your health care provider about treatment and make sure your sex partners are tested and treated. Birth control pills, injections, implants, and intrauterine devices (IUDs) do not protect against STIs. To prevent both STIs and pregnancy, always use a condom with another form of birth control. Some STIs, such as herpes, are spread through skin-to-skin contact. A  barrier method may not protect you from getting such STIs. Avoid all sexual contact if you or your partners have herpes and there is an active flare with open  sores. Where to find more information Learn more about STIs from: Centers for Disease Control and Prevention: More information about specific STIs: BloggingList.ca Places to get sexual health counseling and treatment for free or at a low cost: gettested.TonerPromos.no U.S. Department of Health and Human Services: http://hoffman.com/ Summary Sexually transmitted infections (STIs) can spread through exchanging bodily fluids during sexual contact. Fluids include saliva, semen, blood, vaginal mucus, and urine. You may have an increased risk for developing an STI if you have unprotected sex. If you do have sex, limit your number of sex partners and use barrier protection every time you have sex. This information is not intended to replace advice given to you by your health care provider. Make sure you discuss any questions you have with your health care provider. Document Revised: 10/30/2021 Document Reviewed: 08/17/2019 Elsevier Patient Education  2023 [employer].

## 2022-02-16 NOTE — Assessment & Plan Note (Signed)
-   POCT URINALYSIS DIP (CLINITEK) - RPR+HIV+GC+CT Panel - HSV(herpes simplex vrs) 1+2 ab-IgG  Follow up:  Follow up as needed

## 2022-02-16 NOTE — Progress Notes (Signed)
@Patient  ID: Dwayne Sandoval, male    DOB: November 07, 1995, 26 y.o.   MRN: [id number]  Chief Complaint  Patient presents with   Follow-up    Pt stated he is here to get STI testing.    Referring provider: Teena Dunk, NP   HPI  Patient presents today for STD testing.  Patient states that he does not have any known exposure symptoms.  Patient states that he did have a past history of syphilis. Denies f/c/s, n/v/d, hemoptysis, PND, leg swelling Denies chest pain or edema    No Known Allergies   There is no immunization history on file for this patient.  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)     Tobacco History: Social History   Tobacco Use  Smoking Status Never   Passive exposure: Yes  Smokeless Tobacco Never   Counseling given: Not Answered   Outpatient Encounter Medications as of 02/16/2022  Medication Sig   guaiFENesin (MUCINEX) 600 MG 12 hr tablet Take 1 tablet (600 mg total) by mouth 2 (two) times daily. (Patient not taking: Reported on 04/13/2021)   lidocaine (XYLOCAINE) 2 % solution Use as directed 15 mLs in the mouth or throat as needed for mouth pain. (Patient not taking: Reported on 04/13/2021)   No facility-administered encounter medications on file as of 02/16/2022.     Review of Systems  Review of Systems  Constitutional: Negative.   HENT: Negative.    Cardiovascular: Negative.   Gastrointestinal: Negative.   Allergic/Immunologic: Negative.   Neurological: Negative.   Psychiatric/Behavioral: Negative.         Physical Exam  BP 117/62 (BP Location: Right Arm, Patient Position: Sitting, Cuff Size: Normal)   Pulse 68   Temp 98.1 F (36.7 C)   Ht 5' 10.47" (1.79 m)   Wt 152 lb 4 oz (69.1 kg)   SpO2 100%   BMI 21.56 kg/m   Wt Readings from Last 5 Encounters:  02/16/22 152 lb 4 oz (69.1 kg)  11/17/21 151 lb 12.8 oz (68.9 kg)  11/02/21 150 lb (68 kg)  08/18/21 152 lb 3.2 oz (69 kg)  04/13/21 155  lb 9.6 oz (70.6 kg)     Physical Exam Vitals and nursing note reviewed.  Constitutional:      General: He is not in acute distress.    Appearance: He is well-developed.  Cardiovascular:     Rate and Rhythm: Normal rate and regular rhythm.  Pulmonary:     Effort: Pulmonary effort is normal.     Breath sounds: Normal breath sounds.  Skin:    General: Skin is warm and dry.  Neurological:     Mental Status: He is alert and oriented to person, place, and time.      Assessment & Plan:   Routine screening for STI (sexually transmitted infection) - POCT URINALYSIS DIP (CLINITEK) - RPR+HIV+GC+CT Panel - HSV(herpes simplex vrs) 1+2 ab-IgG  Follow up:  Follow up as needed  Patient Instructions  1. Routine screening for STI (sexually transmitted infection)  - POCT URINALYSIS DIP (CLINITEK) - RPR+HIV+GC+CT Panel - HSV(herpes simplex vrs) 1+2 ab-IgG  Follow up:  Follow up as needed  Preventing Sexually Transmitted Infections, Adult Sexually transmitted infections (STIs) are spread from person to person (are contagious). They are spread, or transmitted, through bodily fluids exchanged during sex or sexual contact. These bodily fluids include saliva, semen, blood, vaginal mucus, and urine. STIs are very common and can occur  among people of all ages. Some common STIs include: Herpes. Hepatitis B. Chlamydia. Gonorrhea. Syphilis. HPV (human papillomavirus). HIV, also called the human immunodeficiency virus. This is the virus that can cause AIDS (acquired immunodeficiency syndrome). Often, people who have these STIs do not have symptoms. Even without symptoms, these infections can be spread from person to person and require treatment. How can these conditions affect me? STIs can be treated, and many STIs can be cured. However, some STIs cannot be cured and will affect you for the rest of your life. Certain STIs may: Require you to take medicine for the rest of your life. Affect  your ability to have children (your fertility). Increase your risk for developing another STI or certain serious health conditions. These may include: Cervical cancer. Head and neck cancer. Pelvic inflammatory disease (PID), in women. Organ damage or damage to other parts of your body, if the infection spreads. Cause problems during pregnancy and may be transmitted to the baby during the pregnancy or childbirth. What can increase my risk? You may have an increased risk for developing an STI if: You have unprotected sex or sexual contact. You have more than one sex partner. You have a sex partner who has multiple sex partners. You have sexual contact with someone who has an STI. You have an STI or you had an STI before. You inject drugs or have a sex partner or partners who inject drugs. What actions can I take to prevent STIs? The only way to completely prevent STIs is not to have sex of any kind. This is called practicing abstinence. If you are sexually active, you can protect yourself and others by taking these actions to lower your risk of getting an STI: Lifestyle Avoid mixing alcohol, drugs, and sex. Alcohol and drug use can affect your ability to make good decisions and can lead to risky sexual behaviors. Medicines Ask your health care provider about taking pre-exposure prophylaxis (PrEP) to prevent HIV infection. General information  Stay up to date on vaccinations. Certain vaccines can lower your risk of getting certain STIs, such as: Hepatitis A and hepatitis B vaccines. HPV (human papillomavirus) vaccine. Have only one sex partner (be monogamous) or limit the number of sex partners you have. Use methods that prevent the exchange of body fluids between partners (barrier protection) correctly every time you have sexual contact. Barrier protection can be used during oral, vaginal, or anal sex. Commonly used barrier methods include: External (male) condom. Internal (male)  condom. Dental dam. Use a new barrier method for every sex act from start to finish. Get tested for STIs. Have your partners get tested, too. If you test positive for an STI, follow recommendations from your health care provider about treatment and make sure your sex partners are tested and treated. Birth control pills, injections, implants, and intrauterine devices (IUDs) do not protect against STIs. To prevent both STIs and pregnancy, always use a condom with another form of birth control. Some STIs, such as herpes, are spread through skin-to-skin contact. A barrier method may not protect you from getting such STIs. Avoid all sexual contact if you or your partners have herpes and there is an active flare with open sores. Where to find more information Learn more about STIs from: Centers for Disease Control and Prevention: More information about specific STIs: BloggingList.ca Places to get sexual health counseling and treatment for free or at a low cost: gettested.TonerPromos.no U.S. Department of Health and Human Services: http://hoffman.com/ Summary Sexually transmitted infections (  STIs) can spread through exchanging bodily fluids during sexual contact. Fluids include saliva, semen, blood, vaginal mucus, and urine. You may have an increased risk for developing an STI if you have unprotected sex. If you do have sex, limit your number of sex partners and use barrier protection every time you have sex. This information is not intended to replace advice given to you by your health care provider. Make sure you discuss any questions you have with your health care provider. Document Revised: 10/30/2021 Document Reviewed: 08/17/2019 Elsevier Patient Education  [address]      Ivonne Andrew, [place] 02/16/2022

## 2022-02-20 LAB — RPR+HIV+GC+CT PANEL
Chlamydia trachomatis, NAA: NEGATIVE
HIV Screen 4th Generation wRfx: NONREACTIVE
Neisseria Gonorrhoeae by PCR: NEGATIVE
RPR Ser Ql: NONREACTIVE

## 2022-02-20 LAB — HSV(HERPES SIMPLEX VRS) I + II AB-IGG
HSV 1 Glycoprotein G Ab, IgG: <0.91 index (ref 0.00–0.90)
HSV 2 IgG, Type Spec: <0.91 index (ref 0.00–0.90)

## 2022-05-18 ENCOUNTER — Ambulatory Visit (INDEPENDENT_AMBULATORY_CARE_PROVIDER_SITE_OTHER): Payer: Medicaid Other | Admitting: Nurse Practitioner

## 2022-05-18 ENCOUNTER — Encounter: Payer: Self-pay | Admitting: Nurse Practitioner

## 2022-05-18 VITALS — BP 115/74 | HR 80 | Temp 98.2°F | Ht 70.0 in | Wt 155.0 lb

## 2022-05-18 DIAGNOSIS — F32A Depression, unspecified: Secondary | ICD-10-CM | POA: Diagnosis not present

## 2022-05-18 DIAGNOSIS — Z113 Encounter for screening for infections with a predominantly sexual mode of transmission: Secondary | ICD-10-CM

## 2022-05-18 NOTE — Progress Notes (Signed)
@Patient  ID: Dwayne Sandoval, male    DOB: 02-Mar-1996, 26 y.o.   MRN: [id number]  Chief Complaint  Patient presents with   Exposure to STD    Pt is requesting STD testing, no symptoms at this time Pt is also complaining of a new onset of depression     Referring provider: Teena Dunk, NP   HPI  Patient presents today for STD testing.  He states that he is not having any symptoms at this time but does have possible exposure.  Patient also complains today of depression.  We discussed that we can refer him to psychiatry for further evaluation.  Patient is agreeable. Denies f/c/s, n/v/d, hemoptysis, PND, leg swelling Denies chest pain or edema          No Known Allergies   There is no immunization history on file for this patient.  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 (Patient not taking: Reported on 04/13/2021)   [DISCONTINUED] lidocaine (XYLOCAINE) 2 % solution Use as directed 15 mLs in the mouth or throat as needed for mouth pain. (Patient not taking: Reported on 04/13/2021)   No facility-administered encounter medications on file as of 05/18/2022.     Review of Systems  Review of Systems  Constitutional: Negative.   HENT: Negative.    Cardiovascular: Negative.   Gastrointestinal: Negative.   Allergic/Immunologic: Negative.   Neurological: Negative.   Psychiatric/Behavioral: Negative.         Physical Exam  BP 115/74 (BP Location: Left Arm, Patient Position: Sitting, Cuff Size: Normal)   Pulse 80   Temp 98.2 F (36.8 C) (Oral)   Ht 5\' 10"  (1.778 m)   Wt 155 lb  (70.3 kg)   BMI 22.24 kg/m   Wt Readings from Last 5 Encounters:  09/07/22 158 lb (71.7 kg)  08/21/22 152 lb (68.9 kg)  08/20/22 152 lb (68.9 kg)  05/18/22 155 lb (70.3 kg)  02/16/22 152 lb 4 oz (69.1 kg)     Physical Exam Vitals and nursing note reviewed.  Constitutional:      General: He is not in acute distress.    Appearance: He is well-developed.  Cardiovascular:     Rate and Rhythm: Normal rate and regular rhythm.  Pulmonary:     Effort: Pulmonary effort is normal.     Breath sounds: Normal breath sounds.  Skin:    General: Skin is warm and dry.  Neurological:     Mental Status: He is alert and oriented to person, place, and time.      Lab Results:  CBC    Component Value Date/Time   WBC 9.0 08/20/2022 0130   RBC 4.93 08/20/2022 0130   HGB 14.8 08/20/2022 0130   HCT 43.0 08/20/2022 0130   PLT 305 08/20/2022 0130   MCV 87.2 08/20/2022 0130   MCH 30.0 08/20/2022 0130   MCHC 34.4 08/20/2022 0130   RDW 12.7 08/20/2022 0130   LYMPHSABS 1.7 08/20/2022 0130   MONOABS 0.4 08/20/2022 0130   EOSABS 0.2 08/20/2022 0130   BASOSABS 0.0 08/20/2022 0130  BMET    Component Value Date/Time   NA 140 08/18/2021 1549   K 4.3 08/18/2021 1549   CL 103 08/18/2021 1549   CO2 27 04/05/2021 0345   GLUCOSE 88 08/18/2021 1549   GLUCOSE 81 04/05/2021 0345   BUN 6 08/18/2021 1549   CREATININE 0.93 08/18/2021 1549   CALCIUM 9.6 08/18/2021 1549   GFRNONAA >60 04/05/2021 0345   GFRAA >60 06/20/2019 0103    BNP No results found for: "BNP"  ProBNP No results found for: "PROBNP"  Imaging: No results found.   Assessment & Plan:   Screening for STD (sexually transmitted disease) - RPR+HIV+GC+CT Panel - Chlamydia/Gonococcus/Trichomonas, NAA  2. Depression, unspecified depression type  - Ambulatory referral to Psychiatry  Follow up:  Follow up in 3 months     Fenton Foy, NP 10/03/2022

## 2022-05-22 ENCOUNTER — Emergency Department (HOSPITAL_COMMUNITY)
Admission: EM | Admit: 2022-05-22 | Discharge: 2022-05-22 | Disposition: A | Discharge status: Home / Self Care | Payer: Medicaid Other | Attending: Emergency Medicine | Admitting: Emergency Medicine

## 2022-05-22 ENCOUNTER — Encounter (HOSPITAL_COMMUNITY): Payer: Self-pay

## 2022-05-22 DIAGNOSIS — J029 Acute pharyngitis, unspecified: Secondary | ICD-10-CM | POA: Diagnosis present

## 2022-05-22 DIAGNOSIS — Z20822 Contact with and (suspected) exposure to covid-19: Secondary | ICD-10-CM | POA: Diagnosis not present

## 2022-05-22 DIAGNOSIS — J019 Acute sinusitis, unspecified: Secondary | ICD-10-CM | POA: Diagnosis not present

## 2022-05-22 LAB — SARS CORONAVIRUS 2 BY RT PCR: SARS Coronavirus 2 by RT PCR: NEGATIVE

## 2022-05-22 LAB — CHLAMYDIA/GONOCOCCUS/TRICHOMONAS, NAA
Chlamydia by NAA: NEGATIVE
Gonococcus by NAA: NEGATIVE
Trich vag by NAA: NEGATIVE

## 2022-05-22 LAB — RPR+HIV+GC+CT PANEL
Chlamydia trachomatis, NAA: NEGATIVE
HIV Screen 4th Generation wRfx: NONREACTIVE
Neisseria Gonorrhoeae by PCR: NEGATIVE
RPR Ser Ql: NONREACTIVE

## 2022-05-22 MED ORDER — BENZONATATE 100 MG PO CAPS: 100.0000 mg | ORAL_CAPSULE | Freq: Three times a day (TID) | ORAL | 0 refills | Status: DC

## 2022-05-22 MED ORDER — FLUTICASONE PROPIONATE 50 MCG/ACT NA SUSP: 2.0000 | Freq: Every day | NASAL | 0 refills | Status: DC

## 2022-05-22 MED ORDER — FEXOFENADINE HCL 60 MG PO TABS: 60.0000 mg | ORAL_TABLET | Freq: Every day | ORAL | 0 refills | Status: DC

## 2022-05-22 NOTE — Discharge Instructions (Addendum)
Your exam today was overall reassuring.  You likely have a sinus infection.  As discussed please perform sinus rinse.  I have sent Tessalon Perles for cough, Flonase and Allegra for the sinus congestion.  For any concerning symptoms such as shortness of breath, chest pain please return to the emergency room otherwise follow-up with your primary care provider.

## 2022-05-22 NOTE — ED Triage Notes (Signed)
Patient arrived stating they smoke the vape a lot and worried about nickel poisoning. States phlegm comes up when coughing.

## 2022-05-22 NOTE — ED Provider Notes (Signed)
[facility] DEPT Provider Note   CSN: [id number] Arrival date & time: 05/22/22  0304     History  Chief Complaint  Patient presents with   Sore Throat    Dwayne Sandoval is a 26 y.o. male.  26 year old patient presents today for evaluation of productive cough over the past 2 to 3 days.  Also endorses sinus congestion, postnasal drainage.  Without chest pain, shortness of breath.  Denies fever.  The history is provided by the patient. No language interpreter was used.       Home Medications Prior to Admission medications   Medication Sig Start Date End Date Taking? Authorizing Provider  guaiFENesin (MUCINEX) 600 MG 12 hr tablet Take 1 tablet (600 mg total) by mouth 2 (two) times daily. Patient not taking: Reported on 04/13/2021 03/05/21   Hans Eden, NP  lidocaine (XYLOCAINE) 2 % solution Use as directed 15 mLs in the mouth or throat as needed for mouth pain. Patient not taking: Reported on 04/13/2021 03/05/21   Hans Eden, NP      Allergies    Patient has no known allergies.    Review of Systems   Review of Systems  Constitutional:  Negative for chills and fever.  HENT:  Positive for congestion and postnasal drip. Negative for drooling and sore throat.   Respiratory:  Positive for cough. Negative for shortness of breath.   Cardiovascular:  Negative for chest pain.  All other systems reviewed and are negative.   Physical Exam Updated Vital Signs BP 116/87 (BP Location: Left Arm)   Pulse 73   Temp 98.5 F (36.9 C) (Oral)   Resp 16   SpO2 99%  Physical Exam Vitals and nursing note reviewed.  Constitutional:      General: He is not in acute distress.    Appearance: Normal appearance. He is not ill-appearing.  HENT:     Head: Normocephalic and atraumatic.     Nose: Nose normal.  Eyes:     General: No scleral icterus.    Extraocular Movements: Extraocular movements intact.     Conjunctiva/sclera: Conjunctivae normal.   Cardiovascular:     Rate and Rhythm: Normal rate and regular rhythm.     Pulses: Normal pulses.  Pulmonary:     Effort: Pulmonary effort is normal. No respiratory distress.     Breath sounds: Normal breath sounds. No wheezing or rales.  Abdominal:     General: There is no distension.     Tenderness: There is no abdominal tenderness.  Musculoskeletal:        General: Normal range of motion.     Cervical back: Normal range of motion.  Skin:    General: Skin is warm and dry.  Neurological:     General: No focal deficit present.     Mental Status: He is alert. Mental status is at baseline.     ED Results / Procedures / Treatments   Labs (all labs ordered are listed, but only abnormal results are displayed) Labs Reviewed  SARS CORONAVIRUS 2 BY RT PCR    EKG None  Radiology No results found.  Procedures Procedures    Medications Ordered in ED Medications - No data to display  ED Course/ Medical Decision Making/ A&P                           Medical Decision Making  26 year old male presents today for evaluation of above-mentioned complaints.  Has productive cough for the past 2 to 3 days.  Afebrile.  Lung sounds are clear to auscultation.  Without chest pain.  Does have sinus congestion and postnasal drip.  Most likely patient's symptoms are due to sinus congestion.  Symptomatic management discussed.  Discussed sinus rinse, Flonase, combined with Allegra.  Patient does have PCP.  Discussed importance of follow-up with PCP.  Discussed return to the emergency room for any worsening symptoms.   Final Clinical Impression(s) / ED Diagnoses Final diagnoses:  Acute sinusitis, recurrence not specified, unspecified location    Rx / DC Orders ED Discharge Orders          Ordered    fexofenadine (ALLEGRA) 60 MG tablet  Daily        05/22/22 0507    fluticasone (FLONASE) 50 MCG/ACT nasal spray  Daily        05/22/22 0507    benzonatate (TESSALON) 100 MG capsule  Every 8  hours        05/22/22 0507              Marita Kansas, PA-C 05/22/22 0516    Sabas Sous, MD 05/22/22 [phone]

## 2022-05-25 ENCOUNTER — Ambulatory Visit (INDEPENDENT_AMBULATORY_CARE_PROVIDER_SITE_OTHER): Payer: Medicaid Other | Admitting: Clinical

## 2022-05-25 DIAGNOSIS — F32A Depression, unspecified: Secondary | ICD-10-CM | POA: Diagnosis not present

## 2022-05-29 NOTE — BH Specialist Note (Signed)
Integrated Behavioral Health Initial In-Person Visit  MRN: [id number] Name: Dwayne Sandoval  Number of Integrated Behavioral Health Clinician visits: 1- Initial Visit  Session Start time: 1515    Session End time: 1610  Total time in minutes: 55   Types of Service: Individual psychotherapy  Interpretor:No. Interpretor Name and Language: none  Subjective: [name] is a 26 y.o. male accompanied by  self. Patient was referred by Angus Seller, NP for depression. Patient reports the following symptoms/concerns: depression related to interpersonal and relationship challenges Duration of problem: a few years; Severity of problem: moderate  Objective: Mood: Euthymic and Affect: Appropriate Risk of harm to self or others: No plan to harm self or others  Life Context: Family and Social: lives with mom School/Work: works full time Self-Care:  Life Changes:   Patient and/or Family's Strengths/Protective Factors: Social connections and Concrete supports in place (healthy food, safe environments, etc.)  Goals Addressed: Patient will: Reduce symptoms of: depression Increase knowledge and/or ability of: coping skills and self-management skills  Demonstrate ability to: Increase healthy adjustment to current life circumstances  Progress towards Goals: Ongoing  Interventions: Interventions utilized: Supportive Counseling  Standardized Assessments completed: Not Needed  Patient prefers she/her pronouns. Assessment and supportive counseling today. Patient reports problems in intimate relationships, finds it difficult to connect with others in romantic relationships but has close platonic friends. Processed thoughts and emotions about self and others in these contexts. Discussed what examples of relationships patient has seen from others in her life. Emotional validation and reflective listening provided today.  Patient and/or Family Response: Patient engaged in session.   Patient  Centered Plan: Patient is on the following Treatment Plan(s):  CBT   Assessment: Patient currently experiencing depression related to interpersonal and relationship challenges.   Patient may benefit from CBT to explore thoughts and emotions about self and others. She may also benefit from assertive communication skills and connection with other social activities and contexts to increase opportunity to connect with others with common values and interests.  Plan: Follow up with behavioral health clinician on: 06/15/22 Referral(s): Integrated [facility] [facility] (In Clinic)  Abigail Butts, LCSW

## 2022-06-15 ENCOUNTER — Ambulatory Visit: Payer: Medicaid Other | Admitting: Clinical

## 2022-06-29 ENCOUNTER — Ambulatory Visit: Payer: Medicaid Other | Admitting: Clinical

## 2022-07-12 ENCOUNTER — Ambulatory Visit (INDEPENDENT_AMBULATORY_CARE_PROVIDER_SITE_OTHER): Payer: Medicaid Other | Admitting: Clinical

## 2022-07-12 DIAGNOSIS — F32A Depression, unspecified: Secondary | ICD-10-CM

## 2022-07-13 NOTE — BH Specialist Note (Signed)
Integrated Behavioral Health Follow Up In-Person Visit  MRN: [id number] Name: Dwayne Sandoval  Number of Integrated Behavioral Health Clinician visits: 2- Second Visit  Session Start time: 1425   Session End time: 1520  Total time in minutes: 55   Types of Service: Individual psychotherapy  Interpretor:No. Interpretor Name and Language: none  Subjective: Dwayne Sandoval is a 26 y.o. male accompanied by  self. Patient was referred by Angus Seller, NP for depression. Patient reports the following symptoms/concerns: depression related to interpersonal and relationship challenges Duration of problem: a few years; Severity of problem: moderate  Objective: Mood: Euthymic and Affect: Appropriate Risk of harm to self or others: No plan to harm self or others  Patient and/or Family's Strengths/Protective Factors: Social connections and Concrete supports in place (healthy food, safe environments, etc.)   Goals Addressed: Patient will:  Reduce symptoms of: depression Increase knowledge and/or ability of: coping skills and self-management skills  Demonstrate ability to: Increase healthy adjustment to current life circumstances  Progress towards Goals: Ongoing  Interventions: Interventions utilized:  CBT Cognitive Behavioral Therapy and Supportive Counseling Standardized Assessments completed: Not Needed  Supportive counseling and CBT today around intimate partner relationships. Explored patient's values related to this. Processed thoughts and emotions about self and others in relationships.  Patient and/or Family Response: Patient engaged in session.   Assessment: Patient currently experiencing depression related to interpersonal and relationship challenges.   Patient may benefit from CBT to explore thoughts and emotions about self and others. She may also benefit from assertive communication skills and connection with other social activities and contexts to increase opportunity to  connect with others with common values and interests.  Plan: Follow up with behavioral health clinician on: 07/26/22  Abigail Butts, LCSW

## 2022-07-26 ENCOUNTER — Ambulatory Visit (INDEPENDENT_AMBULATORY_CARE_PROVIDER_SITE_OTHER): Payer: Medicaid Other | Admitting: Clinical

## 2022-07-26 DIAGNOSIS — F32A Depression, unspecified: Secondary | ICD-10-CM | POA: Diagnosis not present

## 2022-07-27 NOTE — BH Specialist Note (Signed)
Integrated Behavioral Health Follow Up In-Person Visit  MRN: [id number] Name: Dwayne Sandoval  Number of [facility] Clinician visits: 3- Third Visit  Session Start time: 2707   Session End time: 8675  Total time in minutes: 55   Types of Service: Individual psychotherapy  Interpretor:No. Interpretor Name and Language: none  Subjective: Dwayne Sandoval is a 27 y.o. male accompanied by  self Patient was referred by Lazaro Arms, NP for depression. Patient reports the following symptoms/concerns: depression related to interpersonal and relationship challenges Duration of problem: a few years; Severity of problem: moderate  Objective: Mood: Euthymic and Affect: Appropriate Risk of harm to self or others: No plan to harm self or others  Patient and/or Family's Strengths/Protective Factors: Social connections and Concrete supports in place (healthy food, safe environments, etc.)   Goals Addressed: Patient will:  Reduce symptoms of: depression Increase knowledge and/or ability of: coping skills and self-management skills  Demonstrate ability to: Increase healthy adjustment to current life circumstances  Progress towards Goals: Ongoing  Interventions: Interventions utilized:  CBT Cognitive Behavioral Therapy and Supportive Counseling Standardized Assessments completed: Not Needed  CBT and supportive counseling today. Patient continues to navigate intimate partner relationships. Patient decided to disengage from several people she had been on dates with and had unhealthy dynamics with, and is considering values in interpersonal relationships before moving forward. Emotional validation and reflective listening provided.  Patient and/or Family Response: Patient engaged in session.   Assessment: Patient currently experiencing depression related to interpersonal and relationship challenges.   Patient may benefit from CBT to explore thoughts and emotions about self  and others. She may also benefit from assertive communication skills and connection with other social activities and contexts to increase opportunity to connect with others with common values and interests.  Plan: Follow up with behavioral health clinician on : 08/09/22  Estanislado Emms, LCSW

## 2022-08-09 ENCOUNTER — Ambulatory Visit: Payer: Medicaid Other | Admitting: Clinical

## 2022-08-20 ENCOUNTER — Encounter (HOSPITAL_COMMUNITY): Payer: Self-pay

## 2022-08-20 ENCOUNTER — Ambulatory Visit: Payer: Self-pay | Admitting: Nurse Practitioner

## 2022-08-20 ENCOUNTER — Other Ambulatory Visit: Payer: Self-pay

## 2022-08-20 ENCOUNTER — Emergency Department (HOSPITAL_COMMUNITY)
Admission: EM | Admit: 2022-08-20 | Discharge: 2022-08-20 | Disposition: A | Discharge status: Home / Self Care | Payer: Medicaid Other | Attending: Emergency Medicine | Admitting: Emergency Medicine

## 2022-08-20 DIAGNOSIS — I888 Other nonspecific lymphadenitis: Secondary | ICD-10-CM | POA: Diagnosis not present

## 2022-08-20 DIAGNOSIS — Z20822 Contact with and (suspected) exposure to covid-19: Secondary | ICD-10-CM | POA: Diagnosis not present

## 2022-08-20 DIAGNOSIS — L049 Acute lymphadenitis, unspecified: Secondary | ICD-10-CM | POA: Diagnosis not present

## 2022-08-20 DIAGNOSIS — J029 Acute pharyngitis, unspecified: Secondary | ICD-10-CM | POA: Diagnosis present

## 2022-08-20 DIAGNOSIS — I889 Nonspecific lymphadenitis, unspecified: Secondary | ICD-10-CM

## 2022-08-20 LAB — CBC WITH DIFFERENTIAL/PLATELET
Abs Immature Granulocytes: 0.02 10*3/uL (ref 0.00–0.07)
Basophils Absolute: 0 10*3/uL (ref 0.0–0.1)
Basophils Relative: 0 %
Eosinophils Absolute: 0.2 10*3/uL (ref 0.0–0.5)
Eosinophils Relative: 2 %
HCT: 43 % (ref 39.0–52.0)
Hemoglobin: 14.8 g/dL (ref 13.0–17.0)
Immature Granulocytes: 0 %
Lymphocytes Relative: 19 %
Lymphs Abs: 1.7 10*3/uL (ref 0.7–4.0)
MCH: 30 pg (ref 26.0–34.0)
MCHC: 34.4 g/dL (ref 30.0–36.0)
MCV: 87.2 fL (ref 80.0–100.0)
Monocytes Absolute: 0.4 10*3/uL (ref 0.1–1.0)
Monocytes Relative: 5 %
Neutro Abs: 6.6 10*3/uL (ref 1.7–7.7)
Neutrophils Relative %: 74 %
Platelets: 305 10*3/uL (ref 150–400)
RBC: 4.93 MIL/uL (ref 4.22–5.81)
RDW: 12.7 % (ref 11.5–15.5)
WBC: 9 10*3/uL (ref 4.0–10.5)
nRBC: 0 % (ref 0.0–0.2)

## 2022-08-20 LAB — GC/CHLAMYDIA PROBE AMP (~~LOC~~) NOT AT ARMC
Chlamydia: NEGATIVE
Comment: NEGATIVE
Comment: NORMAL
Neisseria Gonorrhea: POSITIVE — AB

## 2022-08-20 LAB — RESP PANEL BY RT-PCR (RSV, FLU A&B, COVID)  RVPGX2
Influenza A by PCR: NEGATIVE
Influenza B by PCR: NEGATIVE
Resp Syncytial Virus by PCR: NEGATIVE
SARS Coronavirus 2 by RT PCR: NEGATIVE

## 2022-08-20 LAB — RPR: RPR Ser Ql: NONREACTIVE

## 2022-08-20 LAB — GROUP A STREP BY PCR: Group A Strep by PCR: NOT DETECTED

## 2022-08-20 LAB — HIV ANTIBODY (ROUTINE TESTING W REFLEX): HIV Screen 4th Generation wRfx: NONREACTIVE

## 2022-08-20 MED ORDER — ACETAMINOPHEN 500 MG PO TABS
1000.0000 mg | ORAL_TABLET | Freq: Once | ORAL | Status: AC
Start: 2022-08-20 — End: 2022-08-20
  Administered 2022-08-20: 1000 mg via ORAL
  Filled 2022-08-20: qty 2

## 2022-08-20 MED ORDER — DOXYCYCLINE HYCLATE 100 MG PO CAPS: 100.0000 mg | ORAL_CAPSULE | Freq: Two times a day (BID) | ORAL | 0 refills | Status: DC

## 2022-08-20 MED ORDER — IBUPROFEN 600 MG PO TABS: 600.0000 mg | ORAL_TABLET | Freq: Four times a day (QID) | ORAL | 0 refills | Status: DC | PRN

## 2022-08-20 NOTE — ED Triage Notes (Signed)
Pt. Arrives POV c/o sore throat only when swallowing. Pt. States that he took an 800mg  ibuprofen 10 mins prior to arrival.

## 2022-08-20 NOTE — Discharge Instructions (Signed)
Your strep test came back negative.  You test negative for COVID flu and RSV.  Your neck pain is likely due to reactive lymph node.  This may indicate an infection.  Take antibiotic as prescribed, take ibuprofen or Tylenol as needed for pain.  Follow-up with your doctor for further care.

## 2022-08-20 NOTE — ED Provider Notes (Incomplete)
[facility] Provider Note   CSN: [id number] Arrival date & time: 08/20/22  2778     History {Add pertinent medical, surgical, social history, OB history to HPI:1} No chief complaint on file.   Dwayne Sandoval is a 27 y.o. male.  The history is provided by the patient and medical records. No language interpreter was used.     27 year old male significant history of high risk sexual behavior.  Presenting complaining of sore throat.  Patient report for the past 2 days he noticed an enlarged lymph node on the left side of his neck that is painful.  Increased pain with swallowing with palpation.  No fever or chills no runny nose sneezing or coughing is that his throat is not really sore.  No chest pain or shortness of breath.  Denies any ear pain no hearing changes.  No specific treatment tried.  He is sexually active with same-sex and the last oral sex was more than a week ago.  Home Medications Prior to Admission medications   Medication Sig Start Date End Date Taking? Authorizing Provider  benzonatate (TESSALON) 100 MG capsule Take 1 capsule (100 mg total) by mouth every 8 (eight) hours. 05/22/22   Evlyn Courier, PA-C  fexofenadine (ALLEGRA) 60 MG tablet Take 1 tablet (60 mg total) by mouth daily. 05/22/22 06/21/22  Deatra Canter, Amjad, PA-C  fluticasone (FLONASE) 50 MCG/ACT nasal spray Place 2 sprays into both nostrils daily. 05/22/22   Evlyn Courier, PA-C  guaiFENesin (MUCINEX) 600 MG 12 hr tablet Take 1 tablet (600 mg total) by mouth 2 (two) times daily. Patient not taking: Reported on 04/13/2021 03/05/21   Hans Eden, NP  lidocaine (XYLOCAINE) 2 % solution Use as directed 15 mLs in the mouth or throat as needed for mouth pain. Patient not taking: Reported on 04/13/2021 03/05/21   Hans Eden, NP      Allergies    Patient has no known allergies.    Review of Systems   Review of Systems  All other systems reviewed and are negative.   Physical  Exam Updated Vital Signs BP 133/83 (BP Location: Right Arm)   Pulse 81   Temp 98.4 F (36.9 C) (Oral)   Resp 18   Ht 5\' 10"  (1.778 m)   Wt 68.9 kg   SpO2 100%   BMI 21.81 kg/m  Physical Exam Vitals and nursing note reviewed.  Constitutional:      General: He is not in acute distress.    Appearance: He is well-developed.  HENT:     Head: Atraumatic.     Right Ear: Tympanic membrane normal.     Left Ear: Tympanic membrane normal.     Nose: Nose normal.     Mouth/Throat:     Mouth: Mucous membranes are moist.     Pharynx: No oropharyngeal exudate or posterior oropharyngeal erythema.  Eyes:     Conjunctiva/sclera: Conjunctivae normal.  Cardiovascular:     Rate and Rhythm: Normal rate and regular rhythm.  Pulmonary:     Effort: Pulmonary effort is normal.     Breath sounds: Normal breath sounds. No wheezing, rhonchi or rales.  Abdominal:     Palpations: Abdomen is soft.  Musculoskeletal:     Cervical back: Neck supple.  Lymphadenopathy:     Cervical: Cervical adenopathy (Reactive lymph nodes noted to left anterior chain.) present.  Skin:    Findings: No rash.  Neurological:     Mental Status: He is  alert.     ED Results / Procedures / Treatments   Labs (all labs ordered are listed, but only abnormal results are displayed) Labs Reviewed  GROUP A STREP BY PCR  RESP PANEL BY RT-PCR (RSV, FLU A&B, COVID)  [id number]    EKG None  Radiology No results found.  Procedures Procedures  {Document cardiac monitor, telemetry assessment procedure when appropriate:1}  Medications Ordered in ED Medications - No data to display  ED Course/ Medical Decision Making/ A&P   {   Click here for ABCD2, HEART and other calculatorsREFRESH Note before signing :1}                          Medical Decision Making  BP 133/83 (BP Location: Right Arm)   Pulse 81   Temp 98.4 F (36.9 C) (Oral)   Resp 18   Ht 5\' 10"  (1.778 m)   Wt 68.9 kg   SpO2 100%   BMI 21.81 kg/m   38:28  AM 27 year old male significant history of high risk sexual behavior.  Presenting complaining of sore throat.  Patient report for the past 2 days he noticed an enlarged lymph node on the left side of his neck that is painful.  Increased pain with swallowing with palpation.  No fever or chills no runny nose sneezing or coughing is that his throat is not really sore.  No chest pain or shortness of breath.  Denies any ear pain no hearing changes.  No specific treatment tried.  He is sexually active with same-sex and the last oral sex was more than a week ago.  On exam, patient appears to be in no acute discomfort.  He has normal phonation and no respiratory problems.  Ear nose and throat exam unremarkable.  Uvula is midline no tonsillar enlargement or exudates.  He does have reactive lymph nodes noted to his left anterior cervical chain.  Heart lung sounds normal.  Will obtain viral respiratory panel along with strep test.  Will check CBC to rule out leukemia.  Patient's vital signs are reassuring no fever no hypoxia.  {Document critical care time when appropriate:1} {Document review of labs and clinical decision tools ie heart score, Chads2Vasc2 etc:1}  {Document your independent review of radiology images, and any outside records:1} {Document your discussion with family members, caretakers, and with consultants:1} {Document social determinants of health affecting pt's care:1} {Document your decision making why or why not admission, treatments were needed:1} Final Clinical Impression(s) / ED Diagnoses Final diagnoses:  None    Rx / DC Orders ED Discharge Orders     None

## 2022-08-20 NOTE — ED Provider Notes (Signed)
[place] EMERGENCY DEPARTMENT AT [facility] Provider Note   CSN: [id number] Arrival date & time: 08/20/22  3810     History  No chief complaint on file.   Dwayne Sandoval is a 27 y.o. male.  The history is provided by the patient and medical records. No language interpreter was used.     27 year old male significant history of high risk sexual behavior.  Presenting complaining of sore throat.  Patient report for the past 2 days he noticed an enlarged lymph node on the left side of his neck that is painful.  Increased pain with swallowing with palpation.  No fever or chills no runny nose sneezing or coughing is that his throat is not really sore.  No chest pain or shortness of breath.  Denies any ear pain no hearing changes.  No specific treatment tried.  He is sexually active with same-sex and the last oral sex was more than a week ago.  Home Medications Prior to Admission medications   Medication Sig Start Date End Date Taking? Authorizing Provider  benzonatate (TESSALON) 100 MG capsule Take 1 capsule (100 mg total) by mouth every 8 (eight) hours. 05/22/22   Evlyn Courier, PA-C  fexofenadine (ALLEGRA) 60 MG tablet Take 1 tablet (60 mg total) by mouth daily. 05/22/22 06/21/22  Deatra Canter, Amjad, PA-C  fluticasone (FLONASE) 50 MCG/ACT nasal spray Place 2 sprays into both nostrils daily. 05/22/22   Evlyn Courier, PA-C  guaiFENesin (MUCINEX) 600 MG 12 hr tablet Take 1 tablet (600 mg total) by mouth 2 (two) times daily. Patient not taking: Reported on 04/13/2021 03/05/21   Hans Eden, NP  lidocaine (XYLOCAINE) 2 % solution Use as directed 15 mLs in the mouth or throat as needed for mouth pain. Patient not taking: Reported on 04/13/2021 03/05/21   Hans Eden, NP      Allergies    Patient has no known allergies.    Review of Systems   Review of Systems  All other systems reviewed and are negative.   Physical Exam Updated Vital Signs BP 133/83 (BP Location: Right Arm)   Pulse  81   Temp 98.4 F (36.9 C) (Oral)   Resp 18   Ht 5\' 10"  (1.778 m)   Wt 68.9 kg   SpO2 100%   BMI 21.81 kg/m  Physical Exam Vitals and nursing note reviewed.  Constitutional:      General: He is not in acute distress.    Appearance: He is well-developed.  HENT:     Head: Atraumatic.     Right Ear: Tympanic membrane normal.     Left Ear: Tympanic membrane normal.     Nose: Nose normal.     Mouth/Throat:     Mouth: Mucous membranes are moist.     Pharynx: No oropharyngeal exudate or posterior oropharyngeal erythema.  Eyes:     Conjunctiva/sclera: Conjunctivae normal.  Cardiovascular:     Rate and Rhythm: Normal rate and regular rhythm.  Pulmonary:     Effort: Pulmonary effort is normal.     Breath sounds: Normal breath sounds. No wheezing, rhonchi or rales.  Abdominal:     Palpations: Abdomen is soft.  Musculoskeletal:     Cervical back: Neck supple.  Lymphadenopathy:     Cervical: Cervical adenopathy (Reactive lymph nodes noted to left anterior chain.) present.  Skin:    Findings: No rash.  Neurological:     Mental Status: He is alert.     ED Results / Procedures /  Treatments   Labs (all labs ordered are listed, but only abnormal results are displayed) Labs Reviewed  GROUP A STREP BY PCR  RESP PANEL BY RT-PCR (RSV, FLU A&B, COVID)  RVPGX2  CBC WITH DIFFERENTIAL/PLATELET  RPR  HIV ANTIBODY (ROUTINE TESTING W REFLEX)  GC/CHLAMYDIA PROBE AMP () NOT AT [facility]    EKG None  Radiology No results found.  Procedures Procedures    Medications Ordered in ED Medications  acetaminophen (TYLENOL) tablet 1,000 mg (1,000 mg Oral Given 08/20/22 0124)    ED Course/ Medical Decision Making/ A&P                            Medical Decision Making  BP 133/83 (BP Location: Right Arm)   Pulse 81   Temp 98.4 F (36.9 C) (Oral)   Resp 18   Ht 5\' 10"  (1.778 m)   Wt 68.9 kg   SpO2 100%   BMI 21.81 kg/m   46:56 AM 27 year old male significant history of  high risk sexual behavior.  Presenting complaining of sore throat.  Patient report for the past 2 days he noticed an enlarged lymph node on the left side of his neck that is painful.  Increased pain with swallowing with palpation.  No fever or chills no runny nose sneezing or coughing is that his throat is not really sore.  No chest pain or shortness of breath.  Denies any ear pain no hearing changes.  No specific treatment tried.  He is sexually active with same-sex and the last oral sex was more than a week ago.  On exam, patient appears to be in no acute discomfort.  He has normal phonation and no respiratory problems.  Ear nose and throat exam unremarkable.  Uvula is midline no tonsillar enlargement or exudates.  He does have reactive lymph nodes noted to his left anterior cervical chain.  Heart lung sounds normal.  Will obtain viral respiratory panel along with strep test.  Will check CBC to rule out leukemia.  Patient's vital signs are reassuring no fever no hypoxia.  4:04 AM Strep test negative, negative COVID flu and RSV test.  Due to high risky sexual behavior and having vomiting, after further discussion, will discharge home with doxycycline to cover for potential bacterial infection.  Normal WBC, low suspicion for leukemia.  Ibuprofen as needed for pain.  Return precaution given.        Final Clinical Impression(s) / ED Diagnoses Final diagnoses:  Nonspecific lymphadenitis    Rx / DC Orders ED Discharge Orders          Ordered    ibuprofen (ADVIL) 600 MG tablet  Every 6 hours PRN        08/20/22 0406    doxycycline (VIBRAMYCIN) 100 MG capsule  2 times daily        08/20/22 0406              Domenic Moras, PA-C 08/20/22 0408    Molpus, Jenny Reichmann, MD 08/20/22 [phone]

## 2022-08-21 ENCOUNTER — Emergency Department (HOSPITAL_BASED_OUTPATIENT_CLINIC_OR_DEPARTMENT_OTHER)
Admission: EM | Admit: 2022-08-21 | Discharge: 2022-08-21 | Disposition: A | Discharge status: Home / Self Care | Payer: Medicaid Other | Source: Home / Self Care | Attending: Emergency Medicine | Admitting: Emergency Medicine

## 2022-08-21 ENCOUNTER — Other Ambulatory Visit: Payer: Self-pay

## 2022-08-21 ENCOUNTER — Encounter (HOSPITAL_BASED_OUTPATIENT_CLINIC_OR_DEPARTMENT_OTHER): Payer: Self-pay

## 2022-08-21 ENCOUNTER — Emergency Department (HOSPITAL_COMMUNITY)
Admission: EM | Admit: 2022-08-21 | Discharge: 2022-08-21 | Discharge status: Against Medical Advice | Payer: Medicaid Other | Attending: Emergency Medicine | Admitting: Emergency Medicine

## 2022-08-21 DIAGNOSIS — Z5321 Procedure and treatment not carried out due to patient leaving prior to being seen by health care provider: Secondary | ICD-10-CM | POA: Diagnosis not present

## 2022-08-21 DIAGNOSIS — Z7722 Contact with and (suspected) exposure to environmental tobacco smoke (acute) (chronic): Secondary | ICD-10-CM | POA: Insufficient documentation

## 2022-08-21 DIAGNOSIS — A545 Gonococcal pharyngitis: Secondary | ICD-10-CM

## 2022-08-21 DIAGNOSIS — A549 Gonococcal infection, unspecified: Secondary | ICD-10-CM | POA: Diagnosis not present

## 2022-08-21 DIAGNOSIS — Z202 Contact with and (suspected) exposure to infections with a predominantly sexual mode of transmission: Secondary | ICD-10-CM | POA: Diagnosis present

## 2022-08-21 MED ORDER — SODIUM CHLORIDE 0.9 % IV SOLN
1.0000 g | Freq: Once | INTRAVENOUS | Status: AC
  Administered 2022-08-21: 1 g via INTRAVENOUS
  Filled 2022-08-21: qty 10

## 2022-08-21 NOTE — ED Triage Notes (Signed)
Pt states his gonorrhea test resulted positive and is needing treatment.  Pt c/o pain in left side of neck with swollen lymph node.

## 2022-08-21 NOTE — ED Provider Notes (Signed)
[facility] Provider Note: Georgena Spurling, MD, FACEP  CSN: [id number] MRN: [id number] ARRIVAL: 08/21/22 at [place]: [place]  STD   HISTORY OF PRESENT ILLNESS  08/21/22 4:22 AM Uvaldo Rising Voller is a 27 y.o. male who was seen at [facility] long ED yesterday for sore throat.  He tested negative for strep, COVID, influenza and RSV.  He was placed on doxycycline for possible bacterial etiology.  Because of potentially high risk homosexual behavior he was tested orally for gonorrhea and chlamydia.  His gonorrhea test came back positive and he returns for definitive treatment.   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 No pertinent surgical history.  Family History  Problem Relation Age of Onset   Diabetes Mother    Healthy Father     Social History   Tobacco Use   Smoking status: Never    Passive exposure: Yes   Smokeless tobacco: Never  Vaping Use   Vaping Use: Every day   Substances: Nicotine, Flavoring  Substance Use Topics   Alcohol use: Yes    Comment: occ   Drug use: Not Currently    Types: Marijuana    Comment: Occasionally    Prior to Admission medications   Medication Sig Start Date End Date Taking? Authorizing Provider  benzonatate (TESSALON) 100 MG capsule Take 1 capsule (100 mg total) by mouth every 8 (eight) hours. 05/22/22   Evlyn Courier, PA-C  doxycycline (VIBRAMYCIN) 100 MG capsule Take 1 capsule (100 mg total) by mouth 2 (two) times daily. 08/20/22   Domenic Moras, PA-C  fexofenadine (ALLEGRA) 60 MG tablet Take 1 tablet (60 mg total) by mouth daily. 05/22/22 06/21/22  Deatra Canter, Amjad, PA-C  fluticasone (FLONASE) 50 MCG/ACT nasal spray Place 2 sprays into both nostrils daily. 05/22/22   Evlyn Courier, PA-C  ibuprofen (ADVIL) 600 MG tablet Take 1 tablet (600 mg total) by mouth every 6 (six) hours as needed. 08/20/22   Domenic Moras, PA-C    Allergies Patient has no known  allergies.   REVIEW OF SYSTEMS  Negative except as noted here or in the History of Present Illness.   PHYSICAL EXAMINATION  Initial Vital Signs Blood pressure (!) 134/96, pulse 76, temperature 98.5 F (36.9 C), temperature source Oral, resp. rate 18, height 5\' 10"  (1.778 m), weight 68.9 kg, SpO2 100 %.  Examination General: Well-developed, well-nourished male in no acute distress; appearance consistent with age of record HENT: normocephalic; atraumatic; mild pharyngeal erythema Eyes: Normal appearance Neck: supple; anterior cervical lymphadenopathy, left greater than right Heart: regular rate and rhythm Lungs: clear to auscultation bilaterally Abdomen: soft; nondistended; nontender; bowel sounds present Extremities: No deformity; full range of motion Neurologic: Awake, alert and oriented; motor function intact in all extremities and symmetric; no facial droop Skin: Warm and dry Psychiatric: Normal mood and affect   RESULTS  Summary of this visit's results, reviewed and interpreted by myself:   EKG Interpretation  Date/Time:    Ventricular Rate:    PR Interval:    QRS Duration:   QT Interval:    QTC Calculation:   R Axis:     Text Interpretation:         Laboratory Studies: No results found for this or any previous visit (from the past 24 hour(s)). Imaging Studies: No results found.  ED COURSE and MDM  Nursing notes, initial and subsequent vitals signs, including pulse oximetry, reviewed  and interpreted by myself.  Vitals:   08/21/22 0416 08/21/22 0418  BP:  (!) 134/96  Pulse:  76  Resp:  18  Temp:  98.5 F (36.9 C)  TempSrc:  Oral  SpO2:  100%  Weight: 68.9 kg   Height: 5\' 10"  (1.778 m)    Medications  cefTRIAXone (ROCEPHIN) 1 g in sodium chloride 0.9 % 100 mL IVPB (has no administration in time range)   Patient tested negative for chlamydia, RPR and HIV.  We will treat him with Rocephin for his gonorrhea.  He was advised to inform sexual partners  they need to be tested and possibly treated as well.   PROCEDURES  Procedures   ED DIAGNOSES     ICD-10-CM   1. Gonorrhea of oropharynx  A54.5          Tion Tse, MD 08/21/22 [phone]

## 2022-08-21 NOTE — ED Triage Notes (Signed)
Pt states exposure to Gonorrhea. Results 2/5. Seeking treatment.

## 2022-08-22 ENCOUNTER — Telehealth: Payer: Self-pay | Admitting: *Deleted

## 2022-08-22 NOTE — Patient Outreach (Signed)
  Care Coordination [facility] Note Transition Care Management Unsuccessful Follow-up Telephone Call  Date of discharge and from where:  08/21/22 from [place] ED  Attempts:  1st Attempt  Reason for unsuccessful TCM follow-up call:  Left voice message  Lurena Joiner RN, BSN [place] RN Care Coordinator

## 2022-08-24 ENCOUNTER — Ambulatory Visit: Payer: Self-pay | Admitting: Nurse Practitioner

## 2022-08-27 ENCOUNTER — Ambulatory Visit: Payer: Medicaid Other | Admitting: Clinical

## 2022-08-31 ENCOUNTER — Ambulatory Visit: Payer: Self-pay | Admitting: Nurse Practitioner

## 2022-09-04 ENCOUNTER — Ambulatory Visit (INDEPENDENT_AMBULATORY_CARE_PROVIDER_SITE_OTHER): Payer: Medicaid Other | Admitting: Clinical

## 2022-09-04 DIAGNOSIS — F32A Depression, unspecified: Secondary | ICD-10-CM

## 2022-09-04 NOTE — BH Specialist Note (Unsigned)
Integrated Behavioral Health Follow Up In-Person Visit  MRN: [id number] Name: Dwayne Sandoval  Number of [place] Clinician visits: 4- Fourth Visit  Session Start time: [id number]   Session End time: [id number]  Total time in minutes: 55   Types of Service: {CHL AMB TYPE OF [phone]}  Interpretor:{yes [id number] Interpretor Name and Language: ***  Subjective: Dwayne Sandoval is a 27 y.o. male accompanied by {Patient accompanied by:[phone]} Patient was referred by *** for ***. Patient reports the following symptoms/concerns: *** Duration of problem: ***; Severity of problem: {Mild/Moderate/Severe:20260}  Objective: Mood: {BHH MOOD:22306} and Affect: {BHH AFFECT:22307} Risk of harm to self or others: {CHL AMB BH Suicide Current Mental Status:21022748}  Life Context: Family and Social: *** School/Work: *** Self-Care: *** Life Changes: ***  Patient and/or Family's Strengths/Protective Factors: {CHL AMB BH PROTECTIVE [phone]}  Goals Addressed: Patient will:  Reduce symptoms of: {IBH Symptoms:21014056}   Increase knowledge and/or ability of: {IBH Patient Tools:21014057}   Demonstrate ability to: {IBH Goals:21014053}  Progress towards Goals: {CHL AMB BH PROGRESS TOWARDS GOALS:[phone]}  Interventions: Interventions utilized:  {IBH Interventions:21014054} Standardized Assessments completed: {IBH Screening Tools:21014051}  Patient and/or Family Response: ***  Patient Centered Plan: Patient is on the following Treatment Plan(s): *** Assessment: Patient currently experiencing ***.   Patient may benefit from ***.  Plan: Follow up with behavioral health clinician on : *** Behavioral recommendations: *** Referral(s): {IBH Referrals:21014055} "From scale of 1-10, how likely are you to follow plan?": ***  Estanislado Emms, LCSW

## 2022-09-06 ENCOUNTER — Ambulatory Visit: Payer: Self-pay | Admitting: Nurse Practitioner

## 2022-09-07 ENCOUNTER — Ambulatory Visit (INDEPENDENT_AMBULATORY_CARE_PROVIDER_SITE_OTHER): Payer: Medicaid Other | Admitting: Nurse Practitioner

## 2022-09-07 ENCOUNTER — Encounter: Payer: Self-pay | Admitting: Nurse Practitioner

## 2022-09-07 VITALS — BP 116/65 | HR 85 | Temp 97.2°F | Ht 70.0 in | Wt 158.0 lb

## 2022-09-07 DIAGNOSIS — Z113 Encounter for screening for infections with a predominantly sexual mode of transmission: Secondary | ICD-10-CM

## 2022-09-07 DIAGNOSIS — Z7252 High risk homosexual behavior: Secondary | ICD-10-CM | POA: Diagnosis not present

## 2022-09-07 NOTE — Progress Notes (Signed)
@Patient  ID: Dwayne Sandoval, male    DOB: [birth year], 27 y.o.   MRN: [id number]  Chief Complaint  Patient presents with   std screening    Referring provider: Fenton Foy, NP   HPI   Patient presents today for a follow-up visit.  He was seen in the emergency room on 08/21/2022 and was treated for gonorrhea.  He would like to be retested for STDs today.  He is not currently having any symptoms and does not have any fever. Denies f/c/s, n/v/d, hemoptysis, PND, leg swelling Denies chest pain or edema       No Known Allergies   There is no immunization history on file for this patient.  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 (Patient not taking: Reported on 09/07/2022)   doxycycline (VIBRAMYCIN) 100 MG capsule Take 1 capsule (100 mg total) by mouth 2 (two) times daily. (Patient not taking: Reported on 09/07/2022)   fexofenadine (ALLEGRA) 60 MG tablet Take 1 tablet (60 mg total) by mouth daily. (Patient not taking: Reported on 09/07/2022)   fluticasone (FLONASE) 50 MCG/ACT nasal spray Place 2 sprays into both nostrils daily. (Patient not taking: Reported on 09/07/2022)   ibuprofen (ADVIL) 600 MG tablet Take 1 tablet (600 mg total) by mouth every 6 (six) hours as needed. (Patient not taking: Reported on 09/07/2022)   No facility-administered encounter medications on file as of 09/07/2022.     Review of Systems  Review of Systems  Constitutional: Negative.   HENT: Negative.    Cardiovascular: Negative.   Gastrointestinal: Negative.   Allergic/Immunologic: Negative.   Neurological: Negative.    Psychiatric/Behavioral: Negative.         Physical Exam  BP 116/65   Pulse 85   Temp (!) 97.2 F (36.2 C)   Ht 5\' 10"  (1.778 m)   Wt 158 lb (71.7 kg)   SpO2 100%   BMI 22.67 kg/m   Wt Readings from Last 5 Encounters:  09/07/22 158 lb (71.7 kg)  08/21/22 152 lb (68.9 kg)  08/20/22 152 lb (68.9 kg)  05/18/22 155 lb (70.3 kg)  02/16/22 152 lb 4 oz (69.1 kg)     Physical Exam Vitals and nursing note reviewed.  Constitutional:      General: He is not in acute distress.    Appearance: He is well-developed.  Cardiovascular:     Rate and Rhythm: Normal rate and regular rhythm.  Pulmonary:     Effort: Pulmonary effort is normal.     Breath sounds: Normal breath sounds.  Skin:    General: Skin is warm and dry.  Neurological:     Mental Status: He is alert and oriented to person, place, and time.      Lab Results:  CBC    Component Value Date/Time   WBC 9.0 08/20/2022 0130   RBC 4.93 08/20/2022 0130   HGB 14.8 08/20/2022 0130   HCT 43.0 08/20/2022 0130   PLT 305 08/20/2022 0130   MCV 87.2 08/20/2022 0130   MCH 30.0 08/20/2022 0130   MCHC 34.4 08/20/2022 0130   RDW  12.7 08/20/2022 0130   LYMPHSABS 1.7 08/20/2022 0130   MONOABS 0.4 08/20/2022 0130   EOSABS 0.2 08/20/2022 0130   BASOSABS 0.0 08/20/2022 0130    BMET    Component Value Date/Time   NA 140 08/18/2021 1549   K 4.3 08/18/2021 1549   CL 103 08/18/2021 1549   CO2 27 04/05/2021 0345   GLUCOSE 88 08/18/2021 1549   GLUCOSE 81 04/05/2021 0345   BUN 6 08/18/2021 1549   CREATININE 0.93 08/18/2021 1549   CALCIUM 9.6 08/18/2021 1549   GFRNONAA >60 04/05/2021 0345   GFRAA >60 06/20/2019 0103    BNP No results found for: "BNP"  ProBNP No results found for: "PROBNP"  Imaging: No results found.   Assessment & Plan:   High risk sexual behavior - RPR+HIV+GC+CT Panel - GC/Chlamydia Probe Amp  2. High risk homosexual behavior  - GC/Chlamydia Probe Amp  Follow up:  Follow up as  needed     Fenton Foy, NP 09/27/2022

## 2022-09-07 NOTE — Patient Instructions (Addendum)
1. Screen for STD (sexually transmitted disease)  - RPR+HIV+GC+CT Panel - GC/Chlamydia Probe Amp  2. High risk homosexual behavior  - GC/Chlamydia Probe Amp  Follow up:  Follow up as needed

## 2022-09-10 LAB — RPR+HIV+GC+CT PANEL
Chlamydia trachomatis, NAA: NEGATIVE
HIV Screen 4th Generation wRfx: NONREACTIVE
Neisseria Gonorrhoeae by PCR: NEGATIVE
RPR Ser Ql: NONREACTIVE

## 2022-09-11 LAB — GC/CHLAMYDIA PROBE AMP

## 2022-09-11 LAB — SPECIMEN STATUS REPORT

## 2022-09-18 ENCOUNTER — Ambulatory Visit: Payer: Medicaid Other | Admitting: Clinical

## 2022-09-27 NOTE — Assessment & Plan Note (Signed)
-   RPR+HIV+GC+CT Panel - GC/Chlamydia Probe Amp  2. High risk homosexual behavior  - GC/Chlamydia Probe Amp  Follow up:  Follow up as needed

## 2022-10-03 ENCOUNTER — Encounter: Payer: Self-pay | Admitting: Nurse Practitioner

## 2022-10-03 DIAGNOSIS — Z113 Encounter for screening for infections with a predominantly sexual mode of transmission: Secondary | ICD-10-CM | POA: Insufficient documentation

## 2022-10-03 NOTE — Assessment & Plan Note (Signed)
-   RPR+HIV+GC+CT Panel - Chlamydia/Gonococcus/Trichomonas, NAA  2. Depression, unspecified depression type  - Ambulatory referral to Psychiatry  Follow up:  Follow up in 3 months

## 2022-10-03 NOTE — Patient Instructions (Signed)
1. Screening for STD (sexually transmitted disease)  - RPR+HIV+GC+CT Panel - Chlamydia/Gonococcus/Trichomonas, NAA  2. Depression, unspecified depression type  - Ambulatory referral to Psychiatry  Follow up:  Follow up in 3 months

## 2022-10-14 ENCOUNTER — Encounter (HOSPITAL_BASED_OUTPATIENT_CLINIC_OR_DEPARTMENT_OTHER): Payer: Self-pay

## 2022-10-14 ENCOUNTER — Emergency Department (HOSPITAL_BASED_OUTPATIENT_CLINIC_OR_DEPARTMENT_OTHER)
Admission: EM | Admit: 2022-10-14 | Discharge: 2022-10-14 | Disposition: A | Discharge status: Home / Self Care | Payer: Medicaid Other | Attending: Emergency Medicine | Admitting: Emergency Medicine

## 2022-10-14 DIAGNOSIS — R109 Unspecified abdominal pain: Secondary | ICD-10-CM | POA: Diagnosis present

## 2022-10-14 DIAGNOSIS — R110 Nausea: Secondary | ICD-10-CM | POA: Diagnosis not present

## 2022-10-14 DIAGNOSIS — R1084 Generalized abdominal pain: Secondary | ICD-10-CM | POA: Diagnosis not present

## 2022-10-14 LAB — CBC
HCT: 47.9 % (ref 39.0–52.0)
Hemoglobin: 16.4 g/dL (ref 13.0–17.0)
MCH: 29.9 pg (ref 26.0–34.0)
MCHC: 34.2 g/dL (ref 30.0–36.0)
MCV: 87.4 fL (ref 80.0–100.0)
Platelets: 288 10*3/uL (ref 150–400)
RBC: 5.48 MIL/uL (ref 4.22–5.81)
RDW: 12.6 % (ref 11.5–15.5)
WBC: 6 10*3/uL (ref 4.0–10.5)
nRBC: 0 % (ref 0.0–0.2)

## 2022-10-14 LAB — COMPREHENSIVE METABOLIC PANEL
ALT: 18 U/L (ref 0–44)
AST: 18 U/L (ref 15–41)
Albumin: 4.6 g/dL (ref 3.5–5.0)
Alkaline Phosphatase: 79 U/L (ref 38–126)
Anion gap: 9 (ref 5–15)
BUN: 13 mg/dL (ref 6–20)
CO2: 27 mmol/L (ref 22–32)
Calcium: 9.9 mg/dL (ref 8.9–10.3)
Chloride: 103 mmol/L (ref 98–111)
Creatinine, Ser: 0.94 mg/dL (ref 0.61–1.24)
GFR, Estimated: >60 mL/min (ref >60)
Glucose, Bld: 86 mg/dL (ref 70–99)
Potassium: 4.1 mmol/L (ref 3.5–5.1)
Sodium: 139 mmol/L (ref 135–145)
Total Bilirubin: 2.2 mg/dL — ABNORMAL HIGH (ref 0.3–1.2)
Total Protein: 7.8 g/dL (ref 6.5–8.1)

## 2022-10-14 LAB — URINALYSIS, ROUTINE W REFLEX MICROSCOPIC
Bilirubin Urine: NEGATIVE
Glucose, UA: NEGATIVE mg/dL
Hgb urine dipstick: NEGATIVE
Ketones, ur: NEGATIVE mg/dL
Leukocytes,Ua: NEGATIVE
Nitrite: NEGATIVE
Specific Gravity, Urine: 1.026 (ref 1.005–1.030)
pH: 7.5 (ref 5.0–8.0)

## 2022-10-14 LAB — LIPASE, BLOOD: Lipase: 15 U/L (ref 11–51)

## 2022-10-14 LAB — HIV ANTIBODY (ROUTINE TESTING W REFLEX): HIV Screen 4th Generation wRfx: NONREACTIVE

## 2022-10-14 MED ORDER — ONDANSETRON HCL 4 MG/2ML IJ SOLN: 4.0000 mg | Freq: Once | INTRAMUSCULAR | Status: DC

## 2022-10-14 MED ORDER — ONDANSETRON HCL 4 MG PO TABS: 4.0000 mg | ORAL_TABLET | Freq: Four times a day (QID) | ORAL | 0 refills | Status: DC

## 2022-10-14 MED ORDER — DICYCLOMINE HCL 10 MG PO CAPS: 10.0000 mg | ORAL_CAPSULE | Freq: Once | ORAL | Status: DC

## 2022-10-14 MED ORDER — LACTATED RINGERS IV BOLUS
1000.0000 mL | Freq: Once | INTRAVENOUS | Status: AC
  Administered 2022-10-14: 1000 mL via INTRAVENOUS

## 2022-10-14 NOTE — Discharge Instructions (Signed)
You were seen in the emergency department for your nausea and your abdominal pain.  You had no signs of severe dehydration and no abnormalities within your kidney, liver or pancreas and no signs of urinary tract infection.  It is unclear what is causing your symptoms at time but you may have a viral syndrome.  You can take Zofran as needed for nausea and Tylenol as needed for pain.  You can follow-up with your primary doctor in the next few days to have your symptoms rechecked.  You should return to the emergency department for fevers, repetitive vomiting despite the nausea medication, worsening abdominal pain or if you have any other new or concerning symptoms.

## 2022-10-14 NOTE — ED Triage Notes (Signed)
Pt. Tells me c/o generalized abd. Discomfort since yesterday. C/o nausea, but hasn't vomited, nor has had any diarrhea.

## 2022-10-14 NOTE — ED Notes (Signed)
Dc instructions reviewed with patient. Patient voiced understanding. Dc with belongings.  °

## 2022-10-14 NOTE — ED Provider Notes (Signed)
[facility] Provider Note   CSN: [id number] Arrival date & time: 10/14/22  1432     History  Chief Complaint  Patient presents with   Abdominal Pain    Dwayne Sandoval is a 27 y.o. male.  Patient is a 27 year old male with no significant past medical history presenting to the emergency department with abdominal pain and nausea.  The patient states that he is using a different vape than usual yesterday and at work started to feel mildly nauseous with some cramping and bloating in his abdomen.  He states that his symptoms felt worse this morning.  He states he has not vomited.  He denies any fevers or chills.  He denies any dysuria or hematuria, diarrhea or constipation.  He states that he has had a new sexual partner and is concerned for possible STD.  He denies any penile discharge or testicular pain or swelling.  The history is provided by the patient.  Abdominal Pain      Home Medications Prior to Admission medications   Medication Sig Start Date End Date Taking? Authorizing Provider  ondansetron (ZOFRAN) 4 MG tablet Take 1 tablet (4 mg total) by mouth every 6 (six) hours. 10/14/22  Yes Maylon Peppers, [place] K, DO  benzonatate (TESSALON) 100 MG capsule Take 1 capsule (100 mg total) by mouth every 8 (eight) hours. Patient not taking: Reported on 09/07/2022 05/22/22   Evlyn Courier, PA-C  doxycycline (VIBRAMYCIN) 100 MG capsule Take 1 capsule (100 mg total) by mouth 2 (two) times daily. Patient not taking: Reported on 09/07/2022 08/20/22   Domenic Moras, PA-C  fexofenadine (ALLEGRA) 60 MG tablet Take 1 tablet (60 mg total) by mouth daily. Patient not taking: Reported on 09/07/2022 05/22/22 06/21/22  Evlyn Courier, PA-C  fluticasone [facility]) 50 MCG/ACT nasal spray Place 2 sprays into both nostrils daily. Patient not taking: Reported on 09/07/2022 05/22/22   Evlyn Courier, PA-C  ibuprofen (ADVIL) 600 MG tablet Take 1 tablet (600 mg total) by mouth every 6 (six)  hours as needed. Patient not taking: Reported on 09/07/2022 08/20/22   Domenic Moras, PA-C      Allergies    Patient has no known allergies.    Review of Systems   Review of Systems  Gastrointestinal:  Positive for abdominal pain.    Physical Exam Updated Vital Signs BP 122/67   Pulse 79   Temp 99.4 F (37.4 C)   Resp 12   SpO2 96%  Physical Exam Vitals and nursing note reviewed.  Constitutional:      General: He is not in acute distress.    Appearance: He is well-developed.  HENT:     Head: Normocephalic and atraumatic.     Mouth/Throat:     Mouth: Mucous membranes are moist.     Pharynx: Oropharynx is clear.  Eyes:     Extraocular Movements: Extraocular movements intact.  Cardiovascular:     Rate and Rhythm: Normal rate and regular rhythm.     Heart sounds: Normal heart sounds.  Pulmonary:     Effort: Pulmonary effort is normal.     Breath sounds: Normal breath sounds.  Abdominal:     General: Abdomen is flat.     Palpations: Abdomen is soft.     Tenderness: There is no abdominal tenderness. There is no right CVA tenderness, left CVA tenderness, guarding or rebound.  Skin:    General: Skin is warm and dry.  Neurological:     General: No focal  deficit present.     Mental Status: He is alert and oriented to person, place, and time.  Psychiatric:        Mood and Affect: Mood normal.        Behavior: Behavior normal.     ED Results / Procedures / Treatments   Labs (all labs ordered are listed, but only abnormal results are displayed) Labs Reviewed  COMPREHENSIVE METABOLIC PANEL - Abnormal; Notable for the following components:      Result Value   Total Bilirubin 2.2 (*)    All other components within normal limits  URINALYSIS, ROUTINE W REFLEX MICROSCOPIC - Abnormal; Notable for the following components:   Protein, ur TRACE (*)    All other components within normal limits  LIPASE, BLOOD  CBC  HIV ANTIBODY (ROUTINE TESTING W REFLEX)  RPR  GC/CHLAMYDIA PROBE  AMP ([facility]) NOT AT [facility]    EKG None  Radiology No results found.  Procedures Procedures    Medications Ordered in ED Medications  ondansetron (ZOFRAN) injection 4 mg (4 mg Intravenous Not Given 10/14/22 1635)  dicyclomine (BENTYL) capsule 10 mg (10 mg Oral Not Given 10/14/22 1634)  lactated ringers bolus 1,000 mL (1,000 mLs Intravenous New Bag/Given 10/14/22 1634)    ED Course/ Medical Decision Making/ A&P Clinical Course as of 10/14/22 1827  Sun Oct 14, 2022  1822 Upon reassessment, the patient reports his symptoms have improved.  His labs showed a slight elevation of total bili but are otherwise within normal range.  The patient states that he would like to wait for his GC chlamydia results to come back prior to treatment.  He is stable for discharge home with primary care follow-up and was given strict return precautions. [VK]    Clinical Course User Index [VK] Kemper Durie, DO                             Medical Decision Making This patient presents to the ED with chief complaint(s) of abdominal pain with no pertinent past medical history which further complicates the presenting complaint. The complaint involves an extensive differential diagnosis and also carries with it a high risk of complications and morbidity.    The differential diagnosis includes STD, UTI, viral syndrome, gastroenteritis, gastritis, GERD, pancreatitis, hepatitis, other intra-abdominal infection less likely as he has no fever or point abdominal tenderness to palpation  Additional history obtained: Additional history obtained from N/A Records reviewed N/A  ED Course and Reassessment: On patient's arrival he is hemodynamically stable no acute distress.  His abdomen is soft with no point tenderness to palpation.  The patient will have labs performed and urine as well as STD testing to evaluate for cause of his pain.  He was given fluids and was offered Zofran and Bentyl however declined.  He  will be closely reassessed.  Independent labs interpretation:  The following labs were independently interpreted: Mildly elevated T. bili otherwise within normal range  Independent visualization of imaging: N/A  Consultation: - Consulted or discussed management/test interpretation w/ external professional: N/A  Consideration for admission or further workup: Patient has no emergent conditions requiring admission or further work-up at this time and is stable for discharge home with primary care follow-up  Social Determinants of health: N/A    Amount and/or Complexity of Data Reviewed Labs: ordered.  Risk Prescription drug management.          Final Clinical Impression(s) / ED Diagnoses  Final diagnoses:  Generalized abdominal pain  Nausea    Rx / DC Orders ED Discharge Orders          Ordered    ondansetron (ZOFRAN) 4 MG tablet  Every 6 hours        10/14/22 1826              Kemper Durie, DO 10/14/22 1827

## 2022-10-15 LAB — GC/CHLAMYDIA PROBE AMP (~~LOC~~) NOT AT ARMC
Chlamydia: NEGATIVE
Comment: NEGATIVE
Comment: NORMAL
Neisseria Gonorrhea: NEGATIVE

## 2022-10-15 LAB — RPR: RPR Ser Ql: NONREACTIVE

## 2022-12-06 ENCOUNTER — Encounter: Payer: Self-pay | Admitting: Nurse Practitioner

## 2022-12-06 ENCOUNTER — Ambulatory Visit (INDEPENDENT_AMBULATORY_CARE_PROVIDER_SITE_OTHER): Payer: Medicaid Other | Admitting: Nurse Practitioner

## 2022-12-06 VITALS — BP 114/70 | HR 73 | Temp 98.0°F | Wt 162.0 lb

## 2022-12-06 DIAGNOSIS — Z113 Encounter for screening for infections with a predominantly sexual mode of transmission: Secondary | ICD-10-CM | POA: Diagnosis not present

## 2022-12-06 NOTE — Progress Notes (Signed)
@Patient  ID: Dwayne Sandoval, male    DOB: 15-Sep-1995, 27 y.o.   MRN: [id number]  Chief Complaint  Patient presents with   Follow-up    Referring provider: Ivonne Andrew, NP   HPI  Patient presents today for STD screening.  Overall has been doing well since last visit.  Patient states that he is not having any symptoms at this time just needs to be checked.  No other issues or concerns today. Denies f/c/s, n/v/d, hemoptysis, PND, leg swelling Denies chest pain or edema        No Known Allergies   There is no immunization history on file for this patient.  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   doxycycline (VIBRAMYCIN) 100 MG capsule Take 1 capsule (100 mg total) by mouth 2 (two) times daily. (Patient not taking: Reported on 09/07/2022)   fexofenadine (ALLEGRA) 60 MG tablet Take 1 tablet (60 mg total) by mouth daily. (Patient not taking: Reported on 09/07/2022)   fluticasone (FLONASE) 50 MCG/ACT nasal spray Place 2 sprays into both nostrils daily. (Patient not taking: Reported on 09/07/2022)   ibuprofen (ADVIL) 600 MG tablet Take 1 tablet (600 mg total) by mouth every 6 (six) hours as needed. (Patient not taking: Reported on 09/07/2022)   ondansetron (ZOFRAN) 4 MG tablet Take 1 tablet (4 mg total) by mouth every 6 (six) hours. (Patient not taking: Reported on 12/06/2022)   [DISCONTINUED] benzonatate (TESSALON) 100 MG capsule Take 1 capsule (100 mg total) by mouth every 8 (eight) hours. (Patient not taking: Reported on 09/07/2022)   No facility-administered encounter medications on file as of 12/06/2022.      Review of Systems  Review of Systems  Constitutional: Negative.   HENT: Negative.    Cardiovascular: Negative.   Gastrointestinal: Negative.   Allergic/Immunologic: Negative.   Neurological: Negative.   Psychiatric/Behavioral: Negative.         Physical Exam  BP 114/70   Pulse 73   Temp 98 F (36.7 C)   Wt 162 lb (73.5 kg)   SpO2 97%   BMI 23.24 kg/m   Wt Readings from Last 5 Encounters:  12/06/22 162 lb (73.5 kg)  09/07/22 158 lb (71.7 kg)  08/21/22 152 lb (68.9 kg)  08/20/22 152 lb (68.9 kg)  05/18/22 155 lb (70.3 kg)     Physical Exam Vitals and nursing note reviewed.  Constitutional:      General: He is not in acute distress.    Appearance: He is well-developed.  Cardiovascular:     Rate and Rhythm: Normal rate and regular rhythm.  Pulmonary:     Effort: Pulmonary effort is normal.     Breath sounds: Normal breath sounds.  Skin:    General: Skin is warm and dry.  Neurological:     Mental Status: He is alert and oriented to person, place, and time.      Lab Results:  CBC    Component Value Date/Time   WBC 6.0 10/14/2022 1604   RBC 5.48 10/14/2022 1604   HGB 16.4 10/14/2022 1604   HCT 47.9 10/14/2022 1604  PLT 288 10/14/2022 1604   MCV 87.4 10/14/2022 1604   MCH 29.9 10/14/2022 1604   MCHC 34.2 10/14/2022 1604   RDW 12.6 10/14/2022 1604   LYMPHSABS 1.7 08/20/2022 0130   MONOABS 0.4 08/20/2022 0130   EOSABS 0.2 08/20/2022 0130   BASOSABS 0.0 08/20/2022 0130    BMET    Component Value Date/Time   NA 139 10/14/2022 1604   NA 140 08/18/2021 1549   K 4.1 10/14/2022 1604   CL 103 10/14/2022 1604   CO2 27 10/14/2022 1604   GLUCOSE 86 10/14/2022 1604   BUN 13 10/14/2022 1604   BUN 6 08/18/2021 1549   CREATININE 0.94 10/14/2022 1604   CALCIUM 9.9 10/14/2022 1604   GFRNONAA >60 10/14/2022 1604   GFRAA >60 06/20/2019 0103     Assessment & Plan:   Screen for STD (sexually transmitted disease) -  Chlamydia/Gonococcus/Trichomonas, NAA - RPR+HIV+GC+CT Panel  Follow up:  Follow up as needed     Ivonne Andrew, NP 12/06/2022

## 2022-12-06 NOTE — Patient Instructions (Signed)
1. Screen for STD (sexually transmitted disease)  - Chlamydia/Gonococcus/Trichomonas, NAA - RPR+HIV+GC+CT Panel  Follow up:  Follow up as needed

## 2022-12-06 NOTE — Assessment & Plan Note (Signed)
-   Chlamydia/Gonococcus/Trichomonas, NAA - RPR+HIV+GC+CT Panel  Follow up:  Follow up as needed

## 2022-12-09 LAB — RPR+HIV+GC+CT PANEL
Chlamydia trachomatis, NAA: NEGATIVE
HIV Screen 4th Generation wRfx: NONREACTIVE
Neisseria Gonorrhoeae by PCR: NEGATIVE
RPR Ser Ql: NONREACTIVE

## 2022-12-09 LAB — CHLAMYDIA/GONOCOCCUS/TRICHOMONAS, NAA
Chlamydia by NAA: NEGATIVE
Gonococcus by NAA: NEGATIVE
Trich vag by NAA: NEGATIVE

## 2022-12-26 DIAGNOSIS — F411 Generalized anxiety disorder: Secondary | ICD-10-CM | POA: Diagnosis not present

## 2023-01-09 ENCOUNTER — Encounter (HOSPITAL_BASED_OUTPATIENT_CLINIC_OR_DEPARTMENT_OTHER): Payer: Self-pay

## 2023-01-09 ENCOUNTER — Encounter (HOSPITAL_COMMUNITY): Payer: Self-pay | Admitting: *Deleted

## 2023-01-09 ENCOUNTER — Emergency Department (HOSPITAL_BASED_OUTPATIENT_CLINIC_OR_DEPARTMENT_OTHER)
Admission: EM | Admit: 2023-01-09 | Discharge: 2023-01-09 | Disposition: A | Discharge status: Home / Self Care | Payer: Medicaid Other | Attending: Emergency Medicine | Admitting: Emergency Medicine

## 2023-01-09 ENCOUNTER — Other Ambulatory Visit: Payer: Self-pay

## 2023-01-09 ENCOUNTER — Ambulatory Visit (HOSPITAL_COMMUNITY)
Admission: EM | Admit: 2023-01-09 | Discharge: 2023-01-09 | Disposition: A | Discharge status: Home / Self Care | Payer: Medicaid Other | Attending: Emergency Medicine | Admitting: Emergency Medicine

## 2023-01-09 DIAGNOSIS — J029 Acute pharyngitis, unspecified: Secondary | ICD-10-CM

## 2023-01-09 DIAGNOSIS — Z20822 Contact with and (suspected) exposure to covid-19: Secondary | ICD-10-CM | POA: Diagnosis not present

## 2023-01-09 DIAGNOSIS — J069 Acute upper respiratory infection, unspecified: Secondary | ICD-10-CM | POA: Diagnosis not present

## 2023-01-09 DIAGNOSIS — R509 Fever, unspecified: Secondary | ICD-10-CM | POA: Diagnosis present

## 2023-01-09 DIAGNOSIS — B9789 Other viral agents as the cause of diseases classified elsewhere: Secondary | ICD-10-CM | POA: Diagnosis not present

## 2023-01-09 LAB — GROUP A STREP BY PCR: Group A Strep by PCR: NOT DETECTED

## 2023-01-09 LAB — POCT RAPID STREP A (OFFICE): Rapid Strep A Screen: NEGATIVE

## 2023-01-09 LAB — SARS CORONAVIRUS 2 BY RT PCR: SARS Coronavirus 2 by RT PCR: NEGATIVE

## 2023-01-09 MED ORDER — CEFTRIAXONE SODIUM 500 MG IJ SOLR
INTRAMUSCULAR | Status: AC
  Filled 2023-01-09: qty 500

## 2023-01-09 MED ORDER — ACETAMINOPHEN 325 MG PO TABS
650.0000 mg | ORAL_TABLET | Freq: Once | ORAL | Status: AC | PRN
  Administered 2023-01-09: 650 mg via ORAL
  Filled 2023-01-09: qty 2

## 2023-01-09 MED ORDER — PREDNISOLONE SODIUM PHOSPHATE 15 MG/5ML PO SOLN
40.0000 mg | Freq: Once | ORAL | Status: AC
  Administered 2023-01-09: 40 mg via ORAL

## 2023-01-09 MED ORDER — PREDNISOLONE SODIUM PHOSPHATE 15 MG/5ML PO SOLN
ORAL | Status: AC
  Filled 2023-01-09: qty 3

## 2023-01-09 MED ORDER — CEFTRIAXONE SODIUM 500 MG IJ SOLR
500.0000 mg | INTRAMUSCULAR | Status: DC
  Administered 2023-01-09: 500 mg via INTRAMUSCULAR

## 2023-01-09 MED ORDER — LIDOCAINE HCL (PF) 1 % IJ SOLN
INTRAMUSCULAR | Status: AC
  Filled 2023-01-09: qty 2

## 2023-01-09 NOTE — ED Triage Notes (Signed)
Patient arrives to ED POV C/O Fever, Sore throat and headache since yesterday. States congestion, body aches and nasal drip.

## 2023-01-09 NOTE — ED Provider Notes (Signed)
[place] EMERGENCY DEPARTMENT AT [facility] Provider Note   CSN: [id number] Arrival date & time: 01/09/23  0140     History  Chief Complaint  Patient presents with   Fever   Sore Throat    Dwayne Sandoval is a 27 y.o. male.  HPI     This is a 27 year old male who presents with concern for upper respiratory symptoms.  Patient reports that he does not feel well.  He reports chills and sore throat.  Patient also reports headache and bodyaches.  No known sick contacts.  Temperature 102.4 upon arrival otherwise he did not take his temperature at home.  Patient did not take anything prior to arrival.  Denies any significant cough, chest pain, shortness of breath, abdominal pain.  Home Medications Prior to Admission medications   Medication Sig Start Date End Date Taking? Authorizing Provider  acetaminophen (TYLENOL) 500 MG tablet Take 500 mg by mouth every 6 (six) hours as needed.    [provider]  doxycycline (VIBRAMYCIN) 100 MG capsule Take 1 capsule (100 mg total) by mouth 2 (two) times daily. Patient not taking: Reported on 09/07/2022 08/20/22   Fayrene Helper, PA-C  fexofenadine (ALLEGRA) 60 MG tablet Take 1 tablet (60 mg total) by mouth daily. Patient not taking: Reported on 09/07/2022 05/22/22 06/21/22  Marita [place], PA-C  fluticasone [facility]) 50 MCG/ACT nasal spray Place 2 sprays into both nostrils daily. Patient not taking: Reported on 09/07/2022 05/22/22   Marita [place], PA-C  ibuprofen (ADVIL) 600 MG tablet Take 1 tablet (600 mg total) by mouth every 6 (six) hours as needed. Patient not taking: Reported on 09/07/2022 08/20/22   Fayrene Helper, PA-C  ondansetron (ZOFRAN) 4 MG tablet Take 1 tablet (4 mg total) by mouth every 6 (six) hours. Patient not taking: Reported on 12/06/2022 10/14/22   Elayne Snare K, DO      Allergies    Patient has no known allergies.    Review of Systems   Review of Systems  Constitutional:  Positive for chills and fever.  HENT:  Positive  for congestion and sore throat.   Respiratory:  Negative for cough and shortness of breath.   All other systems reviewed and are negative.   Physical Exam Updated Vital Signs BP 127/75 (BP Location: Right Arm)   Pulse 92   Temp (!) 102.4 F (39.1 C) (Oral)   Resp (!) 25   Ht 1.778 m (5\' 10" )   Wt 72.6 kg   SpO2 99%   BMI 22.96 kg/m  Physical Exam Vitals and nursing note reviewed.  Constitutional:      Appearance: He is well-developed. He is not ill-appearing.  HENT:     Head: Normocephalic and atraumatic.     Mouth/Throat:     Mouth: Mucous membranes are moist.     Comments: Slightly erythematous posterior oropharynx, uvula midline, no significant swelling, no exudate Eyes:     Pupils: Pupils are equal, round, and reactive to light.  Cardiovascular:     Rate and Rhythm: Normal rate and regular rhythm.     Heart sounds: Normal heart sounds. No murmur heard. Pulmonary:     Effort: Pulmonary effort is normal. No respiratory distress.     Breath sounds: Normal breath sounds. No wheezing.  Abdominal:     General: Bowel sounds are normal.     Palpations: Abdomen is soft.     Tenderness: There is no abdominal tenderness. There is no rebound.  Musculoskeletal:     Cervical  back: Neck supple.  Lymphadenopathy:     Cervical: No cervical adenopathy.  Skin:    General: Skin is warm and dry.  Neurological:     Mental Status: He is alert and oriented to person, place, and time.  Psychiatric:        Mood and Affect: Mood normal.     ED Results / Procedures / Treatments   Labs (all labs ordered are listed, but only abnormal results are displayed) Labs Reviewed  GROUP A STREP BY PCR  SARS CORONAVIRUS 2 BY RT PCR    EKG None  Radiology No results found.  Procedures Procedures    Medications Ordered in ED Medications  acetaminophen (TYLENOL) tablet 650 mg (650 mg Oral Given 01/09/23 0224)    ED Course/ Medical Decision Making/ A&P                              Medical Decision Making Risk OTC drugs.   This patient presents to the ED for concern of upper respiratory symptoms, this involves an extensive number of treatment options, and is a complaint that carries with it a high risk of complications and morbidity.  I considered the following differential and admission for this acute, potentially life threatening condition.  The differential diagnosis includes illness such as COVID or influenza, strep pharyngitis, bacterial infection  MDM:    This is a 27 year old male who presents with concern for upper respiratory symptoms and fever.  He is nontoxic.  Febrile to 102.4.  This is likely why he feels so poorly.  He was given Tylenol.  COVID testing negative.  Strep testing negative.  Likely some other respiratory virus.  Recommend supportive measures at home including hydration and Tylenol and Motrin.  (Labs, imaging, consults)  Labs: I Ordered, and personally interpreted labs.  The pertinent results include: COVID, strep  Imaging Studies ordered: I ordered imaging studies including none I independently visualized and interpreted imaging. I agree with the radiologist interpretation  Additional history obtained from chart review external records from outside source obtained and reviewed including prior evaluations  Cardiac Monitoring: The patient was maintained on a cardiac monitor.  If on the cardiac monitor, I personally viewed and interpreted the cardiac monitored which showed an underlying rhythm of: Sinus rhythm  Reevaluation: After the interventions noted above, I reevaluated the patient and found that they have :stayed the same  Social Determinants of Health:  lives independently  Disposition: Discharge  Co morbidities that complicate the patient evaluation  Past Medical History:  Diagnosis Date   Gonorrhea    [name] syndrome (HCC)      Medicines Meds ordered this encounter  Medications   acetaminophen (TYLENOL)  tablet 650 mg    I have reviewed the patients home medicines and have made adjustments as needed  Problem List / ED Course: Problem List Items Addressed This Visit   None Visit Diagnoses     Viral URI    -  Primary                   Final Clinical Impression(s) / ED Diagnoses Final diagnoses:  Viral URI    Rx / DC Orders ED Discharge Orders     None         Regan Mcbryar, Mayer Masker, MD 01/09/23 [phone]

## 2023-01-09 NOTE — ED Triage Notes (Signed)
C/O fevers up to 103.3, left sided sore throat (which is now bilat), body aches, fatigue. Denies n/v. States was in ED last night and tested neg for SARS and strep. Has been taking Tyl - last dose just PTA.

## 2023-01-09 NOTE — Discharge Instructions (Signed)
Your rapid strep was negative, we are sending this off for culture.  We have obtained screening for gonorrhea and chlamydia infections orally as well.  We have treated you in clinic with IM antibiotics and oral steroids.  Please alternate between Tylenol 500 mg and 800 mg of ibuprofen every 4-6 hours for fever, body aches and pains.  For your sore throat you can sleep with a humidifier, do warm saline gargles, tea with honey and over-the-counter lozenges cough drops.  Ensure you are drinking at least 64 ounces of water daily.  Orange juice is pretty acidic, this is probably why it hurts to drink worse than the Gatorade.  Return to clinic or follow-up with your primary care for any new or concerning symptoms.

## 2023-01-09 NOTE — Discharge Instructions (Signed)
You were seen today for upper respiratory symptoms and fever.  Your COVID and strep testing are negative.  You likely have another virus.  Take Tylenol or ibuprofen for any chills or bodyaches.  Make sure that you are staying hydrated.

## 2023-01-09 NOTE — ED Provider Notes (Addendum)
[facility]    CSN: [id number] Arrival date & time: 01/09/23  1216      History   Chief Complaint Chief Complaint  Patient presents with   Sore Throat   Fever    HPI Dwayne Sandoval is a 27 y.o. male.   Patient presents to clinic for sore throat and fevers that have been worsening. He had a fever of 103.3 today at home, took Tylenol and decided to come to the urgent care. Able to drinks Gatorade, reports OJ burns the back of his throat. Reports fatigue and body aches. Denies N/V/D or recent sick contacts. Denies cough, wheezing or SOB.   He was discharged from [facility] emergency department around 3 AM this morning, where he had negative COVID-19, flu and strep testing. They gave him tylenol, did not send him home on any medications, dx w/ viral illness.   Of note, he has had a history of oral gonorrhea infection. He has had oral intercourse with 2 male partners routinely since last getting STI testing.  Declines HIV and syphilis screening in clinic.    The history is provided by the patient and medical records.  Sore Throat Pertinent negatives include no abdominal pain and no shortness of breath.  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 No pertinent surgical history.     Home Medications    Prior to Admission medications   Medication Sig Start Date  End Date Taking? Authorizing Provider  acetaminophen (TYLENOL) 500 MG tablet Take 500 mg by mouth every 6 (six) hours as needed.   Yes [provider]  doxycycline (VIBRAMYCIN) 100 MG capsule Take 1 capsule (100 mg total) by mouth 2 (two) times daily. Patient not taking: Reported on 09/07/2022 08/20/22   Fayrene Helper, PA-C  fexofenadine (ALLEGRA) 60 MG tablet Take 1 tablet (60 mg total) by mouth daily. Patient not taking: Reported on 09/07/2022 05/22/22 06/21/22  Marita Kansas, PA-C  fluticasone [facility]) 50 MCG/ACT nasal spray Place 2 sprays into both nostrils daily. Patient not taking: Reported on 09/07/2022 05/22/22   Marita Kansas, PA-C  ibuprofen (ADVIL) 600 MG tablet Take 1 tablet (600 mg total) by mouth every 6 (six) hours as needed. Patient not taking: Reported on 09/07/2022 08/20/22   Fayrene Helper, PA-C  ondansetron (ZOFRAN) 4 MG tablet Take 1 tablet (4 mg total) by mouth every 6 (six) hours. Patient not taking: Reported on 12/06/2022 10/14/22   Rexford Maus, DO    Family History Family History  Problem Relation Age of Onset   Diabetes Mother    Healthy Father     Social History Social History   Tobacco Use   Smoking status: Never    Passive exposure: Yes   Smokeless tobacco: Never  Vaping Use   Vaping Use: Every day   Substances: Nicotine, Flavoring  Substance Use Topics   Alcohol use: Yes  Comment: occasionally   Drug use: Not Currently    Types: Marijuana    Comment: Occasionally     Allergies   Patient has no known allergies.   Review of Systems Review of Systems  Constitutional:  Positive for fever.  HENT:  Positive for sore throat and trouble swallowing.   Respiratory:  Negative for cough and shortness of breath.   Gastrointestinal:  Negative for abdominal pain, diarrhea, nausea and vomiting.     Physical Exam Triage Vital Signs ED Triage Vitals  Enc Vitals Group     BP 01/09/23 1248 114/76     Pulse Rate 01/09/23 1248 100     Resp 01/09/23 1248  (!) 26     Temp 01/09/23 1248 100.1 F (37.8 C)     Temp Source 01/09/23 1248 Oral     SpO2 01/09/23 1248 94 %     Weight --      Height --      Head Circumference --      Peak Flow --      Pain Score 01/09/23 1250 9     Pain Loc --      Pain Edu? --      Excl. in GC? --    No data found.  Updated Vital Signs BP 114/76   Pulse 100   Temp 100.1 F (37.8 C) (Oral)   Resp (!) 26   SpO2 94%   Visual Acuity Right Eye Distance:   Left Eye Distance:   Bilateral Distance:    Right Eye Near:   Left Eye Near:    Bilateral Near:     Physical Exam Vitals and nursing note reviewed.  Constitutional:      Appearance: Normal appearance. He is well-developed.  HENT:     Head: Normocephalic and atraumatic.     Right Ear: External ear normal.     Left Ear: External ear normal.     Nose: No congestion or rhinorrhea.     Mouth/Throat:     Mouth: Mucous membranes are moist.     Pharynx: Uvula midline. Posterior oropharyngeal erythema present.     Tonsils: Tonsillar exudate present. 1+ on the right. 1+ on the left.  Cardiovascular:     Rate and Rhythm: Normal rate.  Pulmonary:     Effort: Pulmonary effort is normal. No respiratory distress.  Musculoskeletal:        General: Normal range of motion.     Cervical back: Neck supple.  Lymphadenopathy:     Cervical: Cervical adenopathy present.  Skin:    General: Skin is warm.  Neurological:     General: No focal deficit present.     Mental Status: He is alert and oriented to person, place, and time.  Psychiatric:        Mood and Affect: Mood normal.        Behavior: Behavior normal.      UC Treatments / Results  Labs (all labs ordered are listed, but only abnormal results are displayed) Labs Reviewed  CULTURE, GROUP A STREP [facility])  POCT RAPID STREP A (OFFICE)  CYTOLOGY, (ORAL, ANAL, URETHRAL) ANCILLARY ONLY    EKG   Radiology No results found.  Procedures Procedures (including critical care time)  Medications  Ordered in UC Medications  cefTRIAXone (ROCEPHIN) injection 500 mg (has no administration in time range)  prednisoLONE (ORAPRED) 15 MG/5ML solution 40 mg (has no administration in time range)    Initial Impression / Assessment and Plan / UC  Course  I have reviewed the triage vital signs and the nursing notes.  Pertinent labs & imaging results that were available during my care of the patient were reviewed by me and considered in my medical decision making (see chart for details).  Vitals and triage reviewed, patient is hemodynamically stable.  Posterior pharynx with erythema, tonsillar swelling 1+ bilaterally with scattered exudate.  Uvula midline, low concern for PTA. Rapid strep negative.  Patient is homosexual and has oral intercourse with multiple partners.  History of gonorrhea infection of the throat.  Will cover with IM Rocephin, cytology swab obtained of his throat.  He declined HIV and syphilis screening.  Given oral steroids as well to help with pain and inflammation.  Symptomatic management discussed.  Plan of care, follow-up care return precautions given, no questions at this time.   Final Clinical Impressions(s) / UC Diagnoses   Final diagnoses:  Pharyngitis, unspecified etiology     Discharge Instructions      Your rapid strep was negative, we are sending this off for culture.  We have obtained screening for gonorrhea and chlamydia infections orally as well.  We have treated you in clinic with IM antibiotics and oral steroids.  Please alternate between Tylenol 500 mg and 800 mg of ibuprofen every 4-6 hours for fever, body aches and pains.  For your sore throat you can sleep with a humidifier, do warm saline gargles, tea with honey and over-the-counter lozenges cough drops.  Ensure you are drinking at least 64 ounces of water daily.  Orange juice is pretty acidic, this is probably why it hurts to drink worse than the Gatorade.  Return to clinic or follow-up with your primary  care for any new or concerning symptoms.      ED Prescriptions   None    PDMP not reviewed this encounter.      Nyssa Sayegh, [place], [place] 01/09/23 1343

## 2023-01-10 ENCOUNTER — Encounter (HOSPITAL_COMMUNITY): Payer: Self-pay | Admitting: Emergency Medicine

## 2023-01-10 ENCOUNTER — Emergency Department (HOSPITAL_COMMUNITY)
Admission: EM | Admit: 2023-01-10 | Discharge: 2023-01-10 | Disposition: A | Discharge status: Home / Self Care | Payer: Medicaid Other | Attending: Emergency Medicine | Admitting: Emergency Medicine

## 2023-01-10 DIAGNOSIS — R070 Pain in throat: Secondary | ICD-10-CM | POA: Diagnosis not present

## 2023-01-10 DIAGNOSIS — R000 Tachycardia, unspecified: Secondary | ICD-10-CM | POA: Diagnosis not present

## 2023-01-10 DIAGNOSIS — J029 Acute pharyngitis, unspecified: Secondary | ICD-10-CM

## 2023-01-10 DIAGNOSIS — J351 Hypertrophy of tonsils: Secondary | ICD-10-CM | POA: Insufficient documentation

## 2023-01-10 DIAGNOSIS — R509 Fever, unspecified: Secondary | ICD-10-CM | POA: Diagnosis not present

## 2023-01-10 DIAGNOSIS — R5081 Fever presenting with conditions classified elsewhere: Secondary | ICD-10-CM

## 2023-01-10 LAB — CBC
HCT: 46.1 % (ref 39.0–52.0)
Hemoglobin: 15.5 g/dL (ref 13.0–17.0)
MCH: 29.2 pg (ref 26.0–34.0)
MCHC: 33.6 g/dL (ref 30.0–36.0)
MCV: 87 fL (ref 80.0–100.0)
Platelets: 231 10*3/uL (ref 150–400)
RBC: 5.3 MIL/uL (ref 4.22–5.81)
RDW: 12.3 % (ref 11.5–15.5)
WBC: 9.2 10*3/uL (ref 4.0–10.5)
nRBC: 0 % (ref 0.0–0.2)

## 2023-01-10 LAB — RAPID HIV SCREEN (HIV 1/2 AB+AG)
HIV 1/2 Antibodies: NONREACTIVE
HIV-1 P24 Antigen - HIV24: NONREACTIVE
Interpretation (HIV Ag Ab): NONREACTIVE

## 2023-01-10 LAB — CYTOLOGY, (ORAL, ANAL, URETHRAL) ANCILLARY ONLY
Chlamydia: NEGATIVE
Comment: NEGATIVE
Comment: NORMAL
Neisseria Gonorrhea: NEGATIVE

## 2023-01-10 LAB — CULTURE, GROUP A STREP (THRC)

## 2023-01-10 LAB — MONONUCLEOSIS SCREEN: Mono Screen: NEGATIVE

## 2023-01-10 MED ORDER — AMOXICILLIN 500 MG PO TABS: 500.0000 mg | ORAL_TABLET | Freq: Two times a day (BID) | ORAL | 0 refills | Status: AC

## 2023-01-10 MED ORDER — LIDOCAINE VISCOUS HCL 2 % MT SOLN
15.0000 mL | Freq: Once | OROMUCOSAL | Status: AC
  Administered 2023-01-10: 15 mL via OROMUCOSAL
  Filled 2023-01-10: qty 15

## 2023-01-10 MED ORDER — IBUPROFEN 800 MG PO TABS
800.0000 mg | ORAL_TABLET | Freq: Once | ORAL | Status: AC
  Administered 2023-01-10: 800 mg via ORAL
  Filled 2023-01-10: qty 1

## 2023-01-10 MED ORDER — LACTATED RINGERS IV BOLUS
1000.0000 mL | Freq: Once | INTRAVENOUS | Status: AC
  Administered 2023-01-10: 1000 mL via INTRAVENOUS

## 2023-01-10 MED ORDER — DEXAMETHASONE SODIUM PHOSPHATE 10 MG/ML IJ SOLN
10.0000 mg | Freq: Once | INTRAMUSCULAR | Status: AC
  Administered 2023-01-10: 10 mg via INTRAVENOUS
  Filled 2023-01-10: qty 1

## 2023-01-10 NOTE — ED Provider Notes (Signed)
[place] EMERGENCY DEPARTMENT AT [facility] Provider Note   CSN: [id number] Arrival date & time: 01/10/23  1929     History  Chief Complaint  Patient presents with   Fever    Dwayne Sandoval is a 27 y.o. male here with fever, sore throat.  He says that his throat is so sore that he cannot swallow well.  He also says he has an enlarged lymph node on the left side.  He says that he has been favoring through Tylenol and ibuprofen.   See history below: He was seen initially on 6/27 at [facility] ED with upper respiratory symptoms including chills, sore throat, headache and bodyaches and was negative for COVID, rapid group A strep.  Thought to be a viral illness.  Discharged with supportive measures.  Later that day, he was seen at [facility] urgent care center for the same symptoms as well as burning in the back of the throat.  Reported unprotected oral intercourse with 2 male partners since prior STI testing.  Oral gonorrhea/chlamydia swabs returned negative.  Rocephin x 1 and Orapred x 1 administered to help with pain and inflammation.  Fever Associated symptoms: sore throat   Associated symptoms: no chest pain, no cough and no rhinorrhea        Home Medications Prior to Admission medications   Medication Sig Start Date End Date Taking? Authorizing Provider  acetaminophen (TYLENOL) 500 MG tablet Take 500 mg by mouth every 6 (six) hours as needed.    [provider]  fexofenadine (ALLEGRA) 60 MG tablet Take 1 tablet (60 mg total) by mouth daily. Patient not taking: Reported on 09/07/2022 05/22/22 06/21/22  Marita Kansas, PA-C      Allergies    Patient has no known allergies.    Review of Systems   Review of Systems  Constitutional:  Positive for fever.  HENT:  Positive for sore throat. Negative for rhinorrhea.   Respiratory:  Negative for cough, shortness of breath and wheezing.   Cardiovascular:  Negative for chest pain.  Musculoskeletal:  Negative for neck  stiffness.    Physical Exam Updated Vital Signs BP 112/66   Pulse 99   Temp (!) 103.8 F (39.9 C)   Resp 17   SpO2 97%  Physical Exam Constitutional:      General: He is not in acute distress.    Appearance: Normal appearance. He is not toxic-appearing.  HENT:     Mouth/Throat:     Mouth: Mucous membranes are moist.     Pharynx: Oropharyngeal exudate (Bilateral tonsils with hypertrophy) and posterior oropharyngeal erythema present.  Eyes:     Extraocular Movements: Extraocular movements intact.     Conjunctiva/sclera: Conjunctivae normal.     Pupils: Pupils are equal, round, and reactive to light.  Cardiovascular:     Rate and Rhythm: Normal rate and regular rhythm.     Pulses: Normal pulses.     Heart sounds: Normal heart sounds.  Pulmonary:     Effort: Pulmonary effort is normal.     Breath sounds: Normal breath sounds.  Abdominal:     General: Abdomen is flat.     Palpations: Abdomen is soft.  Musculoskeletal:     Cervical back: Normal range of motion and neck supple.  Lymphadenopathy:     Cervical: Cervical adenopathy (Cervical lymphadenopathy in anterior chain) present.  Skin:    General: Skin is warm and dry.  Neurological:     General: No focal deficit present.  Mental Status: He is alert and oriented to person, place, and time.  Psychiatric:        Mood and Affect: Mood normal.        Behavior: Behavior normal.     ED Results / Procedures / Treatments   Labs (all labs ordered are listed, but only abnormal results are displayed) Labs Reviewed - No data to display  EKG None  Radiology No results found.  Procedures Procedures    Medications Ordered in ED Medications - No data to display  ED Course/ Medical Decision Making/ A&P                             Medical Decision Making 27 year old febrile male with bilateral tonsillar exudates.  Generally well-appearing, nontoxic.  He is hemodynamically stable, but febrile to 103.8 Fahrenheit on  presentation.  Reassuringly, mononucleosis negative and HIV negative.   Symptoms most consistent with streptococcal pharyngitis, reviewed prior throat culture which showed abundant group B strep.    No concern for meningitis, pneumonia or other bacterial infection. Plan to treat clinically with amoxicillin 500 mg twice daily for 10 days.  Administered Decadron 10 mg x 1. Discharged home in stable condition.  Amount and/or Complexity of Data Reviewed Labs: ordered.  Risk Prescription drug management.          Final Clinical Impression(s) / ED Diagnoses Final diagnoses:  None    Rx / DC Orders ED Discharge Orders     None         Darral Dash, DO 01/10/23 2156    Maia Plan, MD 01/15/23 0221

## 2023-01-10 NOTE — Discharge Instructions (Addendum)
It was a pleasure caring for you today in the emergency department.  We are going to treat you with an antibiotic (amoxicillin) twice daily for 10 days for presumed strep throat.  We gave you a steroid medication at the ER.  Your labs were normal today which is good news.  Get plenty of rest over the next few days, drink plenty of fluids. Please follow up with your PCP in the next 3-5 days for a recheck. Please return to the emergency department for any worsening or worrisome symptoms.

## 2023-01-10 NOTE — ED Triage Notes (Addendum)
Pt reports fever times several days that improves with OTC antipyretic but he reports his temp never gets below 100. Was seen at [facility] and [facility] and negative for strep, covid.

## 2023-01-12 ENCOUNTER — Emergency Department (HOSPITAL_BASED_OUTPATIENT_CLINIC_OR_DEPARTMENT_OTHER): Payer: Medicaid Other

## 2023-01-12 ENCOUNTER — Encounter (HOSPITAL_COMMUNITY): Payer: Self-pay

## 2023-01-12 ENCOUNTER — Emergency Department (HOSPITAL_COMMUNITY)
Admission: EM | Admit: 2023-01-12 | Discharge: 2023-01-12 | Discharge status: Against Medical Advice | Payer: Medicaid Other | Attending: Emergency Medicine | Admitting: Emergency Medicine

## 2023-01-12 ENCOUNTER — Encounter (HOSPITAL_BASED_OUTPATIENT_CLINIC_OR_DEPARTMENT_OTHER): Payer: Self-pay

## 2023-01-12 ENCOUNTER — Other Ambulatory Visit: Payer: Self-pay

## 2023-01-12 ENCOUNTER — Emergency Department (HOSPITAL_BASED_OUTPATIENT_CLINIC_OR_DEPARTMENT_OTHER)
Admission: EM | Admit: 2023-01-12 | Discharge: 2023-01-12 | Disposition: A | Discharge status: Home / Self Care | Payer: Medicaid Other | Attending: Emergency Medicine | Admitting: Emergency Medicine

## 2023-01-12 ENCOUNTER — Emergency Department (HOSPITAL_BASED_OUTPATIENT_CLINIC_OR_DEPARTMENT_OTHER): Payer: Medicaid Other | Admitting: Radiology

## 2023-01-12 DIAGNOSIS — J039 Acute tonsillitis, unspecified: Secondary | ICD-10-CM | POA: Diagnosis not present

## 2023-01-12 DIAGNOSIS — R1310 Dysphagia, unspecified: Secondary | ICD-10-CM | POA: Diagnosis not present

## 2023-01-12 DIAGNOSIS — R509 Fever, unspecified: Secondary | ICD-10-CM | POA: Diagnosis not present

## 2023-01-12 DIAGNOSIS — R000 Tachycardia, unspecified: Secondary | ICD-10-CM | POA: Diagnosis not present

## 2023-01-12 DIAGNOSIS — G4489 Other headache syndrome: Secondary | ICD-10-CM | POA: Diagnosis not present

## 2023-01-12 DIAGNOSIS — R064 Hyperventilation: Secondary | ICD-10-CM | POA: Diagnosis not present

## 2023-01-12 DIAGNOSIS — J029 Acute pharyngitis, unspecified: Secondary | ICD-10-CM

## 2023-01-12 DIAGNOSIS — R590 Localized enlarged lymph nodes: Secondary | ICD-10-CM | POA: Diagnosis not present

## 2023-01-12 DIAGNOSIS — Z5321 Procedure and treatment not carried out due to patient leaving prior to being seen by health care provider: Secondary | ICD-10-CM | POA: Insufficient documentation

## 2023-01-12 DIAGNOSIS — R070 Pain in throat: Secondary | ICD-10-CM | POA: Diagnosis not present

## 2023-01-12 LAB — CBC WITH DIFFERENTIAL/PLATELET
Abs Immature Granulocytes: 0.01 10*3/uL (ref 0.00–0.07)
Basophils Absolute: 0 10*3/uL (ref 0.0–0.1)
Basophils Relative: 1 %
Eosinophils Absolute: 0 10*3/uL (ref 0.0–0.5)
Eosinophils Relative: 0 %
HCT: 43.2 % (ref 39.0–52.0)
Hemoglobin: 14.4 g/dL (ref 13.0–17.0)
Immature Granulocytes: 0 %
Lymphocytes Relative: 20 %
Lymphs Abs: 1.3 10*3/uL (ref 0.7–4.0)
MCH: 29.4 pg (ref 26.0–34.0)
MCHC: 33.3 g/dL (ref 30.0–36.0)
MCV: 88.3 fL (ref 80.0–100.0)
Monocytes Absolute: 0.5 10*3/uL (ref 0.1–1.0)
Monocytes Relative: 7 %
Neutro Abs: 4.6 10*3/uL (ref 1.7–7.7)
Neutrophils Relative %: 72 %
Platelets: 248 10*3/uL (ref 150–400)
RBC: 4.89 MIL/uL (ref 4.22–5.81)
RDW: 12.3 % (ref 11.5–15.5)
WBC: 6.3 10*3/uL (ref 4.0–10.5)
nRBC: 0 % (ref 0.0–0.2)

## 2023-01-12 LAB — COMPREHENSIVE METABOLIC PANEL
ALT: 14 U/L (ref 0–44)
AST: 18 U/L (ref 15–41)
Albumin: 4.1 g/dL (ref 3.5–5.0)
Alkaline Phosphatase: 52 U/L (ref 38–126)
Anion gap: 10 (ref 5–15)
BUN: 13 mg/dL (ref 6–20)
CO2: 27 mmol/L (ref 22–32)
Calcium: 9 mg/dL (ref 8.9–10.3)
Chloride: 102 mmol/L (ref 98–111)
Creatinine, Ser: 1.01 mg/dL (ref 0.61–1.24)
GFR, Estimated: >60 mL/min (ref >60)
Glucose, Bld: 104 mg/dL — ABNORMAL HIGH (ref 70–99)
Potassium: 3.8 mmol/L (ref 3.5–5.1)
Sodium: 139 mmol/L (ref 135–145)
Total Bilirubin: 0.8 mg/dL (ref 0.3–1.2)
Total Protein: 7.4 g/dL (ref 6.5–8.1)

## 2023-01-12 LAB — MONONUCLEOSIS SCREEN: Mono Screen: NEGATIVE

## 2023-01-12 LAB — LACTIC ACID, PLASMA: Lactic Acid, Venous: 1.6 mmol/L (ref 0.5–1.9)

## 2023-01-12 MED ORDER — METHYLPREDNISOLONE 4 MG PO TBPK: ORAL_TABLET | ORAL | 0 refills | Status: DC

## 2023-01-12 MED ORDER — LACTATED RINGERS IV BOLUS
1000.0000 mL | Freq: Once | INTRAVENOUS | Status: AC
  Administered 2023-01-12: 1000 mL via INTRAVENOUS

## 2023-01-12 MED ORDER — IOHEXOL 300 MG/ML  SOLN
100.0000 mL | Freq: Once | INTRAMUSCULAR | Status: AC | PRN
  Administered 2023-01-12: 75 mL via INTRAVENOUS

## 2023-01-12 MED ORDER — CLINDAMYCIN PHOSPHATE 600 MG/50ML IV SOLN
600.0000 mg | Freq: Once | INTRAVENOUS | Status: AC
  Administered 2023-01-12: 600 mg via INTRAVENOUS
  Filled 2023-01-12: qty 50

## 2023-01-12 MED ORDER — IBUPROFEN 800 MG PO TABS
800.0000 mg | ORAL_TABLET | Freq: Once | ORAL | Status: AC
  Administered 2023-01-12: 800 mg via ORAL
  Filled 2023-01-12: qty 1

## 2023-01-12 MED ORDER — DEXAMETHASONE SODIUM PHOSPHATE 10 MG/ML IJ SOLN
10.0000 mg | Freq: Once | INTRAMUSCULAR | Status: AC
  Administered 2023-01-12: 10 mg via INTRAVENOUS
  Filled 2023-01-12: qty 1

## 2023-01-12 MED ORDER — CLINDAMYCIN HCL 300 MG PO CAPS: 300.0000 mg | ORAL_CAPSULE | Freq: Three times a day (TID) | ORAL | 0 refills | Status: DC

## 2023-01-12 NOTE — ED Triage Notes (Signed)
Pt sts that fever and sore throat have been ongoing, seen 4 times with all negative results for strep, mono, covid. Started amoxicillin and took 2 doses today without improvement.

## 2023-01-12 NOTE — ED Provider Notes (Signed)
[place] EMERGENCY DEPARTMENT AT [facility] Provider Note   CSN: [id number] Arrival date & time: 01/12/23  0422     History  Chief Complaint  Patient presents with   Sore Throat    Dwayne Sandoval is a 27 y.o. male.  Patient returns with ongoing sore throat and fever.  States he has had a sore throat for the past 4 days with difficulty swallowing and having to spit in a basin.  Reports fever at home up to 103.  Pain is to both sides of his throat worse with swallowing and having difficulty eating and drinking.  No chest pain or shortness of breath.  No cough.  No abdominal pain, nausea or vomiting.  Has been using Tylenol at home without relief.  See history below: He was seen initially on 6/26 at [facility] ED with upper respiratory symptoms including chills, sore throat, headache and bodyaches and was negative for COVID, rapid group A strep.  Thought to be a viral illness.  Discharged with supportive measures.   Later that day, he was seen at [facility] urgent care center for the same symptoms as well as burning in the back of the throat.  Reported unprotected oral intercourse with 2 male partners since prior STI testing.  Oral gonorrhea/chlamydia swabs returned negative.  Rocephin x 1 and Orapred x 1 administered to help with pain and inflammation.  Patient was seen at the ED on June 27 and found to have throat culture growing strep.  He was prescribed amoxicillin and given a dose of Decadron.  He has to had 2 doses of amoxicillin. He states he is here today because the "the amoxicillin is not working". Having persistent lymph node swelling to his neck as well.  Reports he has not had oral sex in the past 2 weeks but has had multiple partners previously.  The history is provided by the patient.  Sore Throat Pertinent negatives include no chest pain, no abdominal pain, no headaches and no shortness of breath.       Home Medications Prior to Admission medications    Medication Sig Start Date End Date Taking? Authorizing Provider  acetaminophen (TYLENOL) 500 MG tablet Take 500 mg by mouth every 6 (six) hours as needed.    [provider]  amoxicillin (AMOXIL) 500 MG tablet Take 1 tablet (500 mg total) by mouth 2 (two) times daily for 10 days. 01/10/23 01/20/23  Dameron, Nolberto Hanlon, DO  fexofenadine (ALLEGRA) 60 MG tablet Take 1 tablet (60 mg total) by mouth daily. Patient not taking: Reported on 09/07/2022 05/22/22 06/21/22  Marita Kansas, PA-C      Allergies    Patient has no known allergies.    Review of Systems   Review of Systems  Constitutional:  Positive for fatigue and fever. Negative for activity change and appetite change.  HENT:  Positive for sore throat and trouble swallowing. Negative for congestion.   Respiratory:  Negative for cough, chest tightness and shortness of breath.   Cardiovascular:  Negative for chest pain.  Gastrointestinal:  Negative for abdominal pain, nausea and vomiting.  Genitourinary:  Negative for dysuria and hematuria.  Musculoskeletal:  Positive for arthralgias and myalgias.  Skin:  Negative for rash.  Neurological:  Negative for dizziness, weakness and headaches.   all other systems are negative except as noted in the HPI and PMH.    Physical Exam Updated Vital Signs BP 127/80   Pulse (!) 105   Temp (!) 103.2 F (39.6 C) (Oral)  Resp 18   Ht 5\' 10"  (1.778 m)   Wt 72.6 kg   SpO2 94%   BMI 22.97 kg/m  Physical Exam Vitals and nursing note reviewed.  Constitutional:      General: He is not in acute distress.    Appearance: He is well-developed.     Comments: Spitting in emesis bag, no distress  HENT:     Head: Normocephalic and atraumatic.     Mouth/Throat:     Pharynx: Oropharyngeal exudate and posterior oropharyngeal erythema present.     Comments: Erythematous oropharynx with bilateral tonsillar exudates.  Uvula is midline.  No asymmetry.  Floor of mouth is soft. Eyes:     Conjunctiva/sclera:  Conjunctivae normal.     Pupils: Pupils are equal, round, and reactive to light.  Neck:     Comments: No meningismus. Cardiovascular:     Rate and Rhythm: Normal rate and regular rhythm.     Heart sounds: Normal heart sounds. No murmur heard. Pulmonary:     Effort: Pulmonary effort is normal. No respiratory distress.     Breath sounds: Normal breath sounds.  Abdominal:     Palpations: Abdomen is soft.     Tenderness: There is no abdominal tenderness. There is no guarding or rebound.  Musculoskeletal:        General: No tenderness. Normal range of motion.     Cervical back: Normal range of motion and neck supple.  Lymphadenopathy:     Cervical: Cervical adenopathy present.  Skin:    General: Skin is warm.  Neurological:     Mental Status: He is alert and oriented to person, place, and time.     Cranial Nerves: No cranial nerve deficit.     Motor: No abnormal muscle tone.     Coordination: Coordination normal.     Comments: No ataxia on finger to nose bilaterally. No pronator drift. 5/5 strength throughout. CN 2-12 intact.Equal grip strength. Sensation intact.   Psychiatric:        Behavior: Behavior normal.     ED Results / Procedures / Treatments   Labs (all labs ordered are listed, but only abnormal results are displayed) Labs Reviewed  COMPREHENSIVE METABOLIC PANEL - Abnormal; Notable for the following components:      Result Value   Glucose, Bld 104 (*)    All other components within normal limits  CULTURE, BLOOD (ROUTINE X 2)  CULTURE, BLOOD (ROUTINE X 2)  CBC WITH DIFFERENTIAL/PLATELET  MONONUCLEOSIS SCREEN  LACTIC ACID, PLASMA    EKG None  Radiology CT Soft Tissue Neck W Contrast  Result Date: 01/12/2023 CLINICAL DATA:  27 year old male with multiple visits for sore throat and difficulty swallowing. Positive for Streptococcus. EXAM: CT NECK WITH CONTRAST TECHNIQUE: Multidetector CT imaging of the neck was performed using the standard protocol following the  bolus administration of intravenous contrast. RADIATION DOSE REDUCTION: This exam was performed according to the departmental dose-optimization program which includes automated exposure control, adjustment of the mA and/or kV according to patient size and/or use of iterative reconstruction technique. CONTRAST:  75mL OMNIPAQUE IOHEXOL 300 MG/ML  SOLN COMPARISON:  None Available. FINDINGS: Pharynx and larynx: Tonsillar hypertrophy with mild variegated hyperenhancement (series 2, image 56). Left tonsil appears slightly larger. But no tonsil suppuration identified. Punctate postinflammatory calcification of the left tonsil. Less pronounced adenoid and lingual tonsil hypertrophy and enhancement. No convincing parapharyngeal space inflammation. Retropharyngeal space remains normal. Epiglottis remains normal (sagittal image 54). Glottis is closed. Other laryngeal contours are within  normal limits. Salivary glands: Negative.  Negative sublingual space. Thyroid: Negative. Lymph nodes: Generally submit cm but asymmetric and enhancing left level 2 and level 3 lymph nodes (series 2, images 74 through 87. However, there are bulky bilateral level 2A lymph nodes, up to 21 mm on the left and 17 mm on the right. Small but conspicuous bilateral retropharyngeal lymph nodes appear reactive. But no cystic or necrotic nodes. Small reactive level 1A lymph nodes also. Vascular: Major vascular structures in the neck and at the skull base remain patent including both internal jugular veins. Dominant left vertebral artery. Limited intracranial: Negative. Visualized orbits: Negative. Mastoids and visualized paranasal sinuses: Visualized paranasal sinuses and mastoids are clear. 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 No other osseous abnormality identified. Upper chest: Negative. Negative visible superior mediastinum. Small visible axillary lymph nodes. IMPRESSION: 1. Acute Tonsillitis and left greater than  right reactive cervical lymphadenopathy. No abscess or other complicating features. 2. 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 Electronically Signed   By: Odessa Fleming M.D.   On: 01/12/2023 07:05   DG Chest 2 View  Result Date: 01/12/2023 CLINICAL DATA:  28 year old male with history of fever. EXAM: CHEST - 2 VIEW COMPARISON:  Chest x-ray 05/22/2011. FINDINGS: Lung volumes are normal. No consolidative airspace disease. No pleural effusions. No pneumothorax. No pulmonary nodule or mass noted. Pulmonary vasculature and the cardiomediastinal silhouette are within normal limits. IMPRESSION: No radiographic evidence of acute cardiopulmonary disease. Electronically Signed   By: Trudie Reed M.D.   On: 01/12/2023 06:46    Procedures Procedures    Medications Ordered in ED Medications  dexamethasone (DECADRON) injection 10 mg (has no administration in time range)  ibuprofen (ADVIL) tablet 800 mg (has no administration in time range)  clindamycin (CLEOCIN) IVPB 600 mg (has no administration in time range)  lactated ringers bolus 1,000 mL (has no administration in time range)    ED Course/ Medical Decision Making/ A&P                             Medical Decision Making Amount and/or Complexity of Data Reviewed Labs: ordered. Decision-making details documented in ED Course. Radiology: ordered and independent interpretation performed. Decision-making details documented in ED Course. ECG/medicine tests: ordered and independent interpretation performed. Decision-making details documented in ED Course.  Risk Prescription drug management.   Fourth visit for ongoing sore throat and fever.  Febrile to 103 on arrival.  Bilateral tonsillar exudates uvula is midline.  Patient concerned that amoxicillin was "not working".  He has only had 2 doses.  He was given IV fluids and IV steroids as well as an IV antibiotics.  Informed that his throat culture is growing beta hemolytic  strep.  IVF given as well as IV decadron and IV clindamycin.  Will obtain imaging given persistent fever, worsening sore throat and return visits.   Labs show no leukocytosis. Monospot negative. Recent HIV, oral GC/chlamydia negative.   CT shows bilateral tonsillitis without drainable abscess. No PTA or RPA. Normal epiglottis. Results reviewed and interpreted by me.   Patient tolerating PO. Fever and tachycardia improved. He is requesting a different antibiotic.  Will transition to PO clindamycin after IV dose. Also will give course of steroids to help with inflammation.   Followup with PCP and ENT. Return to the ED sooner with difficulty breathing, difficulty swallowing, unable to eat or drink, or any other concerns.  Final Clinical Impression(s) / ED Diagnoses Final diagnoses:  Pharyngitis, unspecified etiology  Tonsillitis    Rx / DC Orders ED Discharge Orders     None         Shaila Gilchrest, Jeannett Senior, MD 01/12/23 1940

## 2023-01-12 NOTE — ED Notes (Signed)
Patient tolerated PO challenge. Denies N/V.

## 2023-01-12 NOTE — Discharge Instructions (Addendum)
Take clindamycin instead of amoxicillin.  Continue your antibiotics as prescribed and take the steroids as prescribed.  Follow-up with your primary doctor as well as the ear nose and throat doctor for further assessment.  Use Tylenol or ibuprofen as needed for aches and for fevers.  Return to the ED with difficulty breathing, difficulty swallowing or any other concerns.

## 2023-01-12 NOTE — ED Triage Notes (Signed)
Pt. BIB [facility] for fever and sore throat x4 days. Pt. Was seen 2 nights ago for the same. Pt. States that his sore throat is getting worse. Pt. Was tested twice for strep and they were both neg. Pt. Had fever with EMS. Was given 650mg  tylenol with EMS.

## 2023-01-12 NOTE — ED Notes (Signed)
Pt. Left ED without being seen by a provider. Received call from [facility] stating that pt. Is now at their facility.

## 2023-01-12 NOTE — ED Notes (Signed)
Discharge instructions, follow up care, and prescriptions reviewed and explained, pt verbalized understanding and had no further questions on d/c. Pt caox4, ambulatory, NAD on d/c.  

## 2023-01-13 LAB — CULTURE, BLOOD (ROUTINE X 2)

## 2023-01-15 LAB — CULTURE, BLOOD (ROUTINE X 2): Culture: NO GROWTH

## 2023-01-16 LAB — CULTURE, BLOOD (ROUTINE X 2)

## 2023-01-17 LAB — CULTURE, BLOOD (ROUTINE X 2): Culture: NO GROWTH

## 2023-03-08 ENCOUNTER — Ambulatory Visit: Payer: Self-pay | Admitting: Nurse Practitioner

## 2023-03-21 ENCOUNTER — Ambulatory Visit: Payer: Self-pay | Admitting: Nurse Practitioner

## 2023-03-27 ENCOUNTER — Ambulatory Visit (INDEPENDENT_AMBULATORY_CARE_PROVIDER_SITE_OTHER): Payer: Self-pay | Admitting: Nurse Practitioner

## 2023-03-27 ENCOUNTER — Encounter: Payer: Self-pay | Admitting: Nurse Practitioner

## 2023-03-27 VITALS — BP 126/70 | HR 65 | Temp 98.6°F | Resp 12 | Ht 71.0 in | Wt 146.0 lb

## 2023-03-27 DIAGNOSIS — Z13220 Encounter for screening for lipoid disorders: Secondary | ICD-10-CM

## 2023-03-27 DIAGNOSIS — Z113 Encounter for screening for infections with a predominantly sexual mode of transmission: Secondary | ICD-10-CM

## 2023-03-27 NOTE — Progress Notes (Unsigned)
@Patient  ID: Dwayne Sandoval, male    DOB: 1995/10/03, 27 y.o.   MRN: [id number]  Chief Complaint  Patient presents with   Follow-up    Referring provider: Ivonne Andrew, NP   HPI  Patient presents today for routine follow-up.  Overall doing well since last visit here.  Vital signs are stable today.  Patient would like STD screening.  The lab technician has left for the day.  Patient will return for lab visit only for STD screening. Denies f/c/s, n/v/d, hemoptysis, PND, leg swelling Denies chest pain or edema     No Known Allergies   There is no immunization history on file for this patient.  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 (Patient not taking: Reported on 03/27/2023)   clindamycin (CLEOCIN) 300 MG capsule Take 1 capsule (300 mg total) by mouth 3 (three) times daily. (Patient not taking: Reported on 03/27/2023)   fexofenadine (ALLEGRA) 60 MG tablet Take 1 tablet (60 mg total) by mouth daily. (Patient not taking: Reported on 09/07/2022)   methylPREDNISolone (MEDROL DOSEPAK) 4 MG TBPK tablet As directed (Patient not taking: Reported on 03/27/2023)   No facility-administered encounter medications on file as of 03/27/2023.     Review of Systems  Review of Systems  Constitutional: Negative.   HENT: Negative.    Cardiovascular: Negative.   Gastrointestinal: Negative.   Allergic/Immunologic: Negative.   Neurological: Negative.   Psychiatric/Behavioral: Negative.         Physical Exam  BP 126/70 (BP Location: Right Arm, Patient Position: Sitting, Cuff Size: Normal)   Pulse 65   Temp 98.6 F (37 C)    Resp 12   Ht 5\' 11"  (1.803 m)   Wt 146 lb (66.2 kg)   SpO2 99%   BMI 20.36 kg/m   Wt Readings from Last 5 Encounters:  03/27/23 146 lb (66.2 kg)  01/12/23 160 lb 0.9 oz (72.6 kg)  01/12/23 160 lb (72.6 kg)  01/09/23 160 lb (72.6 kg)  12/06/22 162 lb (73.5 kg)     Physical Exam Vitals and nursing note reviewed.  Constitutional:      General: He is not in acute distress.    Appearance: He is well-developed.  Cardiovascular:     Rate and Rhythm: Normal rate and regular rhythm.  Pulmonary:     Effort: Pulmonary effort is normal.     Breath sounds: Normal breath sounds.  Skin:    General: Skin is warm and dry.  Neurological:     Mental Status: He is alert and oriented to person, place, and time.      Lab Results:  CBC    Component Value Date/Time   WBC 6.3 01/12/2023 0445   RBC 4.89 01/12/2023 0445   HGB 14.4 01/12/2023 0445   HCT 43.2 01/12/2023 0445   PLT 248 01/12/2023 0445   MCV 88.3 01/12/2023 0445   MCH 29.4 01/12/2023 0445   MCHC 33.3 01/12/2023 0445   RDW 12.3 01/12/2023 0445   LYMPHSABS 1.3 01/12/2023 0445   MONOABS 0.5 01/12/2023 0445   EOSABS 0.0 01/12/2023 0445  BASOSABS 0.0 01/12/2023 0445    BMET    Component Value Date/Time   NA 139 01/12/2023 0445   NA 140 08/18/2021 1549   K 3.8 01/12/2023 0445   CL 102 01/12/2023 0445   CO2 27 01/12/2023 0445   GLUCOSE 104 (H) 01/12/2023 0445   BUN 13 01/12/2023 0445   BUN 6 08/18/2021 1549   CREATININE 1.01 01/12/2023 0445   CALCIUM 9.0 01/12/2023 0445   GFRNONAA >60 01/12/2023 0445   GFRAA >60 06/20/2019 0103      Assessment & Plan:   Lipid screening - Lipid Panel; Future   2. Screen for STD (sexually transmitted disease)  - RPR+HIV+GC+CT Panel; Future - Chlamydia/Gonococcus/Trichomonas, NAA; Future    Follow up:  Follow up in 3 months     Ivonne Andrew, NP 03/28/2023

## 2023-03-27 NOTE — Patient Instructions (Addendum)
1. Lipid screening  - Lipid Panel; Future   2. Screen for STD (sexually transmitted disease)  - RPR+HIV+GC+CT Panel; Future - Chlamydia/Gonococcus/Trichomonas, NAA; Future    Follow up:  Follow up in 3 months

## 2023-03-28 ENCOUNTER — Encounter: Payer: Self-pay | Admitting: Nurse Practitioner

## 2023-03-28 ENCOUNTER — Other Ambulatory Visit: Payer: Medicaid Other

## 2023-03-28 DIAGNOSIS — Z113 Encounter for screening for infections with a predominantly sexual mode of transmission: Secondary | ICD-10-CM

## 2023-03-28 DIAGNOSIS — Z13220 Encounter for screening for lipoid disorders: Secondary | ICD-10-CM | POA: Diagnosis not present

## 2023-03-28 NOTE — Assessment & Plan Note (Signed)
-   Lipid Panel; Future   2. Screen for STD (sexually transmitted disease)  - RPR+HIV+GC+CT Panel; Future - Chlamydia/Gonococcus/Trichomonas, NAA; Future    Follow up:  Follow up in 3 months

## 2023-03-29 LAB — LIPID PANEL
Chol/HDL Ratio: 3 ratio (ref 0.0–5.0)
Cholesterol, Total: 151 mg/dL (ref 100–199)
HDL: 50 mg/dL (ref >39)
LDL Chol Calc (NIH): 88 mg/dL (ref 0–99)
Triglycerides: 66 mg/dL (ref 0–149)
VLDL Cholesterol Cal: 13 mg/dL (ref 5–40)

## 2023-03-31 LAB — RPR+HIV+GC+CT PANEL
Chlamydia trachomatis, NAA: NEGATIVE
HIV Screen 4th Generation wRfx: NONREACTIVE
Neisseria Gonorrhoeae by PCR: NEGATIVE
RPR Ser Ql: NONREACTIVE

## 2023-03-31 LAB — CHLAMYDIA/GONOCOCCUS/TRICHOMONAS, NAA
Chlamydia by NAA: NEGATIVE
Gonococcus by NAA: NEGATIVE
Trich vag by NAA: NEGATIVE

## 2023-04-08 ENCOUNTER — Other Ambulatory Visit: Payer: Self-pay | Admitting: Nurse Practitioner

## 2023-04-08 ENCOUNTER — Ambulatory Visit: Payer: Medicaid Other

## 2023-04-08 DIAGNOSIS — Z113 Encounter for screening for infections with a predominantly sexual mode of transmission: Secondary | ICD-10-CM

## 2023-04-08 DIAGNOSIS — Z206 Contact with and (suspected) exposure to human immunodeficiency virus [HIV]: Secondary | ICD-10-CM

## 2023-04-10 ENCOUNTER — Encounter: Payer: Self-pay | Admitting: Infectious Diseases

## 2023-04-10 ENCOUNTER — Other Ambulatory Visit: Payer: Self-pay

## 2023-04-10 ENCOUNTER — Ambulatory Visit (INDEPENDENT_AMBULATORY_CARE_PROVIDER_SITE_OTHER): Payer: Medicaid Other | Admitting: Infectious Diseases

## 2023-04-10 VITALS — BP 123/90 | HR 97 | Temp 98.7°F | Ht 70.0 in | Wt 146.0 lb

## 2023-04-10 DIAGNOSIS — Z206 Contact with and (suspected) exposure to human immunodeficiency virus [HIV]: Secondary | ICD-10-CM

## 2023-04-10 DIAGNOSIS — Z7252 High risk homosexual behavior: Secondary | ICD-10-CM

## 2023-04-10 NOTE — Progress Notes (Signed)
Subjective:     Dwayne Sandoval is a 27 y.o. adult patient here today with concern over HIV sexual exposure.   She is sexually active with males (verse contact, inconsistent condom use). Routinely goes for testing every 3 months - last was 03/27/2023 with non-reactive HIV, RPR and GC/CT urine testing. She had sexual contact with a familiar partner Monday 04/01/2023 w/o condom use (oral and insertive, no receptive contact) - notified by this partner Monday 9/23 that he tested positive for HIV.   She is very worried and hopes we can help her on preventative medication to help lessen the risk of transmission. She has not noticed any symptoms but worries she has swollen glands in throat.   H/O disseminated gonococcal infection in 2020, HMPX in 2022 He has met with our team a few times for these problems in the past and at each encounter we discussed PREP-- attempted to get her on Descovy Jan 2020 but did not follow up.    Review of Systems: Review of Systems  Constitutional:  Negative for chills and fever.  HENT:  Negative for sore throat.   Eyes:  Negative for visual disturbance.  Gastrointestinal:  Negative for abdominal pain, anal bleeding and rectal pain.  Genitourinary:  Negative for dysuria, genital sores, penile discharge, penile pain, scrotal swelling and testicular pain.  Musculoskeletal:  Negative for arthralgias and joint swelling.  Skin:  Negative for rash.  Neurological:  Negative for headaches.  Hematological:  Negative for adenopathy.     Sexual History:  Prefers male partners, oropharyngeal, rectal and penile sites exposed Condom use inconsistent PrEP?: no  Past Medical History:  Diagnosis Date   Gonorrhea    Dwayne Sandoval [facility])     Outpatient Medications Prior to Visit  Medication Sig Dispense Refill   acetaminophen (TYLENOL) 500 MG tablet Take 500 mg by mouth every 6 (six) hours as needed. (Patient not taking: Reported on 03/27/2023)      clindamycin (CLEOCIN) 300 MG capsule Take 1 capsule (300 mg total) by mouth 3 (three) times daily. (Patient not taking: Reported on 03/27/2023) 21 capsule 0   fexofenadine (ALLEGRA) 60 MG tablet Take 1 tablet (60 mg total) by mouth daily. (Patient not taking: Reported on 09/07/2022) 30 tablet 0   methylPREDNISolone (MEDROL DOSEPAK) 4 MG TBPK tablet As directed (Patient not taking: Reported on 03/27/2023) 21 each 0   No facility-administered medications prior to visit.    No Known Allergies  Family History  Problem Relation Age of Onset   Diabetes Mother    Healthy Father         Objective:  Objective  Vitals:   04/10/23 1513  BP: (!) 123/90  Pulse: 97  Temp: 98.7 F (37.1 C)  SpO2: 99%   Body mass index is 20.95 kg/m.   Physical Exam  Physical Exam Constitutional:      Appearance: He is well-developed.  HENT:     Mouth/Throat:     Mouth: No oral lesions.     Dentition: Normal dentition. No dental abscesses.     Pharynx: No oropharyngeal exudate.  Cardiovascular:     Rate and Rhythm: Normal rate and regular rhythm.     Heart sounds: Normal heart sounds.  Pulmonary:     Effort: Pulmonary effort is normal.     Breath sounds: Normal breath sounds.  Abdominal:     General: There is no distension.     Palpations: Abdomen is soft.  Tenderness: There is no abdominal tenderness.  Musculoskeletal:     Cervical back: Full passive range of motion without pain and normal range of motion. No rigidity.  Lymphadenopathy:     Head:     Right side of head: No submental, submandibular, tonsillar, preauricular, posterior auricular or occipital adenopathy.     Left side of head: No submental, submandibular, tonsillar, preauricular, posterior auricular or occipital adenopathy.     Cervical: No cervical adenopathy.     Right cervical: No superficial, deep or posterior cervical adenopathy.    Left cervical: No superficial, deep or posterior cervical adenopathy.     Upper Body:      Right upper body: No supraclavicular, axillary or epitrochlear adenopathy.     Left upper body: No supraclavicular, axillary or epitrochlear adenopathy.  Skin:    General: Skin is warm and dry.     Findings: No rash.  Neurological:     Mental Status: He is alert and oriented to person, place, and time.  Psychiatric:        Behavior: Behavior normal.        Thought Content: Thought content normal.        Judgment: Judgment normal.        Assessment & Plan:    Problem List Items Addressed This Visit       Unprioritized   High risk sexual behavior    If it turns out she is HIV negative - she really needs to consider PrEP long term . We discussed the various options out now and those to come for treatment.  Further recommendations pending labs.       HIV exposure - Primary    [name] is here today for evaluation after HIV sexual exposure (insertive/oral contact) on Monday 9/16. Unfortunately she did not receive notice until 9/23 and today at the time of our evaluation is already 10 days out from exposure.  There is no benefit of PEP treatment after 72 hours, and this will offer unnecessary side effects and delay of diagnosis for her ultimately. To complicate things further, she remains in the eclipse phase so HIV viral load testing today may still be negative.  Will have her back in 1 week with pharmacy team to repeat HIV viral load test. I have asked her to please remain abstinent until we have further testing to determine additional recommendations.   If she continues to test negative through day 21 with viral load I think we are safe to say there was no sexual transmission and can start her on PrEP.         Relevant Orders   HIV 1 RNA quant-no reflex-bld    [name], MSN, NP-C [facility] Office: [phone] Pager: [phone]

## 2023-04-10 NOTE — Patient Instructions (Signed)
Follow up in 1 week with Marchelle Folks.

## 2023-04-11 LAB — RPR+HIV+GC+CT PANEL
HIV Screen 4th Generation wRfx: NONREACTIVE
RPR Ser Ql: NONREACTIVE

## 2023-04-11 LAB — CHLAMYDIA/GONOCOCCUS/TRICHOMONAS, NAA
Chlamydia by NAA: NEGATIVE
Gonococcus by NAA: NEGATIVE
Trich vag by NAA: NEGATIVE

## 2023-04-12 DIAGNOSIS — Z206 Contact with and (suspected) exposure to human immunodeficiency virus [HIV]: Secondary | ICD-10-CM | POA: Insufficient documentation

## 2023-04-12 NOTE — Assessment & Plan Note (Signed)
Dwayne Sandoval is here today for evaluation after HIV sexual exposure (insertive/oral contact) on Monday 9/16. Unfortunately she did not receive notice until 9/23 and today at the time of our evaluation is already 10 days out from exposure.  There is no benefit of PEP treatment after 72 hours, and this will offer unnecessary side effects and delay of diagnosis for her ultimately. To complicate things further, she remains in the eclipse phase so HIV viral load testing today may still be negative.  Will have her back in 1 week with pharmacy team to repeat HIV viral load test. I have asked her to please remain abstinent until we have further testing to determine additional recommendations.   If she continues to test negative through day 21 with viral load I think we are safe to say there was no sexual transmission and can start her on PrEP.

## 2023-04-12 NOTE — Assessment & Plan Note (Signed)
If it turns out she is HIV negative - she really needs to consider PrEP long term . We discussed the various options out now and those to come for treatment.  Further recommendations pending labs.

## 2023-04-13 LAB — HIV-1 RNA QUANT-NO REFLEX-BLD
HIV 1 RNA Quant: NOT DETECTED {copies}/mL
HIV-1 RNA Quant, Log: NOT DETECTED {Log}

## 2023-04-15 NOTE — Progress Notes (Unsigned)
HPI: Dwayne Sandoval is a 27 y.o. adult who presents to the [facility] pharmacy clinic for HIV exposure follow-up.  Patient Active Problem List   Diagnosis Date Noted   HIV exposure 04/12/2023   Lipid screening 03/28/2023   Screen for STD (sexually transmitted disease) 12/06/2022   Screening for STD (sexually transmitted disease) 10/03/2022   Routine screening for STI (sexually transmitted infection) 02/16/2022   Monkeypox 04/13/2021   H/O gonorrhea 04/13/2021   High risk sexual behavior 07/30/2018    Patient's Medications  New Prescriptions   No medications on file  Previous Medications   ACETAMINOPHEN (TYLENOL) 500 MG TABLET    Take 500 mg by mouth every 6 (six) hours as needed.   CLINDAMYCIN (CLEOCIN) 300 MG CAPSULE    Take 1 capsule (300 mg total) by mouth 3 (three) times daily.   FEXOFENADINE (ALLEGRA) 60 MG TABLET    Take 1 tablet (60 mg total) by mouth daily.   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[facility])     Social History: Social History   Socioeconomic History   Marital status: Single    Spouse name: Not on file   Number of children: Not on file   Years of education: Not on file   Highest education level: Not on file  Occupational History   Not on file  Tobacco Use   Smoking status: Never    Passive exposure: Yes   Smokeless tobacco: Never  Vaping Use   Vaping status: Every Day   Substances: Nicotine, Flavoring  Substance and Sexual Activity   Alcohol use: Yes    Comment: occasionally   Drug use: Not Currently    Types: Marijuana    Comment: Occasionally   Sexual activity: Not on file  Other Topics Concern   Not on file  Social History Narrative   Not on file   Social Determinants of Health    Financial Resource Strain: Not on file  Food Insecurity: Not on file  Transportation Needs: Not on file  Physical Activity: Not on file  Stress: Not on file  Social Connections: Not on file    Labs: Lab Results  Component Value Date   HIV1RNAQUANT Not Detected 04/10/2023   HIV1RNAQUANT <20 07/21/2018    RPR and STI Lab Results  Component Value Date   LABRPR Non Reactive 04/08/2023   LABRPR Non Reactive 03/28/2023   LABRPR Non Reactive 12/06/2022   LABRPR NON REACTIVE 10/14/2022   LABRPR Non Reactive 09/07/2022    STI Results GC CT  Latest Ref Rng & Units  Negative  04/08/2023  3:21 PM  Negative   03/28/2023  3:03 PM  Negative   01/09/2023  1:30 PM Negative  Negative   12/06/2022  3:47 PM  Negative   10/14/2022  3:55 PM Negative  Negative   09/07/2022  4:25 PM  CANCELED   09/07/2022  4:17 PM  Negative   08/20/2022  1:31 AM Positive  Negative   05/18/2022  4:27 PM  Negative   02/16/2022  2:46 PM  Negative   11/17/2021  3:30 PM  Negative   11/02/2021  3:32 AM Negative  Negative   03/31/2021  5:30 AM Negative  Negative  02/27/2020  6:22 PM Negative    Negative  Negative    Negative   10/15/2019 12:28 PM Positive    Negative  Negative    Negative   06/20/2019 12:00 AM Negative  Negative   04/30/2019 12:00 AM **POSITIVE**  Negative   01/23/2019 12:00 AM Negative  Negative   07/21/2018 12:00 AM **POSITIVE**    Negative  Negative    Negative     Hepatitis B Lab Results  Component Value Date   HEPBSAG Negative 09/21/2020   Hepatitis C No results found for: "HEPCAB", "HCVRNAPCRQN" Hepatitis A No results found for: "HAV" Lipids: Lab Results  Component Value Date   CHOL 151 03/28/2023   TRIG 66 03/28/2023   HDL 50 03/28/2023   CHOLHDL 3.0 03/28/2023   LDLCALC 88 03/28/2023    Assessment: Dwayne Sandoval presents to clinic today after an HIV exposure on 04/01/2023 with a familiar sexual partner; she was not informed of her partner's positive HIV status until  04/08/2023 which was outside of the 72-hour window to start PEP. She met with Dwayne Sandoval on 04/10/2023 for HIV testing which returned negative. Given she is technically in the eclipse phase between HIV exposure and an initial positive HIV RNA, will repeat viral load today and then again with Dwayne Sandoval next week. Next week with Dwayne Sandoval would mark > 21 days since exposure which is when an HIV viral load would most likely become positive.   Dwayne Sandoval encouraged her to remain abstinent until her final testing is done next week; she confirms she has remained abstinent. Will schedule follow up with Dwayne Sandoval and discuss PrEP in further detail. Encouraged her to start PrEP after her next appointment with Dwayne Sandoval as she is active with multiple partners and has inconsistent condom use.   Plan: - Check HIV RNA - Follow up with Dwayne Sandoval on ***  Margarite Gouge, PharmD, CPP, BCIDP, AAHIVP Clinical Pharmacist Practitioner Infectious Diseases Clinical Pharmacist [facility] 04/15/2023, 3:30 PM

## 2023-04-17 ENCOUNTER — Telehealth: Payer: Self-pay

## 2023-04-17 ENCOUNTER — Ambulatory Visit (INDEPENDENT_AMBULATORY_CARE_PROVIDER_SITE_OTHER): Payer: Medicaid Other | Admitting: Pharmacist

## 2023-04-17 ENCOUNTER — Other Ambulatory Visit: Payer: Self-pay

## 2023-04-17 ENCOUNTER — Other Ambulatory Visit (HOSPITAL_COMMUNITY): Payer: Self-pay

## 2023-04-17 VITALS — Wt 146.6 lb

## 2023-04-17 DIAGNOSIS — Z206 Contact with and (suspected) exposure to human immunodeficiency virus [HIV]: Secondary | ICD-10-CM

## 2023-04-17 DIAGNOSIS — Z79899 Other long term (current) drug therapy: Secondary | ICD-10-CM | POA: Diagnosis not present

## 2023-04-17 MED ORDER — DESCOVY 200-25 MG PO TABS
1.0000 | ORAL_TABLET | Freq: Every day | ORAL | 2 refills | Status: DC
Start: 2023-04-17 — End: 2023-11-04
  Filled 2023-04-18: qty 30, 30d supply, fill #0
  Filled 2023-06-04: qty 30, 30d supply, fill #1
  Filled 2023-08-06: qty 30, 30d supply, fill #2

## 2023-04-17 NOTE — Telephone Encounter (Signed)
[facility] Patient Advocate Encounter  Insurance verification completed.    The patient is insured through [facility] MEDICAID. Patient has Medicare and is not eligible for a copay card, but may be able to apply for patient assistance, if available.    Ran test claim for TRUVADA & DESCOVY  The current 30 day co-pay is $0 .   We will continue to follow to see if copay assistance is needed.  This test claim was processed through [facility]- copay amounts may vary at other pharmacies due to pharmacy/plan contracts, or as the patient moves through the different stages of their insurance plan.

## 2023-04-18 ENCOUNTER — Other Ambulatory Visit: Payer: Self-pay

## 2023-04-18 ENCOUNTER — Other Ambulatory Visit: Payer: Self-pay | Admitting: Pharmacist

## 2023-04-18 ENCOUNTER — Other Ambulatory Visit (HOSPITAL_COMMUNITY): Payer: Self-pay

## 2023-04-18 NOTE — Progress Notes (Signed)
Specialty Pharmacy Initial Fill Coordination Note  Dwayne Sandoval is a 27 y.o. adult contacted today regarding refills of specialty medication(s) Emtricitabine-Tenofovir Af   Patient requested Delivery   Delivery date: 04/19/23   Verified address: [address] [name] [address]   Medication will be filled on 04/18/23.   Patient is aware of $0 copayment.

## 2023-04-18 NOTE — Progress Notes (Signed)
Specialty Pharmacy Initiation Note   Dwayne Sandoval is a 27 y.o. adult who will be followed by the specialty pharmacy service for RxSp HIV    Review of administration, indication, effectiveness, safety, potential side effects, storage/disposable, and missed dose instructions occurred today for patient's specialty medication(s) Emtricitabine-Tenofovir Af     Patient did not have any additional questions or concerns.   Patient's therapy is appropriate to: Initiate    Goals Addressed             This Visit's Progress    Maintain optimal adherence to therapy       Patient is initiating therapy. Patient will be evaluated at upcoming provider appointment to assess progress      Practice safe sex       Patient is on track. Patient will be evaluated at upcoming provider appointment to assess progress

## 2023-04-19 LAB — HIV-1 RNA QUANT-NO REFLEX-BLD
HIV 1 RNA Quant: NOT DETECTED {copies}/mL
HIV-1 RNA Quant, Log: NOT DETECTED {Log}

## 2023-04-19 LAB — HEPATITIS B SURFACE ANTIGEN: Hepatitis B Surface Ag: NONREACTIVE

## 2023-04-23 ENCOUNTER — Other Ambulatory Visit (HOSPITAL_COMMUNITY): Payer: Self-pay

## 2023-04-23 ENCOUNTER — Other Ambulatory Visit: Payer: Self-pay

## 2023-04-24 ENCOUNTER — Other Ambulatory Visit (HOSPITAL_COMMUNITY): Payer: Self-pay

## 2023-04-24 ENCOUNTER — Ambulatory Visit: Payer: Self-pay | Admitting: Nurse Practitioner

## 2023-04-24 ENCOUNTER — Other Ambulatory Visit: Payer: Self-pay

## 2023-04-25 ENCOUNTER — Other Ambulatory Visit: Payer: Self-pay

## 2023-04-26 ENCOUNTER — Ambulatory Visit: Payer: Medicaid Other | Admitting: Nurse Practitioner

## 2023-04-26 ENCOUNTER — Encounter: Payer: Self-pay | Admitting: Nurse Practitioner

## 2023-04-26 VITALS — BP 124/89 | HR 71 | Temp 97.2°F | Ht 70.0 in | Wt 150.2 lb

## 2023-04-26 DIAGNOSIS — Z01818 Encounter for other preprocedural examination: Secondary | ICD-10-CM

## 2023-04-26 DIAGNOSIS — Z131 Encounter for screening for diabetes mellitus: Secondary | ICD-10-CM

## 2023-04-26 DIAGNOSIS — Z1329 Encounter for screening for other suspected endocrine disorder: Secondary | ICD-10-CM

## 2023-04-26 LAB — POCT URINALYSIS DIP (CLINITEK)
Bilirubin, UA: NEGATIVE
Glucose, UA: NEGATIVE mg/dL
Ketones, POC UA: NEGATIVE mg/dL
Leukocytes, UA: NEGATIVE
Nitrite, UA: NEGATIVE
POC PROTEIN,UA: NEGATIVE
Spec Grav, UA: 1.02 (ref 1.010–1.025)
Urobilinogen, UA: 0.2 U/dL
pH, UA: 6 (ref 5.0–8.0)

## 2023-04-26 NOTE — Progress Notes (Signed)
Subjective   Patient ID: Dwayne Sandoval, adult    DOB: Jul 15, 1996, 27 y.o.   MRN: [id number]  Chief Complaint  Patient presents with   Pre-op Exam    Referring provider: Ivonne Andrew, NP  Dwayne Sandoval is a 27 y.o. 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  Patient will be going to [place] to have breast implants.  The plastic surgery office in [place] is requesting labs and EKG.  We will order these today and faxed to plastic surgery office once we have results. Denies f/c/s, n/v/d, hemoptysis, PND, leg swelling Denies chest pain or edema       No Known Allergies   There is no immunization history on file for this patient.  Tobacco History: Social History   Tobacco Use  Smoking Status Never   Passive exposure: Yes  Smokeless Tobacco Never   Counseling given: Not Answered   Outpatient Encounter Medications as of 04/26/2023  Medication Sig   emtricitabine-tenofovir AF (DESCOVY) 200-25 MG tablet Take 1 tablet by mouth daily. (Patient not taking: Reported on 04/26/2023)   No facility-administered encounter medications on file as of 04/26/2023.    Review of Systems  Review of Systems  Constitutional: Negative.   HENT: Negative.    Cardiovascular: Negative.   Gastrointestinal: Negative.   Allergic/Immunologic: Negative.   Neurological: Negative.   Psychiatric/Behavioral: Negative.       Objective:   BP 124/89   Pulse 71   Temp (!) 97.2 F (36.2 C)   Ht 5\' 10"  (1.778 m)   Wt 150 lb 3.2 oz (68.1 kg)   SpO2 100%   BMI 21.55 kg/m   Wt Readings from Last 5 Encounters:  04/26/23 150 lb 3.2 oz (68.1 kg)  04/17/23 146 lb 9.6 oz (66.5 kg)  04/10/23 146 lb (66.2 kg)  03/27/23 146 lb (66.2 kg)  01/12/23 160 lb 0.9 oz (72.6 kg)     Physical Exam Vitals and nursing note reviewed.  Constitutional:      General: He is not in acute distress.     Appearance: He is well-developed.  Cardiovascular:     Rate and Rhythm: Normal rate and regular rhythm.  Pulmonary:     Effort: Pulmonary effort is normal.     Breath sounds: Normal breath sounds.  Skin:    General: Skin is warm and dry.  Neurological:     Mental Status: He is alert and oriented to person, place, and time.       Assessment & Plan:   Thyroid disorder screen -     Thyroid Panel With TSH  Diabetes mellitus screening -     CBC -     Comprehensive metabolic panel -     Hemoglobin A1c  Preop examination -     Protime-INR -     EKG 12-Lead -     EKG 12-Lead -     POCT URINALYSIS DIP (CLINITEK)     Return if symptoms worsen or fail to improve.   Ivonne Andrew, NP 05/13/2023

## 2023-04-26 NOTE — Patient Instructions (Signed)
1. Thyroid disorder screen  - Thyroid Panel With TSH  2. Diabetes mellitus screening  - CBC - Comprehensive metabolic panel - Hemoglobin A1c  3. Preop examination  - Protime-INR - EKG 12-Lead - EKG 12-Lead  Follow up:  Follow up as needed

## 2023-04-27 LAB — CBC
Hematocrit: 45.2 % (ref 37.5–51.0)
Hemoglobin: 15.4 g/dL (ref 13.0–17.7)
MCH: 29.9 pg (ref 26.6–33.0)
MCHC: 34.1 g/dL (ref 31.5–35.7)
MCV: 88 fL (ref 79–97)
Platelets: 360 10*3/uL (ref 150–450)
RBC: 5.15 x10E6/uL (ref 4.14–5.80)
RDW: 12.4 % (ref 11.6–15.4)
WBC: 5.8 10*3/uL (ref 3.4–10.8)

## 2023-04-27 LAB — COMPREHENSIVE METABOLIC PANEL
ALT: 16 [IU]/L (ref 0–44)
AST: 18 [IU]/L (ref 0–40)
Albumin: 4.6 g/dL (ref 4.3–5.2)
Alkaline Phosphatase: 109 [IU]/L (ref 44–121)
BUN/Creatinine Ratio: 10 (ref 9–20)
BUN: 9 mg/dL (ref 6–20)
Bilirubin Total: 0.5 mg/dL (ref 0.0–1.2)
CO2: 22 mmol/L (ref 20–29)
Calcium: 9.7 mg/dL (ref 8.7–10.2)
Chloride: 101 mmol/L (ref 96–106)
Creatinine, Ser: 0.94 mg/dL (ref 0.76–1.27)
Globulin, Total: 3 g/dL (ref 1.5–4.5)
Glucose: 76 mg/dL (ref 70–99)
Potassium: 4.3 mmol/L (ref 3.5–5.2)
Sodium: 141 mmol/L (ref 134–144)
Total Protein: 7.6 g/dL (ref 6.0–8.5)
eGFR: 116 mL/min/{1.73_m2} (ref >59)

## 2023-04-27 LAB — PROTIME-INR
INR: 1 (ref 0.9–1.2)
Prothrombin Time: 11 s (ref 9.1–12.0)

## 2023-04-27 LAB — THYROID PANEL WITH TSH
Free Thyroxine Index: 2 (ref 1.2–4.9)
T3 Uptake Ratio: 25 % (ref 24–39)
T4, Total: 8 ug/dL (ref 4.5–12.0)
TSH: 2.39 u[IU]/mL (ref 0.450–4.500)

## 2023-04-27 LAB — HEMOGLOBIN A1C
Est. average glucose Bld gHb Est-mCnc: 103 mg/dL
Hgb A1c MFr Bld: 5.2 % (ref 4.8–5.6)

## 2023-04-29 ENCOUNTER — Telehealth: Payer: Self-pay | Admitting: Nurse Practitioner

## 2023-04-29 NOTE — Telephone Encounter (Signed)
Pt called requesting a phone call from [place]   Also gave fax number to send forms [phone]

## 2023-04-30 NOTE — Telephone Encounter (Signed)
Done [facility]

## 2023-05-07 ENCOUNTER — Other Ambulatory Visit: Payer: Self-pay

## 2023-05-08 ENCOUNTER — Telehealth: Payer: Self-pay | Admitting: Nurse Practitioner

## 2023-05-08 NOTE — Telephone Encounter (Signed)
Pt calling about his medical clearance form that was suppose to be sent along with an EKG. Pt stated their surgery was suppose to be today but now will have to wait until tomorrow so they can get the medical clearance

## 2023-05-09 NOTE — Telephone Encounter (Signed)
Done [facility] 

## 2023-05-13 ENCOUNTER — Encounter: Payer: Self-pay | Admitting: Nurse Practitioner

## 2023-05-14 ENCOUNTER — Other Ambulatory Visit (HOSPITAL_COMMUNITY): Payer: Self-pay

## 2023-05-19 ENCOUNTER — Emergency Department (HOSPITAL_COMMUNITY)
Admission: EM | Admit: 2023-05-19 | Discharge: 2023-05-19 | Discharge status: Against Medical Advice | Payer: Medicaid Other | Attending: Emergency Medicine | Admitting: Emergency Medicine

## 2023-05-19 ENCOUNTER — Encounter (HOSPITAL_COMMUNITY): Payer: Self-pay

## 2023-05-19 ENCOUNTER — Other Ambulatory Visit: Payer: Self-pay

## 2023-05-19 DIAGNOSIS — T8549XA Other mechanical complication of breast prosthesis and implant, initial encounter: Secondary | ICD-10-CM | POA: Diagnosis not present

## 2023-05-19 DIAGNOSIS — N6310 Unspecified lump in the right breast, unspecified quadrant: Secondary | ICD-10-CM | POA: Diagnosis present

## 2023-05-19 DIAGNOSIS — Z5321 Procedure and treatment not carried out due to patient leaving prior to being seen by health care provider: Secondary | ICD-10-CM | POA: Insufficient documentation

## 2023-05-19 NOTE — ED Notes (Signed)
Pt called for vitals no answer. °

## 2023-05-19 NOTE — ED Notes (Signed)
Called no answer

## 2023-05-19 NOTE — ED Notes (Signed)
Pt call to update vitals no answer

## 2023-05-19 NOTE — ED Triage Notes (Signed)
Pt to ED by POV from home with c/o post op swelling from breast implants placed on 10/24. Pt states that she has noticed more swelling to the right breast and has concerns. Arrives A+O, VSS, NADN.

## 2023-06-04 ENCOUNTER — Other Ambulatory Visit: Payer: Self-pay

## 2023-06-04 NOTE — Progress Notes (Signed)
Specialty Pharmacy Refill Coordination Note  Dwayne Sandoval is a 27 y.o. adult contacted today regarding refills of specialty medication(s) Emtricitabine-Tenofovir Af   Patient requested Delivery   Delivery date: 06/10/23   Verified address: [address] [name] [address]   Medication will be filled on 06/07/23.

## 2023-06-09 ENCOUNTER — Other Ambulatory Visit: Payer: Self-pay

## 2023-06-09 ENCOUNTER — Emergency Department (HOSPITAL_BASED_OUTPATIENT_CLINIC_OR_DEPARTMENT_OTHER)
Admission: EM | Admit: 2023-06-09 | Discharge: 2023-06-09 | Disposition: A | Discharge status: Home / Self Care | Payer: No Typology Code available for payment source | Attending: Emergency Medicine | Admitting: Emergency Medicine

## 2023-06-09 ENCOUNTER — Encounter (HOSPITAL_BASED_OUTPATIENT_CLINIC_OR_DEPARTMENT_OTHER): Payer: Self-pay

## 2023-06-09 DIAGNOSIS — F17210 Nicotine dependence, cigarettes, uncomplicated: Secondary | ICD-10-CM | POA: Diagnosis not present

## 2023-06-09 DIAGNOSIS — Z202 Contact with and (suspected) exposure to infections with a predominantly sexual mode of transmission: Secondary | ICD-10-CM | POA: Insufficient documentation

## 2023-06-09 DIAGNOSIS — R369 Urethral discharge, unspecified: Secondary | ICD-10-CM | POA: Diagnosis present

## 2023-06-09 LAB — URINALYSIS, ROUTINE W REFLEX MICROSCOPIC
Bacteria, UA: NONE SEEN
Bilirubin Urine: NEGATIVE
Glucose, UA: NEGATIVE mg/dL
Ketones, ur: NEGATIVE mg/dL
Nitrite: NEGATIVE
Specific Gravity, Urine: 1.028 (ref 1.005–1.030)
pH: 6 (ref 5.0–8.0)

## 2023-06-09 LAB — HIV ANTIBODY (ROUTINE TESTING W REFLEX): HIV Screen 4th Generation wRfx: NONREACTIVE

## 2023-06-09 MED ORDER — DOXYCYCLINE HYCLATE 100 MG PO TABS: 100.0000 mg | ORAL_TABLET | Freq: Two times a day (BID) | ORAL | 0 refills | Status: DC

## 2023-06-09 MED ORDER — SODIUM CHLORIDE 0.9 % IV SOLN
1.0000 g | Freq: Once | INTRAVENOUS | Status: AC
  Administered 2023-06-09: 1 g via INTRAVENOUS
  Filled 2023-06-09: qty 10

## 2023-06-09 NOTE — Discharge Instructions (Addendum)
It was a pleasure caring for you today in the emergency department.  Results of testing today will be available on MyChart.  Please refrain from sexual activity until completed antibiotics or have a negative result of your STD testing.  Recommend referencing safe sex handout that is attached to this document  Please return to the emergency department for any worsening or worrisome symptoms.

## 2023-06-09 NOTE — ED Provider Notes (Signed)
[place] EMERGENCY DEPARTMENT AT [facility] Provider Note  CSN: [id number] Arrival date & time: 06/09/23 0416  Chief Complaint(s) Penile Discharge  HPI Dwayne Sandoval is a 27 y.o. adult with past medical history as below, significant for gonorrhea, [name] syndrome, monkeypox, high risk sexual behavior, male to male transgender who presents to the ED with complaint of concern for STI.  Currently sexually active, noticed penile discharge the last 3 days, sometimes yellow.  No dysuria.  No rashes.  No nausea or vomiting.  No fevers.  No change with bowel output.  Recent unprotected sexual activity    Past Medical History Past Medical History:  Diagnosis Date   Gonorrhea    [name] syndrome [facility])    Patient Active Problem List   Diagnosis Date Noted   HIV exposure 04/12/2023   Lipid screening 03/28/2023   Screen for STD (sexually transmitted disease) 12/06/2022   Screening for STD (sexually transmitted disease) 10/03/2022   Routine screening for STI (sexually transmitted infection) 02/16/2022   Monkeypox 04/13/2021   H/O gonorrhea 04/13/2021   High risk sexual behavior 07/30/2018   Home Medication(s) Prior to Admission medications   Medication Sig Start Date End Date Taking? Authorizing Provider  doxycycline (VIBRA-TABS) 100 MG tablet Take 1 tablet (100 mg total) by mouth 2 (two) times daily. 06/09/23  Yes Tanda Rockers A, DO  emtricitabine-tenofovir AF (DESCOVY) 200-25 MG tablet Take 1 tablet by mouth daily. Patient not taking: Reported on 04/26/2023 04/17/23   Jennette Kettle, RPH-CPP                                                                                                                                    Past Surgical History History reviewed. No pertinent surgical history. Family History Family History  Problem Relation Age of Onset   Diabetes Mother    Healthy Father     Social History Social History   Tobacco Use   Smoking status:  Never    Passive exposure: Yes   Smokeless tobacco: Never  Vaping Use   Vaping status: Every Day   Substances: Nicotine, Flavoring  Substance Use Topics   Alcohol use: Yes    Comment: occasionally   Drug use: Not Currently    Types: Marijuana    Comment: Occasionally   Allergies Patient has no known allergies.  Review of Systems Review of Systems  Constitutional:  Negative for fever.  Gastrointestinal:  Negative for abdominal pain.  Genitourinary:  Positive for penile discharge. Negative for decreased urine volume, pelvic pain, penile swelling, scrotal swelling and urgency.  Skin:  Negative for rash and wound.  All other systems reviewed and are negative.   Physical Exam Vital Signs  I have reviewed the triage vital signs BP 125/88   Pulse 89   Temp 98.4 F (36.9 C) (Oral)   Resp 18   Wt 68 kg   SpO2 100%   BMI  21.52 kg/m  Physical Exam Vitals and nursing note reviewed. Exam conducted with a chaperone present Materials engineer).  Constitutional:      General: He is not in acute distress.    Appearance: Normal appearance. He is well-developed. He is not ill-appearing.  HENT:     Head: Normocephalic and atraumatic.     Right Ear: External ear normal.     Left Ear: External ear normal.     Nose: Nose normal.     Mouth/Throat:     Mouth: Mucous membranes are moist.  Eyes:     General: No scleral icterus.       Right eye: No discharge.        Left eye: No discharge.  Cardiovascular:     Rate and Rhythm: Normal rate.  Pulmonary:     Effort: Pulmonary effort is normal. No respiratory distress.     Breath sounds: No stridor.  Abdominal:     General: Abdomen is flat. There is no distension.     Tenderness: There is no guarding.  Musculoskeletal:        General: No deformity.     Cervical back: No rigidity.  Skin:    General: Skin is warm and dry.     Coloration: Skin is not cyanotic, jaundiced or pale.  Neurological:     Mental Status: He is alert and oriented to  person, place, and time.     GCS: GCS eye subscore is 4. GCS verbal subscore is 5. GCS motor subscore is 6.  Psychiatric:        Speech: Speech normal.        Behavior: Behavior normal. Behavior is cooperative.     ED Results and Treatments Labs (all labs ordered are listed, but only abnormal results are displayed) Labs Reviewed  URINALYSIS, ROUTINE W REFLEX MICROSCOPIC - Abnormal; Notable for the following components:      Result Value   Hgb urine dipstick TRACE (*)    Protein, ur TRACE (*)    Leukocytes,Ua TRACE (*)    All other components within normal limits  RPR  HIV ANTIBODY (ROUTINE TESTING W REFLEX)  GC/CHLAMYDIA PROBE AMP ([place]) NOT AT [facility]  GC/CHLAMYDIA PROBE AMP ([place]) NOT AT [facility]                                                                                                                          Radiology No results found.  Pertinent labs & imaging results that were available during my care of the patient were reviewed by me and considered in my medical decision making (see MDM for details).  Medications Ordered in ED Medications  cefTRIAXone (ROCEPHIN) 1 g in sodium chloride 0.9 % 100 mL IVPB (0 g Intravenous Stopped 06/09/23 0630)  Procedures Procedures  (including critical care time)  Medical Decision Making / ED Course    Medical Decision Making:    Anatoly Sandoval is a 27 y.o. adult with past medical history as below, significant for gonorrhea, [name] syndrome, monkeypox, high risk sexual behavior, male to male transgender who presents to the ED with complaint of concern for STI. The complaint involves an extensive differential diagnosis and also carries with it a high risk of complications and morbidity.  Serious etiology was considered. Ddx includes but is not limited to: STI, UTI, gonorrhea,  chlamydia, etc.  Complete initial physical exam performed, notably the patient was in no acute distress, HDS, exam is stable.    Reviewed and confirmed nursing documentation for past medical history, family history, social history.  Vital signs reviewed.    Patient requesting STI check and suture removal  Clinical Course as of 06/09/23 0750  Sun Jun 09, 2023  0557 WBC, UA: 21-50 [SG]  0557 Bacteria, UA: NONE SEEN Sterile pyuria  [SG]    Clinical Course User Index [SG] Sloan Leiter, DO    Brief summary: 26 year old male to male transgender here requesting STI check and suture removal.  Will check urine for STI, also requesting RPR and HIV testing.  Requesting Rocephin to be given intravenously with rather than intramuscularly due to discomfort from prior injection.  Reports that an injection into the buttock will interfere with patient's ability to play kickball with family/friends.  Agreeable to empiric treatment for STI.  Recommend refrain from sexual activity until completion of antibiotics or negative test results.  Recommend follow-up with PCP  The patient improved significantly and was discharged in stable condition. Detailed discussions were had with the patient regarding current findings, and need for close f/u with PCP or on call doctor. The patient has been instructed to return immediately if the symptoms worsen in any way for re-evaluation. Patient verbalized understanding and is in agreement with current care plan. All questions answered prior to discharge.                  Additional history obtained: -Additional history obtained from na -External records from outside source obtained and reviewed including: Chart review including previous notes, labs, imaging, consultation notes including  Primary care documentation, home medications   Lab Tests: -I ordered, reviewed, and interpreted labs.   The pertinent results include:   Labs Reviewed  URINALYSIS,  ROUTINE W REFLEX MICROSCOPIC - Abnormal; Notable for the following components:      Result Value   Hgb urine dipstick TRACE (*)    Protein, ur TRACE (*)    Leukocytes,Ua TRACE (*)    All other components within normal limits  RPR  HIV ANTIBODY (ROUTINE TESTING W REFLEX)  GC/CHLAMYDIA PROBE AMP ([place] [facility]) NOT AT [facility]  GC/CHLAMYDIA PROBE AMP ([place]) NOT AT [facility]    Notable for ua with sterile pyuria   EKG   EKG Interpretation Date/Time:    Ventricular Rate:    PR Interval:    QRS Duration:    QT Interval:    QTC Calculation:   R Axis:      Text Interpretation:           Imaging Studies ordered: na   Medicines ordered and prescription drug management: Meds ordered this encounter  Medications   doxycycline (VIBRA-TABS) 100 MG tablet    Sig: Take 1 tablet (100 mg total) by mouth 2 (two) times daily.    Dispense:  14  tablet    Refill:  0   cefTRIAXone (ROCEPHIN) 1 g in sodium chloride 0.9 % 100 mL IVPB    Order Specific Question:   Antibiotic Indication:    Answer:   Other Indication (list below)    Order Specific Question:   Other Indication:    Answer:   sti    -I have reviewed the patients home medicines and have made adjustments as needed   Consultations Obtained: na   Cardiac Monitoring: Continuous pulse oximetry interpreted by myself, 99% on RA.    Social Determinants of Health:  Diagnosis or treatment significantly limited by social determinants of health: former smoker   Reevaluation: After the interventions noted above, I reevaluated the patient and found that they have stayed the same  Co morbidities that complicate the patient evaluation  Past Medical History:  Diagnosis Date   Gonorrhea    [name] syndrome [facility])       Dispostion: Disposition decision including need for hospitalization was considered, and patient discharged from emergency department.    Final Clinical Impression(s) / ED Diagnoses Final diagnoses:   Possible exposure to STD        Sloan Leiter, DO 06/09/23 0960

## 2023-06-09 NOTE — ED Triage Notes (Signed)
Pt to triage c/o yellow penile discharge x 3 days. Pt denies fever chills. VSS NAD PT on room air.

## 2023-06-10 LAB — GC/CHLAMYDIA PROBE AMP (~~LOC~~) NOT AT ARMC
Chlamydia: NEGATIVE
Comment: NEGATIVE
Comment: NORMAL
Neisseria Gonorrhea: NEGATIVE

## 2023-06-10 LAB — RPR: RPR Ser Ql: NONREACTIVE

## 2023-06-11 ENCOUNTER — Other Ambulatory Visit (HOSPITAL_COMMUNITY): Payer: Self-pay

## 2023-06-27 ENCOUNTER — Encounter: Payer: Self-pay | Admitting: Nurse Practitioner

## 2023-06-27 ENCOUNTER — Ambulatory Visit (INDEPENDENT_AMBULATORY_CARE_PROVIDER_SITE_OTHER): Payer: Medicaid Other | Admitting: Nurse Practitioner

## 2023-06-27 VITALS — BP 126/66 | HR 76 | Temp 99.0°F | Wt 155.6 lb

## 2023-06-27 DIAGNOSIS — Z113 Encounter for screening for infections with a predominantly sexual mode of transmission: Secondary | ICD-10-CM | POA: Diagnosis not present

## 2023-06-27 NOTE — Progress Notes (Signed)
   Subjective   Patient ID: Dwayne Sandoval, adult    DOB: 1996-05-13, 27 y.o.   MRN: [id number]  Chief Complaint  Patient presents with   Follow-up    3 month follow up, blood test for Herpes     Referring provider: Ivonne Andrew, NP  Christain Moure is a 27 y.o. adult with Past Medical History: No date: Gonorrhea No date: [name] syndrome [facility])   HPI  Patient presents for a follow up visit today. He does request STD testing today. Decided not to get tested for herpes. Not having any symptoms. Denies f/c/s, n/v/d, hemoptysis, PND, leg swelling Denies chest pain or edema    No Known Allergies   There is no immunization history on file for this patient.  Tobacco History: Social History   Tobacco Use  Smoking Status Never   Passive exposure: Yes  Smokeless Tobacco Never   Counseling given: Not Answered   Outpatient Encounter Medications as of 06/27/2023  Medication Sig   doxycycline (VIBRA-TABS) 100 MG tablet Take 1 tablet (100 mg total) by mouth 2 (two) times daily. (Patient not taking: Reported on 06/27/2023)   emtricitabine-tenofovir AF (DESCOVY) 200-25 MG tablet Take 1 tablet by mouth daily. (Patient not taking: Reported on 06/27/2023)   No facility-administered encounter medications on file as of 06/27/2023.    Review of Systems  Review of Systems  Constitutional: Negative.   HENT: Negative.    Cardiovascular: Negative.   Gastrointestinal: Negative.   Allergic/Immunologic: Negative.   Neurological: Negative.   Psychiatric/Behavioral: Negative.       Objective:   BP 126/66   Pulse 76   Temp 99 F (37.2 C)   Wt 155 lb 9.6 oz (70.6 kg)   SpO2 100%   BMI 22.33 kg/m   Wt Readings from Last 5 Encounters:  06/27/23 155 lb 9.6 oz (70.6 kg)  06/09/23 150 lb (68 kg)  04/26/23 150 lb 3.2 oz (68.1 kg)  04/17/23 146 lb 9.6 oz (66.5 kg)  04/10/23 146 lb (66.2 kg)     Physical Exam Vitals and nursing note reviewed.  Constitutional:       General: He is not in acute distress.    Appearance: He is well-developed.  Cardiovascular:     Rate and Rhythm: Normal rate and regular rhythm.  Pulmonary:     Effort: Pulmonary effort is normal.     Breath sounds: Normal breath sounds.  Skin:    General: Skin is warm and dry.  Neurological:     Mental Status: He is alert and oriented to person, place, and time.       Assessment & Plan:   Screen for STD (sexually transmitted disease) -     RPR+HIV+GC+CT Panel -     CBC -     Comprehensive metabolic panel     Return in about 6 months (around 12/26/2023).     Ivonne Andrew, NP 06/27/2023

## 2023-06-27 NOTE — Patient Instructions (Addendum)
1. Screen for STD (sexually transmitted disease) (Primary)  - RPR+HIV+GC+CT Panel - CBC - Comprehensive metabolic panel     Follow up:  Follow up in 3 months

## 2023-07-03 ENCOUNTER — Other Ambulatory Visit: Payer: Self-pay

## 2023-07-03 LAB — RPR+HIV+GC+CT PANEL

## 2023-07-03 LAB — COMPREHENSIVE METABOLIC PANEL
ALT: 20 [IU]/L (ref 0–44)
AST: 27 [IU]/L (ref 0–40)
Albumin: 4.8 g/dL (ref 4.3–5.2)
Alkaline Phosphatase: 93 [IU]/L (ref 44–121)
BUN/Creatinine Ratio: 9 (ref 9–20)
BUN: 9 mg/dL (ref 6–20)
Bilirubin Total: 1.1 mg/dL (ref 0.0–1.2)
CO2: 20 mmol/L (ref 20–29)
Calcium: 10.3 mg/dL — ABNORMAL HIGH (ref 8.7–10.2)
Chloride: 105 mmol/L (ref 96–106)
Creatinine, Ser: 1.01 mg/dL (ref 0.76–1.27)
Globulin, Total: 2.9 g/dL (ref 1.5–4.5)
Glucose: 88 mg/dL (ref 70–99)
Potassium: 4.7 mmol/L (ref 3.5–5.2)
Sodium: 146 mmol/L — ABNORMAL HIGH (ref 134–144)
Total Protein: 7.7 g/dL (ref 6.0–8.5)
eGFR: 106 mL/min/{1.73_m2} (ref >59)

## 2023-07-03 LAB — CBC
Hematocrit: 44.5 % (ref 37.5–51.0)
Hemoglobin: 14.7 g/dL (ref 13.0–17.7)
MCH: 29.5 pg (ref 26.6–33.0)
MCHC: 33 g/dL (ref 31.5–35.7)
MCV: 89 fL (ref 79–97)
Platelets: 308 10*3/uL (ref 150–450)
RBC: 4.98 x10E6/uL (ref 4.14–5.80)
RDW: 12.9 % (ref 11.6–15.4)
WBC: 4.2 10*3/uL (ref 3.4–10.8)

## 2023-07-03 NOTE — Progress Notes (Signed)
 My chart message was sent to pt after confirming last activity date.

## 2023-07-08 ENCOUNTER — Other Ambulatory Visit (HOSPITAL_COMMUNITY): Payer: Self-pay

## 2023-07-08 ENCOUNTER — Encounter (HOSPITAL_COMMUNITY): Payer: Self-pay

## 2023-07-11 ENCOUNTER — Other Ambulatory Visit: Payer: Self-pay

## 2023-07-16 NOTE — Progress Notes (Deleted)
 Date:  07/16/2023   HPI: Dwayne Sandoval is a 27 y.o. adult who presents to the [facility] pharmacy clinic for HIV PrEP follow-up.  Insured   [x]    Uninsured  []    Patient Active Problem List   Diagnosis Date Noted   HIV exposure 04/12/2023   Lipid screening 03/28/2023   Screen for STD (sexually transmitted disease) 12/06/2022   Screening for STD (sexually transmitted disease) 10/03/2022   Routine screening for STI (sexually transmitted infection) 02/16/2022   Monkeypox 04/13/2021   H/O gonorrhea 04/13/2021   High risk sexual behavior 07/30/2018    Patient's Medications  New Prescriptions   No medications on file  Previous Medications   DOXYCYCLINE  (VIBRA -TABS) 100 MG TABLET    Take 1 tablet (100 mg total) by mouth 2 (two) times daily.   EMTRICITABINE -TENOFOVIR  AF (DESCOVY ) 200-25 MG TABLET    Take 1 tablet by mouth daily.  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[facility])     Social History: Social History   Socioeconomic History   Marital status: Single    Spouse name: Not on file   Number of children: Not on file   Years of education: Not on file   Highest education level: Not on file  Occupational History   Not on file  Tobacco Use   Smoking status: Never    Passive exposure: Yes   Smokeless tobacco: Never  Vaping Use   Vaping status: Every Day   Substances: Nicotine, Flavoring  Substance and Sexual Activity   Alcohol use: Yes    Comment: occasionally   Drug use: Not Currently    Types: Marijuana    Comment: Occasionally   Sexual activity: Not on file  Other Topics Concern   Not on file  Social History Narrative   Not on file   Social Drivers of Health   Financial Resource Strain: Not on file  Food Insecurity: Not on file  Transportation Needs: Not on file   Physical Activity: Not on file  Stress: Not on file  Social Connections: Not on file        No data to display          Labs:  SCr: Lab Results  Component Value Date   CREATININE 1.01 06/27/2023   CREATININE 0.94 04/26/2023   CREATININE 1.01 01/12/2023   CREATININE 0.94 10/14/2022   CREATININE 0.93 08/18/2021   HIV Lab Results  Component Value Date   HIV Non Reactive 06/27/2023   HIV Non Reactive 06/09/2023   HIV Non Reactive 04/08/2023   HIV Non Reactive 03/28/2023   HIV NON REACTIVE 01/10/2023   Hepatitis B Lab Results  Component Value Date   HEPBSAG NON-REACTIVE 04/17/2023   Hepatitis C No results found for: HEPCAB, HCVRNAPCRQN Hepatitis A No results found for: HAV RPR and STI Lab Results  Component Value Date   LABRPR Non Reactive 06/27/2023   LABRPR NON REACTIVE 06/09/2023   LABRPR Non Reactive 04/08/2023   LABRPR Non Reactive 03/28/2023   LABRPR Non Reactive 12/06/2022    STI Results GC CT  Latest Ref Rng & Units  Negative  06/27/2023  4:20 PM  Negative   06/09/2023  4:49 AM Negative  Negative   04/08/2023  3:21 PM  Negative   03/28/2023  3:03 PM  Negative   01/09/2023  1:30 PM Negative  Negative   12/06/2022  3:47 PM  Negative   10/14/2022  3:55 PM Negative  Negative   09/07/2022  4:25 PM  CANCELED   09/07/2022  4:17 PM  Negative   08/20/2022  1:31 AM Positive  Negative   05/18/2022  4:27 PM  Negative   02/16/2022  2:46 PM  Negative   11/17/2021  3:30 PM  Negative   11/02/2021  3:32 AM Negative  Negative   03/31/2021  5:30 AM Negative  Negative   02/27/2020  6:22 PM Negative    Negative  Negative    Negative   10/15/2019 12:28 PM Positive    Negative  Negative    Negative   06/20/2019 12:00 AM Negative  Negative   04/30/2019 12:00 AM **POSITIVE**  Negative   01/23/2019 12:00 AM Negative  Negative     Assessment: Sasuke presents to clinic today for PrEP follow-up. Denies any missed doses or adverse effects.  Screened patient  for acute HIV symptoms such as fatigue, muscle aches, rash, sore throat, lymphadenopathy, headache, night sweats, nausea/vomiting/diarrhea, and fever.  Still has ~*** tablets left over. No new issues with [facility] pharmacy. Will check HIV antibody and send in refill once it results negative.  Last STI screening was in November and was negative. *** new sexual partners since last visit; will check *** today.  Due for Tdap, flu, COVID, and HAV vaccines. Will also check HBV surface antibody to determine immunity.   Plan: - Check HIV antibody, HBV antibody, and *** - If HIV antibody, refill Descovy  x 3 months - Follow-up with *** on ***   Alan Geralds, PharmD, CPP, BCIDP, AAHIVP Clinical Pharmacist Practitioner Infectious Diseases Clinical Pharmacist [facility] 07/16/2023, 3:43 PM

## 2023-07-18 ENCOUNTER — Ambulatory Visit: Payer: Medicaid Other | Admitting: Pharmacist

## 2023-07-18 DIAGNOSIS — Z79899 Other long term (current) drug therapy: Secondary | ICD-10-CM

## 2023-07-18 DIAGNOSIS — Z113 Encounter for screening for infections with a predominantly sexual mode of transmission: Secondary | ICD-10-CM

## 2023-08-06 ENCOUNTER — Other Ambulatory Visit (HOSPITAL_COMMUNITY): Payer: Self-pay

## 2023-08-06 ENCOUNTER — Other Ambulatory Visit: Payer: Self-pay

## 2023-08-06 NOTE — Progress Notes (Signed)
Specialty Pharmacy Refill Coordination Note  Dwayne Sandoval is a 28 y.o. adult contacted today regarding refills of specialty medication(s) Emtricitabine-Tenofovir AF (Descovy)   Patient requested No data recorded  Delivery date: 08/08/23   Verified address: [address] [name] [address]   Medication will be filled on 08/07/23.

## 2023-08-29 ENCOUNTER — Other Ambulatory Visit: Payer: Self-pay

## 2023-08-29 ENCOUNTER — Emergency Department (HOSPITAL_COMMUNITY)
Admission: EM | Admit: 2023-08-29 | Discharge: 2023-08-29 | Disposition: A | Discharge status: Home / Self Care | Payer: Medicaid Other | Attending: Emergency Medicine | Admitting: Emergency Medicine

## 2023-08-29 ENCOUNTER — Encounter (HOSPITAL_BASED_OUTPATIENT_CLINIC_OR_DEPARTMENT_OTHER): Payer: Self-pay

## 2023-08-29 ENCOUNTER — Emergency Department (HOSPITAL_BASED_OUTPATIENT_CLINIC_OR_DEPARTMENT_OTHER)
Admission: EM | Admit: 2023-08-29 | Discharge: 2023-08-29 | Disposition: A | Discharge status: Home / Self Care | Payer: Medicaid Other

## 2023-08-29 ENCOUNTER — Encounter (HOSPITAL_COMMUNITY): Payer: Self-pay

## 2023-08-29 DIAGNOSIS — L03011 Cellulitis of right finger: Secondary | ICD-10-CM | POA: Insufficient documentation

## 2023-08-29 DIAGNOSIS — M79641 Pain in right hand: Secondary | ICD-10-CM | POA: Diagnosis present

## 2023-08-29 DIAGNOSIS — L02413 Cutaneous abscess of right upper limb: Secondary | ICD-10-CM | POA: Insufficient documentation

## 2023-08-29 DIAGNOSIS — Z5321 Procedure and treatment not carried out due to patient leaving prior to being seen by health care provider: Secondary | ICD-10-CM | POA: Insufficient documentation

## 2023-08-29 MED ORDER — SULFAMETHOXAZOLE-TRIMETHOPRIM 800-160 MG PO TABS: 1.0000 | ORAL_TABLET | Freq: Two times a day (BID) | ORAL | 0 refills | Status: AC

## 2023-08-29 MED ORDER — LIDOCAINE HCL 2 % IJ SOLN
5.0000 mL | Freq: Once | INTRAMUSCULAR | Status: AC
  Administered 2023-08-29: 10 mg
  Filled 2023-08-29: qty 20

## 2023-08-29 NOTE — Discharge Instructions (Addendum)
Please soak your hand in warm water with 3 to 4 drops of iodine 2-3 times a day.  Please take the antibiotics we are prescribing.  Return immediately felt fevers, chills, severe pain, finger becomes diffusely swollen, painful to bend it.  Or, you develop any new or worsening symptoms that are concerning to you.

## 2023-08-29 NOTE — ED Provider Notes (Signed)
[place] EMERGENCY DEPARTMENT AT [facility] Provider Note   CSN: [id number] Arrival date & time: 08/29/23  7829     History  Chief Complaint  Patient presents with   Hand Pain    Dwayne Sandoval is a 28 y.o. adult.  This is a 28 year old male presenting emergency department with pain to his right middle finger at the distal phalanx.  Paronychia x 3 to 5 days.  No fevers no chills.  No numbness tingling changes in sensation.   Hand Pain       Home Medications Prior to Admission medications   Medication Sig Start Date End Date Taking? Authorizing Provider  sulfamethoxazole-trimethoprim (BACTRIM DS) 800-160 MG tablet Take 1 tablet by mouth 2 (two) times daily for 7 days. 08/29/23 09/05/23 Yes Amiah Frohlich, Harmon Dun, DO  doxycycline (VIBRA-TABS) 100 MG tablet Take 1 tablet (100 mg total) by mouth 2 (two) times daily. Patient not taking: Reported on 06/27/2023 06/09/23   Tanda Rockers A, DO  emtricitabine-tenofovir AF (DESCOVY) 200-25 MG tablet Take 1 tablet by mouth daily. Patient not taking: Reported on 06/27/2023 04/17/23   Jennette Kettle, RPH-CPP      Allergies    Patient has no known allergies.    Review of Systems   Review of Systems  Physical Exam Updated Vital Signs BP 130/76   Pulse 70   Temp 98.1 F (36.7 C) (Oral)   Resp 16   Ht 5\' 10"  (1.778 m)   Wt 70.3 kg   SpO2 98%   BMI 22.24 kg/m  Physical Exam Vitals reviewed.  Constitutional:      Appearance: He is not toxic-appearing.  Musculoskeletal:     Comments: Right middle finger with paronychia.  Full ROM.  Brisk cap refill.  No fusiform swelling, tenderness, pain along flexor surface.  Neurological:     Mental Status: He is alert.     ED Results / Procedures / Treatments   Labs (all labs ordered are listed, but only abnormal results are displayed) Labs Reviewed - No data to display  EKG None  Radiology No results found.  Procedures .Incision and Drainage  Date/Time: 08/29/2023 8:11  AM  Performed by: Coral Spikes, DO Authorized by: Coral Spikes, DO   Consent:    Consent obtained:  Verbal   Risks discussed:  Bleeding, incomplete drainage, pain and infection   Alternatives discussed:  No treatment Universal protocol:    Procedure explained and questions answered to patient or proxy's satisfaction: yes     Patient identity confirmed:  Verbally with patient Location:    Type:  Abscess   Location:  Upper extremity   Upper extremity location:  Finger   Finger location:  R long finger Pre-procedure details:    Skin preparation:  Chlorhexidine Anesthesia:    Anesthesia method:  Local infiltration   Local anesthetic:  Lidocaine 2% w/o epi Procedure type:    Complexity:  Simple Procedure details:    Ultrasound guidance: no     Incision types:  Stab incision   Drainage:  Bloody and serous   Wound treatment:  Wound left open   Packing materials:  None Post-procedure details:    Procedure completion:  Tolerated     Medications Ordered in ED Medications  lidocaine (XYLOCAINE) 2 % (with pres) injection 100 mg (10 mg Infiltration Given by Other 08/29/23 5621)    ED Course/ Medical Decision Making/ A&P  Medical Decision Making 28 year old male presenting with paronychia.  Afebrile vital signs reassuring.  No signs of flexor tenosynovitis on exam.  Bedside I&D performed.  Will discharge with antibiotics.  Follow-up with primary doctor.  Risk Prescription drug management.         Final Clinical Impression(s) / ED Diagnoses Final diagnoses:  Paronychia of finger of right hand    Rx / DC Orders ED Discharge Orders          Ordered    sulfamethoxazole-trimethoprim (BACTRIM DS) 800-160 MG tablet  2 times daily        08/29/23 0813              Coral Spikes, DO 08/29/23 1533

## 2023-08-29 NOTE — ED Triage Notes (Signed)
Pt POV d/t cuticle swelling and pain - middle right  - onset 3-5 days. 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

## 2023-08-29 NOTE — ED Triage Notes (Signed)
Pt has infection around the middle finger nail bed on the right hand. Started 3 days ago

## 2023-08-30 ENCOUNTER — Other Ambulatory Visit: Payer: Self-pay

## 2023-09-02 ENCOUNTER — Other Ambulatory Visit: Payer: Self-pay

## 2023-09-12 ENCOUNTER — Other Ambulatory Visit: Payer: Self-pay

## 2023-09-26 ENCOUNTER — Ambulatory Visit: Payer: Self-pay | Admitting: Nurse Practitioner

## 2023-09-27 ENCOUNTER — Encounter: Payer: Self-pay | Admitting: Nurse Practitioner

## 2023-09-27 ENCOUNTER — Ambulatory Visit (INDEPENDENT_AMBULATORY_CARE_PROVIDER_SITE_OTHER): Payer: Medicaid Other | Admitting: Nurse Practitioner

## 2023-09-27 VITALS — BP 130/61 | HR 71 | Temp 98.1°F | Wt 154.0 lb

## 2023-09-27 DIAGNOSIS — Z113 Encounter for screening for infections with a predominantly sexual mode of transmission: Secondary | ICD-10-CM | POA: Diagnosis not present

## 2023-09-27 NOTE — Patient Instructions (Signed)
 1. Screen for STD (sexually transmitted disease) (Primary)  - Chlamydia/Gonococcus/Trichomonas, NAA - RPR+HIV+GC+CT Panel

## 2023-09-27 NOTE — Progress Notes (Signed)
   Subjective   Patient ID: Dwayne Sandoval, adult    DOB: [birth year], 28 y.o.   MRN: [id number]  Chief Complaint  Patient presents with   Exposure to STD    STD testing    Referring provider: Ivonne Andrew, NP  Dwayne Sandoval is a 28 y.o. 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  Patient has no known exposure at this time.  Patient has no current symptoms. Denies f/c/s, n/v/d, hemoptysis, PND, leg swelling Denies chest pain or edema     No Known Allergies   There is no immunization history on file for this patient.  Tobacco History: Social History   Tobacco Use  Smoking Status Never   Passive exposure: Yes  Smokeless Tobacco Never   Counseling given: Not Answered   Outpatient Encounter Medications as of 09/27/2023  Medication Sig   emtricitabine-tenofovir AF (DESCOVY) 200-25 MG tablet Take 1 tablet by mouth daily.   doxycycline (VIBRA-TABS) 100 MG tablet Take 1 tablet (100 mg total) by mouth 2 (two) times daily. (Patient not taking: Reported on 09/27/2023)   No facility-administered encounter medications on file as of 09/27/2023.    Review of Systems  Review of Systems  Constitutional: Negative.   HENT: Negative.    Cardiovascular: Negative.   Gastrointestinal: Negative.   Allergic/Immunologic: Negative.   Neurological: Negative.   Psychiatric/Behavioral: Negative.       Objective:   BP 130/61   Pulse 71   Temp 98.1 F (36.7 C) (Oral)   Wt 154 lb (69.9 kg)   SpO2 99%   BMI 22.10 kg/m   Wt Readings from Last 5 Encounters:  09/27/23 154 lb (69.9 kg)  08/29/23 155 lb (70.3 kg)  06/27/23 155 lb 9.6 oz (70.6 kg)  06/09/23 150 lb (68 kg)  04/26/23 150 lb 3.2 oz (68.1 kg)     Physical Exam Vitals and nursing note reviewed.  Constitutional:      General: He is not in acute distress.    Appearance: He is well-developed.  Cardiovascular:     Rate  and Rhythm: Normal rate and regular rhythm.  Pulmonary:     Effort: Pulmonary effort is normal.     Breath sounds: Normal breath sounds.  Skin:    General: Skin is warm and dry.  Neurological:     Mental Status: He is alert and oriented to person, place, and time.       Assessment & Plan:   Screen for STD (sexually transmitted disease) -     Chlamydia/Gonococcus/Trichomonas, NAA -     RPR+HIV+GC+CT Panel     Return if symptoms worsen or fail to improve.   Ivonne Andrew, NP 09/27/2023

## 2023-09-30 ENCOUNTER — Encounter (HOSPITAL_COMMUNITY): Payer: Self-pay

## 2023-09-30 ENCOUNTER — Emergency Department (HOSPITAL_COMMUNITY)
Admission: EM | Admit: 2023-09-30 | Discharge: 2023-09-30 | Disposition: A | Discharge status: Home / Self Care | Attending: Emergency Medicine | Admitting: Emergency Medicine

## 2023-09-30 ENCOUNTER — Other Ambulatory Visit: Payer: Self-pay

## 2023-09-30 ENCOUNTER — Telehealth: Payer: Self-pay

## 2023-09-30 DIAGNOSIS — A539 Syphilis, unspecified: Secondary | ICD-10-CM | POA: Diagnosis not present

## 2023-09-30 LAB — RPR+HIV+GC+CT PANEL
HIV Screen 4th Generation wRfx: NONREACTIVE
RPR Ser Ql: REACTIVE — AB

## 2023-09-30 LAB — CHLAMYDIA/GONOCOCCUS/TRICHOMONAS, NAA
Chlamydia by NAA: NEGATIVE
Gonococcus by NAA: NEGATIVE
Trich vag by NAA: NEGATIVE

## 2023-09-30 LAB — RPR, QUANT. (REFLEX): Rapid Plasma Reagin, Quant: 1:32 {titer} — ABNORMAL HIGH

## 2023-09-30 MED ORDER — PENICILLIN G BENZATHINE 1200000 UNIT/2ML IM SUSY
2.4000 10*6.[IU] | PREFILLED_SYRINGE | Freq: Once | INTRAMUSCULAR | Status: AC
  Administered 2023-09-30: 2.4 10*6.[IU] via INTRAMUSCULAR
  Filled 2023-09-30: qty 4

## 2023-09-30 NOTE — Telephone Encounter (Signed)
 Called Denyse Amass back no answer lvm. KH

## 2023-09-30 NOTE — Discharge Instructions (Signed)
 Please follow-up with your primary care provider regards recent ER visit.  Today you were treated for your syphilis.  If symptoms change or worsen please return to the ER.

## 2023-09-30 NOTE — ED Triage Notes (Signed)
 Pt. Arrives for treatment for syphilis received results from PCP Friday.

## 2023-09-30 NOTE — ED Provider Notes (Signed)
 [place] EMERGENCY DEPARTMENT AT [facility] Provider Note   CSN: [id number] Arrival date & time: 09/30/23  0413     History  Chief Complaint  Patient presents with   SEXUALLY TRANSMITTED DISEASE    Dwayne Sandoval is a 28 y.o. adult history of gonorrhea presented for syphilis treatment.  Patient states that he has every 3 months STD checks with his primary care provider and that his checkup last Friday show that he has syphilis and was told to come in to be treated.  Patient denies any medication allergies.  Patient denies any rashes, altered mental status, fevers, penile lesions.  Patient is just here to have his shot.  Home Medications Prior to Admission medications   Medication Sig Start Date End Date Taking? Authorizing Provider  doxycycline (VIBRA-TABS) 100 MG tablet Take 1 tablet (100 mg total) by mouth 2 (two) times daily. Patient not taking: Reported on 09/27/2023 06/09/23   Tanda Rockers A, DO  emtricitabine-tenofovir AF (DESCOVY) 200-25 MG tablet Take 1 tablet by mouth daily. 04/17/23   Jennette Kettle, RPH-CPP      Allergies    Patient has no known allergies.    Review of Systems   Review of Systems  Physical Exam Updated Vital Signs BP 106/82 (BP Location: Right Arm)   Pulse 70   Temp 98.4 F (36.9 C) (Oral)   Resp 18   SpO2 100%  Physical Exam Constitutional:      General: He is not in acute distress. Cardiovascular:     Rate and Rhythm: Normal rate and regular rhythm.     Pulses: Normal pulses.     Heart sounds: Normal heart sounds.  Pulmonary:     Effort: Pulmonary effort is normal. No respiratory distress.     Breath sounds: Normal breath sounds.  Skin:    General: Skin is warm and dry.     Findings: No rash.  Neurological:     Mental Status: He is alert and oriented to person, place, and time.  Psychiatric:        Mood and Affect: Mood normal.     ED Results / Procedures / Treatments   Labs (all labs ordered are listed, but only  abnormal results are displayed) Labs Reviewed - No data to display  EKG None  Radiology No results found.  Procedures Procedures    Medications Ordered in ED Medications  penicillin g benzathine (BICILLIN LA) 1200000 UNIT/2ML injection 2.4 Million Units (has no administration in time range)    ED Course/ Medical Decision Making/ A&P                                 Medical Decision Making Risk Prescription drug management.   Dwayne Sandoval 28 y.o. presented today for syphilis treatment. Working DDx that I considered at this time includes, but not limited to, primary syphilis, latent syphilis, neurosyphilis.  R/o DDx: latent syphilis, neurosyphilis: These are considered less likely due to history of present illness, physical exam, labs/imaging findings  Review of prior external notes: 09/27/2023 office visit  Unique Tests and My Independent Interpretation: None  Social Determinants of Health: none  Discussion with Independent Historian: None  Discussion of Management of Tests: None  Risk: Medium: prescription drug management  Risk Stratification Score: None  Plan: On exam patient was no acute distress with stable vitals.  Patient's exam was unremarkable as he does not have rashes and does  not appear acutely altered or septic that would necessitate further workup.  Patient states he is only here to have his penicillin shot and so we will order this.  I offered a GU exam with a chaperone however patient declined as he states he only wants to have a shot.  Will have patient follow-up with his primary care provider.  Patient was given return precautions. Patient stable for discharge at this time.  Patient verbalized understanding of plan.  This chart was dictated using voice recognition software.  Despite best efforts to proofread,  errors can occur which can change the documentation meaning.         Final Clinical Impression(s) / ED Diagnoses Final diagnoses:   Syphilis    Rx / DC Orders ED Discharge Orders     None         Dwayne Sandoval 09/30/23 1478    Tilden Fossa, MD 09/30/23 [phone]

## 2023-10-02 LAB — RPR

## 2023-10-04 ENCOUNTER — Other Ambulatory Visit: Payer: Self-pay | Admitting: Nurse Practitioner

## 2023-10-04 DIAGNOSIS — A530 Latent syphilis, unspecified as early or late: Secondary | ICD-10-CM

## 2023-10-04 LAB — RPR, QUANT+TP ABS (REFLEX): Rapid Plasma Reagin, Quant: 1:32 {titer} — ABNORMAL HIGH

## 2023-10-04 LAB — RPR

## 2023-10-04 LAB — SPECIMEN STATUS REPORT

## 2023-10-14 ENCOUNTER — Other Ambulatory Visit (HOSPITAL_COMMUNITY): Payer: Self-pay

## 2023-10-21 ENCOUNTER — Other Ambulatory Visit: Payer: Self-pay

## 2023-10-22 ENCOUNTER — Telehealth: Payer: Self-pay | Admitting: Nurse Practitioner

## 2023-10-22 NOTE — Telephone Encounter (Signed)
 Copied from CRM [id number]. Topic: General - Other >> Oct 22, 2023 11:00 AM Franchot Heidelberg wrote: Reason for CRM: Claris Che from Infectious Disease called to report that the patient is not answering her phone and that they are unable to contact her for her appt today.

## 2023-10-23 ENCOUNTER — Ambulatory Visit (INDEPENDENT_AMBULATORY_CARE_PROVIDER_SITE_OTHER): Payer: Self-pay | Admitting: Nurse Practitioner

## 2023-10-23 ENCOUNTER — Encounter: Payer: Self-pay | Admitting: Nurse Practitioner

## 2023-10-23 VITALS — BP 118/69 | HR 62 | Temp 98.3°F | Wt 153.2 lb

## 2023-10-23 DIAGNOSIS — Z113 Encounter for screening for infections with a predominantly sexual mode of transmission: Secondary | ICD-10-CM

## 2023-10-23 NOTE — Progress Notes (Signed)
   Subjective   Patient ID: Dwayne Sandoval, adult    DOB: 01/20/1996, 28 y.o.   MRN: [id number]  Chief Complaint  Patient presents with   Follow-up    Referring provider: Ivonne Andrew, NP  Ettore Trebilcock is a 28 y.o. 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  He did recently test positive for syphilis and was seen in the ED for treatment.  We did refer him to infectious disease but he did not go to his appointment.  We will recheck STD screening today. Denies f/c/s, n/v/d, hemoptysis, PND, leg swelling Denies chest pain or edema    No Known Allergies   There is no immunization history on file for this patient.  Tobacco History: Social History   Tobacco Use  Smoking Status Never   Passive exposure: Yes  Smokeless Tobacco Never   Counseling given: Not Answered   Outpatient Encounter Medications as of 10/23/2023  Medication Sig   emtricitabine-tenofovir AF (DESCOVY) 200-25 MG tablet Take 1 tablet by mouth daily.   doxycycline (VIBRA-TABS) 100 MG tablet Take 1 tablet (100 mg total) by mouth 2 (two) times daily. (Patient not taking: Reported on 06/27/2023)   No facility-administered encounter medications on file as of 10/23/2023.    Review of Systems  Review of Systems  Constitutional: Negative.   HENT: Negative.    Cardiovascular: Negative.   Gastrointestinal: Negative.   Allergic/Immunologic: Negative.   Neurological: Negative.   Psychiatric/Behavioral: Negative.       Objective:   BP 118/69   Pulse 62   Temp 98.3 F (36.8 C) (Oral)   Wt 153 lb 3.2 oz (69.5 kg)   SpO2 99%   BMI 21.98 kg/m   Wt Readings from Last 5 Encounters:  10/23/23 153 lb 3.2 oz (69.5 kg)  09/27/23 154 lb (69.9 kg)  08/29/23 155 lb (70.3 kg)  06/27/23 155 lb 9.6 oz (70.6 kg)  06/09/23 150 lb (68 kg)     Physical Exam Vitals and nursing note reviewed.  Constitutional:       General: He is not in acute distress.    Appearance: He is well-developed.  Cardiovascular:     Rate and Rhythm: Normal rate and regular rhythm.  Pulmonary:     Effort: Pulmonary effort is normal.     Breath sounds: Normal breath sounds.  Skin:    General: Skin is warm and dry.  Neurological:     Mental Status: He is alert and oriented to person, place, and time.       Assessment & Plan:   Screen for STD (sexually transmitted disease) -     RPR+HIV+GC+CT Panel -     Chlamydia/Gonococcus/Trichomonas, NAA     Return in about 3 months (around 01/22/2024).   Ivonne Andrew, NP 10/23/2023

## 2023-10-23 NOTE — Patient Instructions (Signed)
 1. Screen for STD (sexually transmitted disease) (Primary)  - RPR+HIV+GC+CT Panel - Chlamydia/Gonococcus/Trichomonas, NAA

## 2023-10-25 ENCOUNTER — Other Ambulatory Visit: Payer: Self-pay | Admitting: Pharmacist

## 2023-10-25 LAB — CHLAMYDIA/GONOCOCCUS/TRICHOMONAS, NAA
Chlamydia by NAA: NEGATIVE
Gonococcus by NAA: NEGATIVE
Trich vag by NAA: NEGATIVE

## 2023-10-25 NOTE — Telephone Encounter (Signed)
 Spoke to pt he will return to ER for injection.  KH

## 2023-10-25 NOTE — Progress Notes (Signed)
 Patient has not followed with pharmacy since October when initiating PrEP therapy. Inactivating at this time.  Margarite Gouge, PharmD, CPP, BCIDP, AAHIVP Clinical Pharmacist Practitioner Infectious Diseases Clinical Pharmacist [facility]

## 2023-10-26 LAB — RPR+HIV+GC+CT PANEL
Chlamydia trachomatis, NAA: NEGATIVE
HIV Screen 4th Generation wRfx: NONREACTIVE
Neisseria Gonorrhoeae by PCR: NEGATIVE
RPR Ser Ql: REACTIVE — AB

## 2023-10-26 LAB — RPR, QUANT. (REFLEX): Rapid Plasma Reagin, Quant: 1:16 {titer} — ABNORMAL HIGH

## 2023-10-29 ENCOUNTER — Ambulatory Visit: Admitting: Family

## 2023-10-30 ENCOUNTER — Other Ambulatory Visit: Payer: Self-pay

## 2023-10-30 ENCOUNTER — Telehealth: Payer: Self-pay

## 2023-10-30 ENCOUNTER — Ambulatory Visit (INDEPENDENT_AMBULATORY_CARE_PROVIDER_SITE_OTHER): Admitting: Pharmacist

## 2023-10-30 DIAGNOSIS — Z206 Contact with and (suspected) exposure to human immunodeficiency virus [HIV]: Secondary | ICD-10-CM | POA: Diagnosis not present

## 2023-10-30 DIAGNOSIS — Z79899 Other long term (current) drug therapy: Secondary | ICD-10-CM | POA: Diagnosis not present

## 2023-10-30 NOTE — Progress Notes (Signed)
 HPI: Dwayne Sandoval is a 28 y.o. adult who presents to the [facility] pharmacy clinic for HIV PrEP follow-up.  Insured   [x]    Uninsured  []    Patient Active Problem List   Diagnosis Date Noted   HIV exposure 04/12/2023   Lipid screening 03/28/2023   Screen for STD (sexually transmitted disease) 12/06/2022   Screening for STD (sexually transmitted disease) 10/03/2022   Routine screening for STI (sexually transmitted infection) 02/16/2022   Monkeypox 04/13/2021   H/O gonorrhea 04/13/2021   High risk sexual behavior 07/30/2018    Patient's Medications  New Prescriptions   No medications on file  Previous Medications   DOXYCYCLINE (VIBRA-TABS) 100 MG TABLET    Take 1 tablet (100 mg total) by mouth 2 (two) times daily.   EMTRICITABINE-TENOFOVIR AF (DESCOVY) 200-25 MG TABLET    Take 1 tablet by mouth daily.  Modified Medications   No medications on file  Discontinued Medications   No medications on file        No data to display          Labs:  SCr: Lab Results  Component Value Date   CREATININE 1.01 06/27/2023   CREATININE 0.94 04/26/2023   CREATININE 1.01 01/12/2023   CREATININE 0.94 10/14/2022   CREATININE 0.93 08/18/2021   HIV Lab Results  Component Value Date   HIV Non Reactive 10/23/2023   HIV Non Reactive 09/27/2023   HIV Non Reactive 06/27/2023   HIV Non Reactive 06/09/2023   HIV Non Reactive 04/08/2023   Hepatitis B Lab Results  Component Value Date   HEPBSAG NON-REACTIVE 04/17/2023   Hepatitis C No results found for: "HEPCAB", "HCVRNAPCRQN" Hepatitis A No results found for: "HAV" RPR and STI Lab Results  Component Value Date   LABRPR Reactive (A) 10/23/2023   LABRPR Reactive (A) 09/27/2023   LABRPR Reactive (A) 09/27/2023   LABRPR Non Reactive 06/27/2023   LABRPR NON REACTIVE 06/09/2023    STI Results GC CT  Latest Ref Rng & Units  Negative  10/23/2023  3:06 PM  Negative   09/27/2023  3:50 PM  Negative   06/27/2023  4:20 PM  Negative    06/09/2023  4:49 AM Negative  Negative   04/08/2023  3:21 PM  Negative   03/28/2023  3:03 PM  Negative   01/09/2023  1:30 PM Negative  Negative   12/06/2022  3:47 PM  Negative   10/14/2022  3:55 PM Negative  Negative   09/07/2022  4:25 PM  CANCELED   09/07/2022  4:17 PM  Negative   08/20/2022  1:31 AM Positive  Negative   05/18/2022  4:27 PM  Negative   02/16/2022  2:46 PM  Negative   11/17/2021  3:30 PM  Negative   11/02/2021  3:32 AM Negative  Negative   03/31/2021  5:30 AM Negative  Negative   02/27/2020  6:22 PM Negative    Negative  Negative    Negative   10/15/2019 12:28 PM Positive    Negative  Negative    Negative   06/20/2019 12:00 AM Negative  Negative     Assessment: Presented to primary care 09/27/2023 for STI testing, 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 Previously negative RPR in December 2024. PCP sent patient to the ED for treatment and he received penicillin G 2.4 million units x1 on 09/30/2022. After treatment, develop chancer that later resolved on its own. Presented to PCP again on 10/23/2023 for STD testing, worried about treatment failure because  of chancer. At that visit, RPR titer decreased to 1:16 (~3 weeks after treatment), which is reassuring as he did receive appropriate treatment for syphilis.  No new symptoms today. Patient reports abstaining from sex >2 weeks after penicillin treatment. Explained to patient that titer decrease was as reassuring within that timeframe, but titer not expected to show four-fold decrease or possible seroconversion for at least 6 months. No indication to recheck RPR today as it measures antibodies, not bacterial infection load, and will take months to reflect recent treatment. Spoke with Gibson Kurtz, NP, and she agrees that patient does not need additional treatment.  Requested HIV viral load today for definitive rule out. Antibody negative on 10/23/2022. Patient has Descovy at home, but stopped taking around time of surgery.  Discussed re-starting PrEP today, but patient would like to confirm negative test with RNA viral load first.  Plan: - HIV RNA today - Recheck RPR 6 months after treatment for four-fold decrease of RPR  Kristopher Pheasant PharmD Candidate

## 2023-10-30 NOTE — Telephone Encounter (Addendum)
 Spoke with patient afternoon of 4/15. 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 Unfortunately he was unable to make his appointment with Marlan Silva, NP, today. He is s/p 1x Penicillin IM injection, which is sufficient to treat early latent syphilis. He does, however report concerns for a new chancre since treatment, as he has had an encounter with the same partner since his treatment. Also reports feeling achy, with arm soreness. Will schedule him with pharmacy team at [facility] on 4/16 for STI testing and likely empiric re-treatment for early latent/primary syphilis.

## 2023-11-01 LAB — HIV-1 RNA QUANT-NO REFLEX-BLD
HIV 1 RNA Quant: NOT DETECTED {copies}/mL
HIV-1 RNA Quant, Log: NOT DETECTED {Log_copies}/mL

## 2023-11-04 ENCOUNTER — Telehealth: Payer: Self-pay

## 2023-11-04 ENCOUNTER — Other Ambulatory Visit (HOSPITAL_COMMUNITY): Payer: Self-pay

## 2023-11-04 ENCOUNTER — Other Ambulatory Visit: Payer: Self-pay

## 2023-11-04 DIAGNOSIS — Z79899 Other long term (current) drug therapy: Secondary | ICD-10-CM

## 2023-11-04 MED ORDER — DESCOVY 200-25 MG PO TABS
1.0000 | ORAL_TABLET | Freq: Every day | ORAL | 2 refills | Status: AC
  Filled 2023-11-04: qty 30, 30d supply, fill #0
  Filled 2023-11-25: qty 30, 30d supply, fill #1
  Filled 2023-12-30 – 2024-01-13 (×2): qty 30, 30d supply, fill #2

## 2023-11-04 MED ORDER — DOXYCYCLINE HYCLATE 100 MG PO TABS
ORAL_TABLET | ORAL | 1 refills | Status: AC
  Filled 2023-11-04: qty 30, 15d supply, fill #0
  Filled 2023-11-25: qty 30, 15d supply, fill #1

## 2023-11-04 NOTE — Progress Notes (Signed)
 Ship 04/22, did not make the courier. Left patient a voicemail.

## 2023-11-04 NOTE — Telephone Encounter (Signed)
 Called patient to discuss negative HIV RNA and discuss restarting Descovy  for HIV PrEP. Also discussed doxyPEP for prevention of STIs. Counseled on proper dose, administration, use, and side effects. Patient verbalized understanding with no questions.   Descovy  and doxycycline  sent to [facility] for delivery.  Kristopher Pheasant PharmD Candidate

## 2023-11-04 NOTE — Progress Notes (Signed)
 Specialty Pharmacy Initial Fill Coordination Note  Dwayne Sandoval is a 28 y.o. adult contacted today regarding initial fill of specialty medication(s) Emtricitabine -Tenofovir  AF (Descovy )   Patient requested Delivery   Delivery date: 11/05/23   Verified address: [address] [place] [address]   Medication will be filled on 11/04/23.   Patient is aware of 0.00 copayment.

## 2023-11-05 ENCOUNTER — Other Ambulatory Visit: Payer: Self-pay

## 2023-11-08 ENCOUNTER — Other Ambulatory Visit: Payer: Self-pay | Admitting: Pharmacist

## 2023-11-08 NOTE — Progress Notes (Signed)
 Specialty Pharmacy Initiation Note   Dwayne Sandoval is a 28 y.o. adult who will be followed by the specialty pharmacy service for RxSp HIV    Review of administration, indication, effectiveness, safety, potential side effects, storage/disposable, and missed dose instructions occurred today for patient's specialty medication(s) Emtricitabine -Tenofovir  AF (Descovy )     Patient/Caregiver did not have any additional questions or concerns.   Patient's therapy is appropriate to: Continue    Goals Addressed             This Visit's Progress    Maintain optimal adherence to therapy   Improving    Patient is initiating therapy. Patient will be evaluated at upcoming provider appointment to assess progress      Practice safe sex   Improving    Patient is on track. Patient will be evaluated at upcoming provider appointment to assess progress         Sonya Duster Specialty Pharmacist

## 2023-11-20 ENCOUNTER — Other Ambulatory Visit: Payer: Self-pay

## 2023-11-25 ENCOUNTER — Other Ambulatory Visit: Payer: Self-pay

## 2023-11-25 NOTE — Progress Notes (Signed)
 Specialty Pharmacy Refill Coordination Note  Dwayne Sandoval is a 28 y.o. adult contacted today regarding refills of specialty medication(s) Emtricitabine -Tenofovir  AF (Descovy )   Patient requested Delivery   Delivery date: 11/29/23 (mail 5.16 if insurance will not pay, patient aware medication may not be delivered until 5.19)   Verified address: [address] [place] [address]   Medication will be filled on 05.15.25.

## 2023-11-28 ENCOUNTER — Other Ambulatory Visit: Payer: Self-pay

## 2023-12-26 ENCOUNTER — Ambulatory Visit: Payer: Self-pay | Admitting: Nurse Practitioner

## 2023-12-26 ENCOUNTER — Other Ambulatory Visit: Payer: Self-pay

## 2023-12-30 ENCOUNTER — Other Ambulatory Visit: Payer: Self-pay

## 2023-12-30 ENCOUNTER — Encounter: Payer: Self-pay | Admitting: Nurse Practitioner

## 2023-12-30 ENCOUNTER — Ambulatory Visit (INDEPENDENT_AMBULATORY_CARE_PROVIDER_SITE_OTHER): Payer: Self-pay | Admitting: Nurse Practitioner

## 2023-12-30 VITALS — BP 115/85 | HR 74 | Temp 98.5°F | Wt 156.6 lb

## 2023-12-30 DIAGNOSIS — Z0000 Encounter for general adult medical examination without abnormal findings: Secondary | ICD-10-CM

## 2023-12-30 DIAGNOSIS — Z113 Encounter for screening for infections with a predominantly sexual mode of transmission: Secondary | ICD-10-CM | POA: Diagnosis not present

## 2023-12-30 NOTE — Progress Notes (Signed)
   Subjective   Patient ID: Dwayne Sandoval, adult    DOB: 22-Feb-1996, 28 y.o.   MRN: [id number]  Chief Complaint  Patient presents with   Medical Management of Chronic Issues    Referring provider: Jerrlyn Morel, NP  Dwayne Sandoval is a 28 y.o. 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  He did recently test positive for syphilis and was seen in the ED for treatment.  He has followed with infectious disease. We will recheck STD screening today. Denies f/c/s, n/v/d, hemoptysis, PND, leg swelling. Denies chest pain or edema.   No Known Allergies   There is no immunization history on file for this patient.  Tobacco History: Social History   Tobacco Use  Smoking Status Never   Passive exposure: Yes  Smokeless Tobacco Never   Counseling given: Not Answered   Outpatient Encounter Medications as of 12/30/2023  Medication Sig   emtricitabine -tenofovir  AF (DESCOVY ) 200-25 MG tablet Take 1 tablet by mouth daily.   doxycycline  (VIBRA -TABS) 100 MG tablet Take two tablets (200 mg) by mouth 24-72 hours after sex (Patient not taking: Reported on 12/30/2023)   No facility-administered encounter medications on file as of 12/30/2023.    Review of Systems  Review of Systems  Constitutional: Negative.   HENT: Negative.    Cardiovascular: Negative.   Gastrointestinal: Negative.   Allergic/Immunologic: Negative.   Neurological: Negative.   Psychiatric/Behavioral: Negative.       Objective:   BP 115/85   Pulse 74   Temp 98.5 F (36.9 C) (Oral)   Wt 156 lb 9.6 oz (71 kg)   BMI 22.47 kg/m   Wt Readings from Last 5 Encounters:  12/30/23 156 lb 9.6 oz (71 kg)  10/23/23 153 lb 3.2 oz (69.5 kg)  09/27/23 154 lb (69.9 kg)  08/29/23 155 lb (70.3 kg)  06/27/23 155 lb 9.6 oz (70.6 kg)     Physical Exam Vitals and nursing note reviewed.  Constitutional:      General: He is not in  acute distress.    Appearance: He is well-developed.   Cardiovascular:     Rate and Rhythm: Normal rate and regular rhythm.  Pulmonary:     Effort: Pulmonary effort is normal.     Breath sounds: Normal breath sounds.   Skin:    General: Skin is warm and dry.   Neurological:     Mental Status: He is alert and oriented to person, place, and time.       Assessment & Plan:   Routine adult health maintenance -     CBC -     Comprehensive metabolic panel with GFR  Screen for STD (sexually transmitted disease) -     Chlamydia/Gonococcus/Trichomonas, NAA -     RPR+HIV+GC+CT Panel     Return in about 3 months (around 03/31/2024).   Jerrlyn Morel, NP 12/30/2023

## 2023-12-31 ENCOUNTER — Other Ambulatory Visit (HOSPITAL_COMMUNITY): Payer: Self-pay

## 2023-12-31 LAB — CHLAMYDIA/GONOCOCCUS/TRICHOMONAS, NAA
Chlamydia by NAA: NEGATIVE
Gonococcus by NAA: NEGATIVE
Trich vag by NAA: NEGATIVE

## 2024-01-01 ENCOUNTER — Other Ambulatory Visit: Payer: Self-pay

## 2024-01-01 ENCOUNTER — Other Ambulatory Visit

## 2024-01-01 ENCOUNTER — Ambulatory Visit: Payer: Self-pay | Admitting: Nurse Practitioner

## 2024-01-01 DIAGNOSIS — Z113 Encounter for screening for infections with a predominantly sexual mode of transmission: Secondary | ICD-10-CM

## 2024-01-01 DIAGNOSIS — Z0000 Encounter for general adult medical examination without abnormal findings: Secondary | ICD-10-CM

## 2024-01-02 LAB — COMPREHENSIVE METABOLIC PANEL WITH GFR
ALT: 15 IU/L (ref 0–44)
BUN/Creatinine Ratio: 10 (ref 9–20)
CO2: 22 mmol/L (ref 20–29)
Globulin, Total: 2.9 g/dL (ref 1.5–4.5)
Glucose: 83 mg/dL (ref 70–99)
Potassium: 4.7 mmol/L (ref 3.5–5.2)
Sodium: 142 mmol/L (ref 134–144)
Total Protein: 7.6 g/dL (ref 6.0–8.5)

## 2024-01-02 LAB — CBC
MCH: 28.9 pg (ref 26.6–33.0)
RBC: 5.6 x10E6/uL (ref 4.14–5.80)
WBC: 4.2 10*3/uL (ref 3.4–10.8)

## 2024-01-05 LAB — COMPREHENSIVE METABOLIC PANEL WITH GFR
AST: 19 IU/L (ref 0–40)
Albumin: 4.7 g/dL (ref 4.3–5.2)
Alkaline Phosphatase: 102 IU/L (ref 44–121)
BUN: 10 mg/dL (ref 6–20)
Bilirubin Total: 0.8 mg/dL (ref 0.0–1.2)
Calcium: 9.9 mg/dL (ref 8.7–10.2)
Chloride: 104 mmol/L (ref 96–106)
Creatinine, Ser: 1.01 mg/dL (ref 0.76–1.27)
eGFR: 106 mL/min/{1.73_m2} (ref >59)

## 2024-01-05 LAB — RPR+HIV+GC+CT PANEL
HIV Screen 4th Generation wRfx: NONREACTIVE
RPR Ser Ql: REACTIVE — AB

## 2024-01-05 LAB — CBC
Hematocrit: 49.1 % (ref 37.5–51.0)
Hemoglobin: 16.2 g/dL (ref 13.0–17.7)
MCHC: 33 g/dL (ref 31.5–35.7)
MCV: 88 fL (ref 79–97)
Platelets: 363 10*3/uL (ref 150–450)
RDW: 13 % (ref 11.6–15.4)

## 2024-01-05 LAB — RPR, QUANT. (REFLEX): Rapid Plasma Reagin, Quant: 1:2 {titer} — ABNORMAL HIGH

## 2024-01-09 ENCOUNTER — Telehealth: Payer: Self-pay

## 2024-01-09 NOTE — Telephone Encounter (Signed)
 The patient called in for results but then decided to look at my chart while on the phone and got the results and was relieved concerning HIV.

## 2024-01-09 NOTE — Telephone Encounter (Signed)
 Received call from [facility] requesting additional treatment for syphilis.   Reviewed with patient that no additional treatment is indicated. Discussed that she had positive RPR titer 1:32 in March 2025 with a recent non-reactive test in December 2024 indicating early infection.   She reports she feels she was not adequately treated because she only got one shot in the ED and was not given any further treatment at her appointment with our office in April.  Reviewed ED documentation and discussed that while she only received one injection, she did get the full 2.4 million unit dose, which is appropriate treatment for early infection.  She is concerned that her RPR still says reactive and was advised that since she has previously reverted back to non-reactive that she definitely would this time.   Discussed that it can take up to 2 years to revert to non-reactive and that she was likely re-tested too soon. Also relayed that some people will never go back to non-reactive and that this does not indicate ongoing infection, only an immune response to the infection.   She says she is uncomfortable with having a titer of 1:2 and will seek additional treatment at [facility], [facility], and the health department every day if she has to. Provided reassurance that her titer has decreased > 4-fold, indicating successful treatment.  She states that she will not be satisfied until she receives additional treatment.   Milen Lengacher, BSN, RN

## 2024-01-13 ENCOUNTER — Other Ambulatory Visit: Payer: Self-pay | Admitting: Pharmacist

## 2024-01-13 ENCOUNTER — Other Ambulatory Visit (HOSPITAL_COMMUNITY): Payer: Self-pay

## 2024-01-13 ENCOUNTER — Other Ambulatory Visit: Payer: Self-pay

## 2024-01-13 DIAGNOSIS — Z79899 Other long term (current) drug therapy: Secondary | ICD-10-CM

## 2024-01-13 NOTE — Progress Notes (Signed)
 Specialty Pharmacy Refill Coordination Note  Dwayne Sandoval is a 28 y.o. adult contacted today regarding refills of specialty medication(s) Emtricitabine -Tenofovir  AF (Descovy )   Patient requested Delivery   Delivery date: 01/16/24   Verified address: [address] [place] [address]   Medication will be filled on 01/15/24.

## 2024-01-13 NOTE — Telephone Encounter (Signed)
 If patient calls for an appointment, please schedule with a provider as pharmacy has had this conversation with her several times, and I think it will be better coming from a provider. Thank you!  Danyetta Gillham L. Gelene Recktenwald, PharmD, BCIDP, AAHIVP, CPP Infectious Diseases Clinical Pharmacist Practitioner Clinical Pharmacist Lead, Specialty Pharmacy [facility] for Infectious Disease 01/13/2024, 3:02 PM

## 2024-01-29 NOTE — Progress Notes (Deleted)
   HPI: Krishay Faro is a 28 y.o. adult who presents to the [facility] pharmacy clinic for HIV PrEP follow-up.  Insured   []    Uninsured  []    Patient Active Problem List   Diagnosis Date Noted   HIV exposure 04/12/2023   Lipid screening 03/28/2023   Screen for STD (sexually transmitted disease) 12/06/2022   Screening for STD (sexually transmitted disease) 10/03/2022   Routine screening for STI (sexually transmitted infection) 02/16/2022   Monkeypox 04/13/2021   H/O gonorrhea 04/13/2021   High risk sexual behavior 07/30/2018    Patient's Medications  New Prescriptions   No medications on file  Previous Medications   DOXYCYCLINE  (VIBRA -TABS) 100 MG TABLET    Take two tablets (200 mg) by mouth 24-72 hours after sex   EMTRICITABINE -TENOFOVIR  AF (DESCOVY ) 200-25 MG TABLET    Take 1 tablet by mouth daily.  Modified Medications   No medications on file  Discontinued Medications   No medications on file        No data to display          Labs:  SCr: Lab Results  Component Value Date   CREATININE 1.01 01/01/2024   CREATININE 1.01 06/27/2023   CREATININE 0.94 04/26/2023   CREATININE 1.01 01/12/2023   CREATININE 0.94 10/14/2022   HIV Lab Results  Component Value Date   HIV Non Reactive 01/01/2024   HIV Non Reactive 10/23/2023   HIV Non Reactive 09/27/2023   HIV Non Reactive 06/27/2023   HIV Non Reactive 06/09/2023   Hepatitis B Lab Results  Component Value Date   HEPBSAG NON-REACTIVE 04/17/2023   Hepatitis C No results found for: HEPCAB, HCVRNAPCRQN Hepatitis A No results found for: HAV RPR and STI Lab Results  Component Value Date   LABRPR Reactive (A) 01/01/2024   LABRPR Reactive (A) 10/23/2023   LABRPR Reactive (A) 09/27/2023   LABRPR Reactive (A) 09/27/2023   LABRPR Non Reactive 06/27/2023    STI Results GC CT  Latest Ref Rng & Units  Negative  01/01/2024  3:27 PM  Negative   10/23/2023  3:06 PM  Negative   09/27/2023  3:50 PM  Negative    06/27/2023  4:20 PM  Negative   06/09/2023  4:49 AM Negative  Negative   04/08/2023  3:21 PM  Negative   03/28/2023  3:03 PM  Negative   01/09/2023  1:30 PM Negative  Negative   12/06/2022  3:47 PM  Negative   10/14/2022  3:55 PM Negative  Negative   09/07/2022  4:25 PM  CANCELED   09/07/2022  4:17 PM  Negative   08/20/2022  1:31 AM Positive  Negative   05/18/2022  4:27 PM  Negative   02/16/2022  2:46 PM  Negative   11/17/2021  3:30 PM  Negative   11/02/2021  3:32 AM Negative  Negative   03/31/2021  5:30 AM Negative  Negative   02/27/2020  6:22 PM Negative    Negative  Negative    Negative   10/15/2019 12:28 PM Positive    Negative  Negative    Negative     Assessment: Makarios is here today for his 3 month HIV PrEP follow up appointment.   Plan: ***  Aasha Dina L. Hitoshi Werts, PharmD, BCIDP, AAHIVP, CPP Clinical Pharmacist Practitioner - Infectious Diseases Clinical Pharmacist Lead - Specialty Pharmacy [facility] for Infectious Disease

## 2024-02-03 ENCOUNTER — Other Ambulatory Visit: Payer: Self-pay

## 2024-02-03 ENCOUNTER — Ambulatory Visit: Admitting: Pharmacist

## 2024-02-04 ENCOUNTER — Other Ambulatory Visit: Payer: Self-pay

## 2024-02-05 ENCOUNTER — Other Ambulatory Visit (HOSPITAL_COMMUNITY): Payer: Self-pay

## 2024-02-05 ENCOUNTER — Other Ambulatory Visit: Payer: Self-pay | Admitting: Pharmacist

## 2024-02-05 DIAGNOSIS — Z79899 Other long term (current) drug therapy: Secondary | ICD-10-CM

## 2024-02-11 ENCOUNTER — Other Ambulatory Visit: Payer: Self-pay

## 2024-02-13 ENCOUNTER — Other Ambulatory Visit: Payer: Self-pay

## 2024-02-13 ENCOUNTER — Other Ambulatory Visit: Payer: Self-pay | Admitting: Pharmacist

## 2024-02-13 DIAGNOSIS — Z79899 Other long term (current) drug therapy: Secondary | ICD-10-CM

## 2024-02-14 ENCOUNTER — Other Ambulatory Visit: Payer: Self-pay

## 2024-02-19 ENCOUNTER — Other Ambulatory Visit (HOSPITAL_COMMUNITY): Payer: Self-pay

## 2024-03-06 ENCOUNTER — Other Ambulatory Visit: Payer: Self-pay

## 2024-03-31 ENCOUNTER — Encounter: Payer: Self-pay | Admitting: Nurse Practitioner

## 2024-03-31 ENCOUNTER — Ambulatory Visit (INDEPENDENT_AMBULATORY_CARE_PROVIDER_SITE_OTHER): Payer: Self-pay | Admitting: Nurse Practitioner

## 2024-03-31 VITALS — BP 125/95 | HR 97 | Wt 158.4 lb

## 2024-03-31 DIAGNOSIS — F419 Anxiety disorder, unspecified: Secondary | ICD-10-CM

## 2024-03-31 DIAGNOSIS — F32A Depression, unspecified: Secondary | ICD-10-CM

## 2024-03-31 DIAGNOSIS — Z139 Encounter for screening, unspecified: Secondary | ICD-10-CM

## 2024-03-31 MED ORDER — HYDROXYZINE HCL 10 MG PO TABS: 10.0000 mg | ORAL_TABLET | Freq: Three times a day (TID) | ORAL | 0 refills | Status: AC | PRN

## 2024-03-31 NOTE — Patient Instructions (Signed)

## 2024-03-31 NOTE — Progress Notes (Signed)
 Subjective   Patient ID: Dwayne Sandoval, adult    DOB: 19-Jan-1996, 28 y.o.   MRN: [id number]  Chief Complaint  Patient presents with   Medical Management of Chronic Issues    Patient stated that last night he had to call the police on his self last night, because he had a panic attach     Referring provider: Oley Dwayne RAMAN, NP  Dwayne Sandoval is a 28 y.o. 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  Patient states that last night she had to call the police due to a mental breakdown.  Patient was having a severe panic attack.  States that this was due to recent loss of job at [employer].  Patient has not been able to find employment since losing that job and is now having financial strain.  We did offer food from the food pantry but patient declines at this time but states that she would return if needed.  We will trial hydroxyzine  for anxiety and place a referral for patient to psychiatry for therapy and medication management. Denies f/c/s, n/v/d, hemoptysis, PND, leg swelling Denies chest pain or edema     No Known Allergies   There is no immunization history on file for this patient.  Tobacco History: Social History   Tobacco Use  Smoking Status Never   Passive exposure: Yes  Smokeless Tobacco Never   Counseling given: Not Answered   Outpatient Encounter Medications as of 03/31/2024  Medication Sig   emtricitabine -tenofovir  AF (DESCOVY ) 200-25 MG tablet Take 1 tablet by mouth daily.   hydrOXYzine  (ATARAX ) 10 MG tablet Take 1 tablet (10 mg total) by mouth 3 (three) times daily as needed.   doxycycline  (VIBRA -TABS) 100 MG tablet Take two tablets (200 mg) by mouth 24-72 hours after sex (Patient not taking: Reported on 12/30/2023)   No facility-administered encounter medications on file as of 03/31/2024.    Review of Systems  Review of Systems  Constitutional: Negative.    HENT: Negative.    Cardiovascular: Negative.   Gastrointestinal: Negative.   Allergic/Immunologic: Negative.   Neurological: Negative.   Psychiatric/Behavioral: Negative.       Objective:   BP (!) 125/95   Pulse 97   Wt 158 lb 6.4 oz (71.8 kg)   SpO2 100%   BMI 22.73 kg/m   Wt Readings from Last 5 Encounters:  03/31/24 158 lb 6.4 oz (71.8 kg)  12/30/23 156 lb 9.6 oz (71 kg)  10/23/23 153 lb 3.2 oz (69.5 kg)  09/27/23 154 lb (69.9 kg)  08/29/23 155 lb (70.3 kg)     Physical Exam Vitals and nursing note reviewed.  Constitutional:      General: He is not in acute distress.    Appearance: He is well-developed.  Cardiovascular:     Rate and Rhythm: Normal rate and regular rhythm.  Pulmonary:     Effort: Pulmonary effort is normal.     Breath sounds: Normal breath sounds.  Skin:    General: Skin is warm and dry.  Neurological:     Mental Status: He is alert and oriented to person, place, and time.       Assessment & Plan:   Encounter for screening involving social determinants of health (SDoH) -     AMB Referral VBCI Care Management  Anxiety and depression -     hydrOXYzine  HCl; Take 1 tablet (10 mg  total) by mouth 3 (three) times daily as needed.  Dispense: 30 tablet; Refill: 0 -     Ambulatory referral to Psychiatry     Return in about 4 weeks (around 04/28/2024) for anxiety.   Dwayne GORMAN Borer, NP 03/31/2024

## 2024-04-01 ENCOUNTER — Telehealth: Payer: Self-pay

## 2024-04-01 ENCOUNTER — Ambulatory Visit: Payer: Self-pay | Admitting: Nurse Practitioner

## 2024-04-01 NOTE — Progress Notes (Signed)
 Complex Care Management Note Care Guide Note  04/01/2024 Name: Dwayne Sandoval MRN: [id number] DOB: 1996-04-06   Complex Care Management Outreach Attempts: An unsuccessful telephone outreach was attempted today to offer the patient information about available complex care management services.  Follow Up Plan:  Additional outreach attempts will be made to offer the patient complex care management information and services.   Encounter Outcome:  No Answer  Chanique Duca [facility]  [facility] Guide Direct Dial: [phone]  Fax: [phone] Website: [email or web address]

## 2024-04-03 ENCOUNTER — Telehealth: Payer: Self-pay

## 2024-04-03 NOTE — Progress Notes (Signed)
 Complex Care Management Note Care Guide Note  04/03/2024 Name: Dwayne Sandoval MRN: [id number] DOB: 1996-02-14  [name] is a 28 y.o. year old adult who is a primary care patient of Oley Bascom RAMAN, NP . The community resource team was consulted for assistance with Food Insecurity  SDOH screenings and interventions completed:  Yes  Social Drivers of Health From This Encounter   Food Insecurity: No Food Insecurity (04/03/2024)   Hunger Vital Sign    Worried About Running Out of Food in the Last Year: Never true    Ran Out of Food in the Last Year: Never true  Housing: Unknown (04/03/2024)   Housing Stability Vital Sign    Unable to Pay for Housing in the Last Year: Patient declined    Number of Times Moved in the Last Year: 0    Homeless in the Last Year: Patient declined  Financial Resource Strain: Patient Declined (04/03/2024)   Overall Financial Resource Strain (CARDIA)    Difficulty of Paying Living Expenses: Patient declined  Transportation Needs: No Transportation Needs (04/03/2024)   PRAPARE - Administrator, Civil Service (Medical): No    Lack of Transportation (Non-Medical): No  Utilities: Not At Risk (04/03/2024)   Utilities    Threatened with loss of utilities: No    SDOH Interventions Today    Flowsheet Row Most Recent Value  SDOH Interventions   Food Insecurity Interventions Other (Comment), [employer] stated he has applied for SNAP benefits and is waiting for approval. Verified email address to send food pantry list via FindHelp.]  Housing Interventions Patient Declined  Financial Strain Interventions Patient Declined     Care guide performed the following interventions: Patient provided with information about care guide support team and interviewed to confirm resource needs.  Follow Up Plan:  No further follow up planned at this time. The patient has been provided with needed resources.  Encounter Outcome:  Patient  Visit Completed  Cayenne Breault [facility]  [facility] Guide Direct Dial: [phone]  Fax: [phone] Website: [email or web address]

## 2024-04-13 ENCOUNTER — Other Ambulatory Visit: Payer: Self-pay

## 2024-04-15 ENCOUNTER — Other Ambulatory Visit: Payer: Self-pay | Admitting: Nurse Practitioner

## 2024-04-15 DIAGNOSIS — Z113 Encounter for screening for infections with a predominantly sexual mode of transmission: Secondary | ICD-10-CM

## 2024-04-16 ENCOUNTER — Other Ambulatory Visit: Payer: Self-pay

## 2024-04-16 DIAGNOSIS — Z113 Encounter for screening for infections with a predominantly sexual mode of transmission: Secondary | ICD-10-CM

## 2024-04-20 ENCOUNTER — Ambulatory Visit: Payer: Self-pay | Admitting: Nurse Practitioner

## 2024-04-20 LAB — RPR+HIV+GC+CT PANEL
Chlamydia trachomatis, NAA: NEGATIVE
HIV Screen 4th Generation wRfx: NONREACTIVE
Neisseria Gonorrhoeae by PCR: NEGATIVE
RPR Ser Ql: NONREACTIVE

## 2024-04-20 LAB — CHLAMYDIA/GONOCOCCUS/TRICHOMONAS, NAA
Chlamydia by NAA: NEGATIVE
Gonococcus by NAA: NEGATIVE
Trich vag by NAA: NEGATIVE

## 2024-04-23 ENCOUNTER — Other Ambulatory Visit: Payer: Self-pay

## 2024-04-29 ENCOUNTER — Ambulatory Visit: Payer: Self-pay | Admitting: Nurse Practitioner

## 2024-06-17 ENCOUNTER — Ambulatory Visit: Payer: Self-pay | Admitting: Nurse Practitioner

## 2024-06-18 ENCOUNTER — Telehealth: Payer: Self-pay | Admitting: *Deleted

## 2024-06-18 NOTE — Telephone Encounter (Signed)
 Copied from CRM [id number]. Topic: Clinical - Request for Lab/Test Order >> Jun 17, 2024  4:46 PM Ashley R wrote: Reason for CRM: Requesting labs, every 3 months for December   ----------------------------------------------------------------------- From previous Reason for Contact - Scheduling: Patient/patient representative is calling to schedule an appointment. Refer to attachments for appointment information.

## 2024-08-10 ENCOUNTER — Other Ambulatory Visit: Payer: Self-pay
